# Patient Record
Sex: Female | Born: 1935 | Race: White | Hispanic: No | Smoking: Never smoker
Health system: Southern US, Community
[De-identification: ages and names within clinical notes are randomized; demographics above are authoritative.]

## PROBLEM LIST (undated history)

## (undated) DIAGNOSIS — W010XXA Fall on same level from slipping, tripping and stumbling without subsequent striking against object, initial encounter: Secondary | ICD-10-CM

## (undated) DIAGNOSIS — Z9889 Other specified postprocedural states: Secondary | ICD-10-CM

## (undated) DIAGNOSIS — I4891 Unspecified atrial fibrillation: Secondary | ICD-10-CM

## (undated) DIAGNOSIS — I10 Essential (primary) hypertension: Secondary | ICD-10-CM

## (undated) DIAGNOSIS — E079 Disorder of thyroid, unspecified: Secondary | ICD-10-CM

## (undated) DIAGNOSIS — C801 Malignant (primary) neoplasm, unspecified: Secondary | ICD-10-CM

## (undated) DIAGNOSIS — I6529 Occlusion and stenosis of unspecified carotid artery: Secondary | ICD-10-CM

## (undated) DIAGNOSIS — M1990 Unspecified osteoarthritis, unspecified site: Secondary | ICD-10-CM

## (undated) DIAGNOSIS — R112 Nausea with vomiting, unspecified: Secondary | ICD-10-CM

## (undated) DIAGNOSIS — I639 Cerebral infarction, unspecified: Secondary | ICD-10-CM

## (undated) DIAGNOSIS — B351 Tinea unguium: Secondary | ICD-10-CM

## (undated) HISTORY — DX: Disorder of thyroid, unspecified: E07.9

## (undated) HISTORY — PX: CATARACT EXTRACTION: SUR2

## (undated) HISTORY — DX: Occlusion and stenosis of unspecified carotid artery: I65.29

## (undated) HISTORY — PX: REPLACEMENT TOTAL KNEE BILATERAL: SUR1225

## (undated) HISTORY — PX: APPENDECTOMY: SHX54

## (undated) HISTORY — DX: Tinea unguium: B35.1

## (undated) HISTORY — DX: Cerebral infarction, unspecified: I63.9

## (undated) HISTORY — DX: Essential (primary) hypertension: I10

## (undated) HISTORY — DX: Fall on same level from slipping, tripping and stumbling without subsequent striking against object, initial encounter: W01.0XXA

## (undated) HISTORY — DX: Unspecified atrial fibrillation: I48.91

## (undated) HISTORY — PX: MASTECTOMY: SHX3

## (undated) HISTORY — PX: ABDOMINAL HYSTERECTOMY: SHX81

## (undated) HISTORY — DX: Unspecified osteoarthritis, unspecified site: M19.90

## (undated) HISTORY — PX: HERNIA REPAIR: SHX51

## (undated) HISTORY — DX: Malignant (primary) neoplasm, unspecified: C80.1

## (undated) HISTORY — PX: COLON SURGERY: SHX602

---

## 1997-12-15 ENCOUNTER — Other Ambulatory Visit: Admission: RE | Admit: 1997-12-15 | Discharge: 1997-12-15 | Payer: Self-pay | Admitting: Oncology

## 1998-04-14 ENCOUNTER — Ambulatory Visit (HOSPITAL_COMMUNITY): Admission: RE | Admit: 1998-04-14 | Discharge: 1998-04-14 | Payer: Self-pay | Admitting: Interventional Cardiology

## 1998-04-14 ENCOUNTER — Encounter: Payer: Self-pay | Admitting: Interventional Cardiology

## 1998-06-27 ENCOUNTER — Ambulatory Visit (HOSPITAL_COMMUNITY): Admission: RE | Admit: 1998-06-27 | Discharge: 1998-06-28 | Payer: Self-pay | Admitting: General Surgery

## 1998-06-27 ENCOUNTER — Encounter: Payer: Self-pay | Admitting: General Surgery

## 1999-10-06 ENCOUNTER — Encounter: Payer: Self-pay | Admitting: *Deleted

## 1999-10-06 ENCOUNTER — Encounter: Admission: RE | Admit: 1999-10-06 | Discharge: 1999-10-06 | Payer: Self-pay | Admitting: *Deleted

## 2000-01-30 ENCOUNTER — Encounter: Payer: Self-pay | Admitting: *Deleted

## 2000-01-30 ENCOUNTER — Encounter: Admission: RE | Admit: 2000-01-30 | Discharge: 2000-01-30 | Payer: Self-pay | Admitting: *Deleted

## 2000-11-26 ENCOUNTER — Other Ambulatory Visit: Admission: RE | Admit: 2000-11-26 | Discharge: 2000-11-26 | Payer: Self-pay | Admitting: Internal Medicine

## 2001-02-05 ENCOUNTER — Encounter: Payer: Self-pay | Admitting: Internal Medicine

## 2001-02-05 ENCOUNTER — Encounter: Admission: RE | Admit: 2001-02-05 | Discharge: 2001-02-05 | Payer: Self-pay | Admitting: Internal Medicine

## 2002-02-06 ENCOUNTER — Encounter: Admission: RE | Admit: 2002-02-06 | Discharge: 2002-02-06 | Payer: Self-pay | Admitting: Internal Medicine

## 2002-02-06 ENCOUNTER — Encounter: Payer: Self-pay | Admitting: Internal Medicine

## 2003-02-15 ENCOUNTER — Encounter: Payer: Self-pay | Admitting: Internal Medicine

## 2003-02-15 ENCOUNTER — Encounter: Admission: RE | Admit: 2003-02-15 | Discharge: 2003-02-15 | Payer: Self-pay | Admitting: Internal Medicine

## 2003-08-21 HISTORY — PX: JOINT REPLACEMENT: SHX530

## 2003-10-06 ENCOUNTER — Inpatient Hospital Stay (HOSPITAL_COMMUNITY): Admission: RE | Admit: 2003-10-06 | Discharge: 2003-10-11 | Payer: Self-pay | Admitting: Specialist

## 2004-02-16 ENCOUNTER — Encounter: Admission: RE | Admit: 2004-02-16 | Discharge: 2004-02-16 | Payer: Self-pay | Admitting: Internal Medicine

## 2004-06-21 ENCOUNTER — Inpatient Hospital Stay (HOSPITAL_COMMUNITY): Admission: RE | Admit: 2004-06-21 | Discharge: 2004-06-25 | Payer: Self-pay | Admitting: Specialist

## 2005-03-01 ENCOUNTER — Encounter: Admission: RE | Admit: 2005-03-01 | Discharge: 2005-03-01 | Payer: Self-pay | Admitting: Internal Medicine

## 2005-12-19 ENCOUNTER — Encounter: Payer: Self-pay | Admitting: Interventional Cardiology

## 2006-03-04 ENCOUNTER — Encounter: Admission: RE | Admit: 2006-03-04 | Discharge: 2006-03-04 | Payer: Self-pay | Admitting: Internal Medicine

## 2007-03-06 ENCOUNTER — Encounter: Admission: RE | Admit: 2007-03-06 | Discharge: 2007-03-06 | Payer: Self-pay | Admitting: Internal Medicine

## 2008-03-08 ENCOUNTER — Encounter: Admission: RE | Admit: 2008-03-08 | Discharge: 2008-03-08 | Payer: Self-pay | Admitting: Internal Medicine

## 2009-03-09 ENCOUNTER — Encounter: Admission: RE | Admit: 2009-03-09 | Discharge: 2009-03-09 | Payer: Self-pay | Admitting: Internal Medicine

## 2010-03-10 ENCOUNTER — Encounter: Admission: RE | Admit: 2010-03-10 | Discharge: 2010-03-10 | Payer: Self-pay | Admitting: Internal Medicine

## 2010-08-20 HISTORY — PX: CAROTID ENDARTERECTOMY: SUR193

## 2010-11-10 ENCOUNTER — Emergency Department (HOSPITAL_COMMUNITY)
Admission: EM | Admit: 2010-11-10 | Discharge: 2010-11-10 | Disposition: A | Discharge status: Home / Self Care | Payer: Medicare Other | Attending: Emergency Medicine | Admitting: Emergency Medicine

## 2010-11-10 ENCOUNTER — Emergency Department (HOSPITAL_COMMUNITY): Payer: Medicare Other

## 2010-11-10 DIAGNOSIS — I509 Heart failure, unspecified: Secondary | ICD-10-CM | POA: Insufficient documentation

## 2010-11-10 DIAGNOSIS — Z79899 Other long term (current) drug therapy: Secondary | ICD-10-CM | POA: Insufficient documentation

## 2010-11-10 DIAGNOSIS — I10 Essential (primary) hypertension: Secondary | ICD-10-CM | POA: Insufficient documentation

## 2010-11-10 DIAGNOSIS — M545 Low back pain, unspecified: Secondary | ICD-10-CM | POA: Insufficient documentation

## 2010-11-21 ENCOUNTER — Other Ambulatory Visit: Payer: Self-pay | Admitting: Internal Medicine

## 2010-11-21 DIAGNOSIS — H34231 Retinal artery branch occlusion, right eye: Secondary | ICD-10-CM

## 2010-11-22 ENCOUNTER — Ambulatory Visit
Admission: RE | Admit: 2010-11-22 | Discharge: 2010-11-22 | Disposition: A | Discharge status: Home / Self Care | Payer: Medicare Other | Source: Ambulatory Visit | Attending: Internal Medicine | Admitting: Internal Medicine

## 2010-11-22 ENCOUNTER — Other Ambulatory Visit: Payer: Self-pay | Admitting: Internal Medicine

## 2010-11-22 DIAGNOSIS — H34231 Retinal artery branch occlusion, right eye: Secondary | ICD-10-CM

## 2010-12-01 ENCOUNTER — Other Ambulatory Visit (INDEPENDENT_AMBULATORY_CARE_PROVIDER_SITE_OTHER): Payer: Medicare Other

## 2010-12-01 DIAGNOSIS — I6529 Occlusion and stenosis of unspecified carotid artery: Secondary | ICD-10-CM

## 2010-12-04 ENCOUNTER — Encounter (INDEPENDENT_AMBULATORY_CARE_PROVIDER_SITE_OTHER): Payer: Medicare Other | Admitting: Surgery

## 2010-12-04 DIAGNOSIS — I6529 Occlusion and stenosis of unspecified carotid artery: Secondary | ICD-10-CM

## 2010-12-04 NOTE — Procedures (Unsigned)
CAROTID DUPLEX EXAM  INDICATION:  Right retinal artery occlusion, followup carotid disease.  HISTORY: Diabetes:  No. Cardiac:  No. Hypertension:  Yes. Smoking:  Previous tobacco use. Previous Surgery:  No. CV History:  Right eye blindness. Amaurosis Fugax No, Paresthesias No, Hemiparesis No                                      RIGHT             LEFT Brachial systolic pressure:         140               146 Brachial Doppler waveforms:         WNL               WNL Vertebral direction of flow:        Antegrade         Antegrade DUPLEX VELOCITIES (cm/sec) CCA peak systolic                   44                67 ECA peak systolic                   341               99 ICA peak systolic                   319               331 ICA end diastolic                   77                75 PLAQUE MORPHOLOGY:                  Heterogeneous / calcific            Heterogeneous / calcific PLAQUE AMOUNT:                      Moderate to severe                  Moderate to severe PLAQUE LOCATION:                    ICA, ECA, CCA     ICA, ECA, CCA  IMPRESSION: 1. 60% to 79% bilateral internal carotid artery stenosis by end     diastolic velocity criteria, image, ratio and peak systolic     velocity suggest worse; plaque is irregular. 2. Abnormal right external carotid artery. 3. Bilateral vertebral arteries are within normal limits.  ___________________________________________ V. Charlena Cross, MD  LT/MEDQ  D:  12/01/2010  T:  12/01/2010  Job:  [id number]

## 2010-12-05 NOTE — Assessment & Plan Note (Signed)
OFFICE VISIT  Huff, Brandy C DOB:  10-10-1917                                       12/04/2010 ZOXWR#:[id number]  REASON FOR VISIT:  Symptomatic right carotid stenosis.  HISTORY:  This is a 77 year old female I am seeing at the request of Dr. Donette Larry for right carotid stenosis in the setting of right retinal artery occlusion.  The patient has had a recent acute right vision problem. She was seen by her ophthalmologist and found to have an acute right retinal artery occlusion.  She was sent for carotid Dopplers which revealed a high-grade stenosis on the right in the 70-90 percentile. The left side is 50%-60%.  The patient states no other symptomatology. She denies numbness or weakness in either extremity.  She denies slurred speech.  She denies mentation problems.  The patient suffers from hypertension which is medically managed.  She is not on cholesterol medications.  Per the patient her cholesterol has been excellent.  She has had multiple surgeries in the past including 2 operations for colon cancer as well as breast cancer.  She is very independent.  She denies having any cardiac issues.  REVIEW OF SYSTEMS:  ENT:  Positive for recent change in eyesight. MUSCULOSKELETAL:  Positive for arthritis. All other review of systems are negative.  PAST MEDICAL HISTORY:  Hypertension, hypothyroidism, osteoarthritis, history of breast cancer, history of colon cancer, atrial fibrillation now in normal sinus rhythm, skin cancer, right retinal artery occlusion.  FAMILY HISTORY:  Positive for heart murmur in her sister.  SOCIAL HISTORY:  She dips snuff, does not drink alcohol.  ALLERGIES:  Are none.  PHYSICAL EXAMINATION:  Heart rate 87, blood pressure 121/66 on the right, 138/81 on the left, O2 sats are 97%.  General:  She is well- appearing, in no distress.  HEENT:  She has decreased to no vision in her right eye.  Extraocular movements are intact.   Tongue is midline. Lungs:  Are clear bilaterally.  Cardiovascular:  Regular rate and rhythm.  No murmur.  No carotid bruits.  Abdomen:  Soft and nontender. Her aorta is palpable but feels of normal caliber.  Musculoskeletal: Without major deformities.  Neurological:  No focal deficits except for right vision loss.  Skin:  Without rash.  DIAGNOSTIC STUDIES:  The patient comes with ultrasound from [facility].  This shows high-grade right carotid stenosis on the 70%-90% range, 50%-60% on the left.  ASSESSMENT:  Symptomatic right carotid stenosis.  PLAN:  I had a long discussion today with the patient and her daughter regarding the risks and benefits of surgery.  In the setting of her current symptoms and known high-grade right carotid stenosis, her stroke rate would be decreased if we proceed with carotid endarterectomy.  We discussed possible medical management.  However, with the risks and benefits we have decided to proceed with carotid endarterectomy on the right.  I discussed the risk of stroke, the risk of nerve injury, the risk of cardiopulmonary complications, infection and bleeding.  All of her questions were answered.  She wishes to proceed.  I have scheduled her operation for this Friday, April 20.  She is going to continue on her aspirin.    [name], MD Electronically Signed  VWB/MEDQ  D:  12/04/2010  T:  12/05/2010  Job:  [id number]  cc:   Georgann Housekeeper, MD  Dr Grayling Congress

## 2010-12-06 ENCOUNTER — Encounter (HOSPITAL_COMMUNITY)
Admission: RE | Admit: 2010-12-06 | Discharge: 2010-12-06 | Disposition: A | Discharge status: Home / Self Care | Payer: Medicare Other | Source: Ambulatory Visit | Attending: Surgery | Admitting: Surgery

## 2010-12-06 ENCOUNTER — Other Ambulatory Visit: Payer: Self-pay | Admitting: Surgery

## 2010-12-06 ENCOUNTER — Ambulatory Visit (HOSPITAL_COMMUNITY)
Admission: RE | Admit: 2010-12-06 | Discharge: 2010-12-06 | Disposition: A | Discharge status: Home / Self Care | Payer: Medicare Other | Source: Ambulatory Visit | Attending: Surgery | Admitting: Surgery

## 2010-12-06 DIAGNOSIS — I6529 Occlusion and stenosis of unspecified carotid artery: Secondary | ICD-10-CM | POA: Insufficient documentation

## 2010-12-06 DIAGNOSIS — Z01818 Encounter for other preprocedural examination: Secondary | ICD-10-CM | POA: Insufficient documentation

## 2010-12-06 DIAGNOSIS — J449 Chronic obstructive pulmonary disease, unspecified: Secondary | ICD-10-CM | POA: Insufficient documentation

## 2010-12-06 DIAGNOSIS — I6521 Occlusion and stenosis of right carotid artery: Secondary | ICD-10-CM

## 2010-12-06 DIAGNOSIS — J4489 Other specified chronic obstructive pulmonary disease: Secondary | ICD-10-CM | POA: Insufficient documentation

## 2010-12-06 DIAGNOSIS — Z01811 Encounter for preprocedural respiratory examination: Secondary | ICD-10-CM | POA: Insufficient documentation

## 2010-12-06 DIAGNOSIS — Z01812 Encounter for preprocedural laboratory examination: Secondary | ICD-10-CM | POA: Insufficient documentation

## 2010-12-06 LAB — CBC
MCH: 32.4 pg (ref 26.0–34.0)
MCV: 94.5 fL (ref 78.0–100.0)
Platelets: 187 10*3/uL (ref 150–400)
RDW: 13.6 % (ref 11.5–15.5)

## 2010-12-06 LAB — URINALYSIS, ROUTINE W REFLEX MICROSCOPIC
Bilirubin Urine: NEGATIVE
Glucose, UA: NEGATIVE mg/dL
Ketones, ur: NEGATIVE mg/dL
pH: 5 (ref 5.0–8.0)

## 2010-12-06 LAB — COMPREHENSIVE METABOLIC PANEL
ALT: 17 U/L (ref 0–35)
Calcium: 9.8 mg/dL (ref 8.4–10.5)
Creatinine, Ser: 0.7 mg/dL (ref 0.4–1.2)
GFR calc Af Amer: >60 mL/min (ref >60)
Glucose, Bld: 106 mg/dL — ABNORMAL HIGH (ref 70–99)
Sodium: 136 mEq/L (ref 135–145)
Total Protein: 6.4 g/dL (ref 6.0–8.3)

## 2010-12-06 LAB — TYPE AND SCREEN
ABO/RH(D): O POS
Antibody Screen: NEGATIVE

## 2010-12-06 LAB — URINE MICROSCOPIC-ADD ON

## 2010-12-06 LAB — SURGICAL PCR SCREEN
MRSA, PCR: NEGATIVE
Staphylococcus aureus: NEGATIVE

## 2010-12-08 ENCOUNTER — Inpatient Hospital Stay (HOSPITAL_COMMUNITY)
Admission: RE | Admit: 2010-12-08 | Discharge: 2010-12-09 | DRG: 039 | Disposition: A | Discharge status: Home / Self Care | Payer: Medicare Other | Source: Ambulatory Visit | Attending: Surgery | Admitting: Surgery

## 2010-12-08 ENCOUNTER — Other Ambulatory Visit: Payer: Self-pay | Admitting: Surgery

## 2010-12-08 DIAGNOSIS — I6529 Occlusion and stenosis of unspecified carotid artery: Secondary | ICD-10-CM

## 2010-12-08 DIAGNOSIS — I10 Essential (primary) hypertension: Secondary | ICD-10-CM | POA: Diagnosis present

## 2010-12-08 DIAGNOSIS — I4891 Unspecified atrial fibrillation: Secondary | ICD-10-CM | POA: Diagnosis present

## 2010-12-08 DIAGNOSIS — E039 Hypothyroidism, unspecified: Secondary | ICD-10-CM | POA: Diagnosis present

## 2010-12-09 LAB — BASIC METABOLIC PANEL
BUN: 9 mg/dL (ref 6–23)
CO2: 29 mEq/L (ref 19–32)
Calcium: 8.3 mg/dL — ABNORMAL LOW (ref 8.4–10.5)
Chloride: 101 mEq/L (ref 96–112)
Creatinine, Ser: 0.55 mg/dL (ref 0.4–1.2)
GFR calc Af Amer: >60 mL/min (ref >60)

## 2010-12-09 LAB — CBC
MCH: 32 pg (ref 26.0–34.0)
MCHC: 33.8 g/dL (ref 30.0–36.0)
MCV: 94.8 fL (ref 78.0–100.0)
Platelets: 136 10*3/uL — ABNORMAL LOW (ref 150–400)
RBC: 3.09 MIL/uL — ABNORMAL LOW (ref 3.87–5.11)

## 2010-12-25 ENCOUNTER — Ambulatory Visit (INDEPENDENT_AMBULATORY_CARE_PROVIDER_SITE_OTHER): Payer: Medicare Other | Admitting: Surgery

## 2010-12-25 DIAGNOSIS — I6529 Occlusion and stenosis of unspecified carotid artery: Secondary | ICD-10-CM

## 2010-12-25 NOTE — Assessment & Plan Note (Signed)
OFFICE VISIT  Brandy Huff, [name] DOB:  07-17-1918                                       12/25/2010 ZOXWR#:[id number]  The patient comes back today for followup.  She is status post right carotid endarterectomy with patch angioplasty on 12/08/2010 done in the setting of symptomatic stenosis.  She suffered central retinal artery occlusion and blindness from right carotid stenosis.  Her postoperative course was uncomplicated.  She has done exceptionally well.  She did not require any pain medicine.  She is back to her baseline.  She still has vision loss in her right eye but has no other neurologic deficits.  On examination her incision is well-healed.  She is neurologically intact.  I reiterated as I did before her operation that I think the return of her vision is unlikely and that this operation was done for future stroke prevention.  She has done very well.  We will continue to monitor her with surveillance ultrasound.  I will see her back in 6 months.    [name], MD Electronically Signed  VWB/MEDQ  D:  12/25/2010  T:  12/25/2010  Job:  [id number]  cc:   Georgann Housekeeper, MD Dr Grayling Congress

## 2010-12-26 NOTE — H&P (Signed)
  NAME:  Brandy, Huff             ACCOUNT NO.:  [id number]  MEDICAL RECORD NO.:  [id number]           PATIENT TYPE:  I  LOCATION:  3304                         FACILITY:  [facility]  PHYSICIAN:  Juleen China IV, MDDATE OF BIRTH:  Jan 12, 1918  DATE OF ADMISSION:  12/08/2010 DATE OF DISCHARGE:  12/09/2010                             HISTORY & PHYSICAL   CHIEF COMPLAINT:  Symptomatic right carotid stenosis.  HISTORY OF PRESENT ILLNESS:  [name] is a 77 year old woman who initially presented with central retinal artery occlusion.  She had undergone duplex ultrasound which revealed critical right carotid stenosis.  This was presumed to be the etiology of her vision loss and therefore she was admitted for right carotid endarterectomy to lower her future stroke risk.  PAST MEDICAL HISTORY: 1. Hypertension. 2. Hypothyroidism. 3. Osteoarthritis. 4. History of breast cancer. 5. History of colon cancer. 6. Atrial fibrillation. 7. Skin cancer. 8. Right retinal artery occlusion.  HOSPITAL COURSE:  The patient was taken to the operating room on December 08, 2010, for right carotid endarterectomy with bovine patch angioplasty.  The patient did well.  Neurologically, she remained intact.  She was alert and oriented x3.  She had good and equal strength bilateral upper and lower extremities.  She had no tongue deviation.  No facial droop.  Vital signs were stable.  Laboratory values were stable and she was discharged to home on December 09, 2010.  FINAL DIAGNOSIS:  Symptomatic right carotid stenosis status post right carotid endarterectomy.  All of her other medical issues were stable while in house controlled with her present medications.  DISPOSITION:  The patient was discharged to home.  She will follow up with Dr. [name] in 2 weeks.  DISCHARGE MEDICATIONS: 1. Aspirin 325 mg daily. 2. Tylenol 325 twice daily as needed for pain. 3. PreserVision over-the-counter twice daily. 4.  Potassium chloride 20 mEq daily. 5. Lisinopril 5 mg daily. 6. Levothroid 125 mcg daily. 7. Hydrochlorothiazide 25 mg daily. 8. Fish oil 1200 mg daily. 9. Digoxin 0.125 mg daily. 10.Calcium carbonate vitamin D daily. 11.Percocet 5/325 1 tablet every 4 hours as needed for pain,     prescription for 20 tablets was given.     Della Goo, PA-C   ______________________________ Seth Bake. Charlena Cross, MD    RR/MEDQ  D:  12/19/2010  T:  12/20/2010  Job:  [id number]  Electronically Signed by Della Goo PA on 12/26/2010 01:36:00 PM

## 2011-01-01 NOTE — Op Note (Signed)
NAME:  Brandy Huff, Brandy Huff             ACCOUNT NO.:  [id number]  MEDICAL RECORD NO.:  [id number]           PATIENT TYPE:  I  LOCATION:  3304                         FACILITY:  [facility]  PHYSICIAN:  Juleen China IV, MDDATE OF BIRTH:  [birth year]  DATE OF PROCEDURE:  12/08/2010 DATE OF DISCHARGE:                              OPERATIVE REPORT   PREOPERATIVE DIAGNOSIS:  Symptomatic right carotid stenosis.  POSTOPERATIVE DIAGNOSIS:  Symptomatic right carotid stenosis.  PROCEDURE PERFORMED:  Right carotid endarterectomy with bovine pericardial patch angioplasty.  SURGEON: 1. Charlena Cross, MD  ASSISTANT:  Della Goo, PA-C  ANESTHESIA:  General.  BLOOD LOSS:  100 mL.  FINDINGS:  80% stenosis.  No thrombus.  INDICATIONS:  This is a 77 year old female who initially presented with central retinal artery occlusion.  She has undergone duplex ultrasound which reveals right carotid stenosis.  This was presumed to be theetiology of her vision loss and therefore she comes in today for right carotid endarterectomy to lower her future stroke rate.  PROCEDURE:  The patient was identified in the holding area and taken to room 8, placed supine on the table.  General anesthesia was administered.  The patient was prepped and draped in usual fashion.  A time-out was called.  Antibiotics were given.  An incision was made along the anterior border of the right sternocleidomastoid.  Cautery was used to divide the subcutaneous tissue.  The platysma muscle was divided with cautery.  The internal jugular vein was identified and reflected laterally.  I dissected straight down to the common carotid artery which was circumferentially dissected free and encircled with an umbilical tape.  The vagus nerve was visualized posterior to the wound and protected.  I then proceeded with cephalad dissection, I encountered two branches up the facial vein each of which were ligated between 2-0 silk ties and  metal clips.  The external carotid artery was circumferentially dissected free and encircled with a blue vessel loop.  I then proceeded with dissection of the internal carotid artery.  I did identify the hypoglossal nerve which was rather small and up under the posterior belly of the digastric.  It was well away from the distal end disease- free internal carotid artery which was dissected out and encircled with an umbilical tape.  After I was satisfied with exposure, the patient was given systemic heparinization.  After the heparin circulated, the internal carotid artery was occluded initially with a baby Gregory clamp.  The common and external carotid arteries were also occluded.  A #11 blade was used to make an arteriotomy which was extended longitudinally on the anterolateral surface of the common and internal carotid artery.  There was approximately an 80% stenosis.  There were multiple small particles which most likely were the source of her embolic event.  There was no thrombus.  A 10-French shunt was then inserted.  Endarterectomy was performed using a Kleinert Administrator, arts. Eversion endarterectomy was performed from the external carotid artery. A good distal endpoint was obtained and the plaque was removed and sent as a specimen.  I did place several 7-0 tacking sutures near the distal endpoint  to make sure there was no mobile flap.  The endarterectomized bed was then copiously irrigated.  All potential embolic debris was removed.  A bovine pericardial patch was selected.  A patch angioplasty was performed using the bovine pericardial patch and a 6-0 Prolene. Prior to completion of the patch repair, the shunt was removed.  The internal, common and external carotid arteries were all appropriately flushed.  The artery was then irrigated again with heparin saline.  The anastomosis was then completed.  The clamp on the external carotid artery was then released initially followed by  the common carotid clamp. Approximately, 1 minute later blood flow was reestablished through the internal carotid artery.  Handheld Doppler was used to evaluate signals within the common, external and internal carotid arteries.  They all had appropriate signals.  Next, the patient's heparin was reversed with 50 mg of protamine.  After I was satisfied with hemostasis, the carotid sheath was reapproximated with a 3-0 Vicryl, the platysma muscle closed with 3-0 Vicryl, the skin was closed with 4-0 Vicryl and Dermabond was placed on the wound.     [name], MD     VWB/MEDQ  D:  12/08/2010  T:  12/08/2010  Job:  [id number]  Electronically Signed by Arelia Longest IV MD on 01/01/2011 10:57:51 PM

## 2011-01-05 NOTE — Discharge Summary (Signed)
NAME:  Brandy Huff, Brandy Huff                       ACCOUNT NO.:  [id number]   MEDICAL RECORD NO.:  [id number]                   PATIENT TYPE:  INP   LOCATION:  0469                                 FACILITY:  [facility]   PHYSICIAN:  Jene Every, M.D.                 DATE OF BIRTH:  [birth year]   DATE OF ADMISSION:  10/06/2003  DATE OF DISCHARGE:  10/11/2003                                 DISCHARGE SUMMARY   ADMISSION DIAGNOSES:  1. Degenerative joint disease right knee.  2. Question of paroxysmal atrial fibrillation.  3. Hypothyroidism.  4. Hypertension.  5. Cataracts.   DISCHARGE DIAGNOSES:  1. Degenerative joint disease of the right knee status post right total knee     arthroplasty.  2. Question of paroxysmal atrial fibrillation.  3. Hypothyroidism.  4. Hypertension.  5. Cataracts.  6. Postoperative anemia.  7. Hypokalemia.  8. Hyponatremia.   CONSULTATIONS:  PT/OT.   PRIMARY CARE PHYSICIAN:  Georgann Housekeeper, M.D.   PROCEDURES:  The patient was taken to the OR on October 06, 2003 to undergo  a right total knee arthroplasty.  Surgeon: Jene Every, M.D.  Assistant:  [name], P.A.-C.  Anesthesia:  General.  Operative complications:  None.   PREOPERATIVE LABORATORY VALUES:  An H&H of 14.9 and 41.7.  Routine H&H's  were followed throughout the hospital course.  The patient ____________ of  hemoglobin to a value of 11.5, hematocrit 33.1.  Coagulation studies done  preoperatively showed a PT 12, INR 0.8, PTT of 28.  These were followed  throughout the hospital course.  At the time of discharge PT on Coumadin  ____________ with a PT of 14.3, INR of _________.  Routine chemistries done  preoperatively showed sodium 139, potassium 4, with glucose 103.  These were  monitored throughout ___________.  Sodium at the time of discharge was 134  and potassium 3.9 with a glucose of 113.  Urinalysis done preoperatively was  clear without evidence of infection.  A repeat urinalysis  done during  hospitalization showed amber color with a turbid appearance with a small  amount of hemoglobin, trace ketones with 7-10 rbc's noted with hyaline casts  on a microscopic exam.  Preoperatively chest x-ray shows mild cardiomegaly  and COPD; however, no active disease was noted.  I do not see a preoperative  EKG in the chart.   HISTORY:  [name] is an 77 year old female with a history of severe  osteoarthritis bilateral knees.  The patient underwent multiple conservative  therapies including cortisone injections as well as anesthesia's without any  significant relief of symptoms.  She describes her knee pain as ___________  very disabling.  X-ray studies show end-stage osteoarthritis with complete  collapse of the medial joint space.  It it felt at this time the patient  could benefit from a total knee arthroplasty.  The risks and benefits of the  surgery were discussed with the patient.  She wishes to proceed.  The  patient did receive medical clearance from Georgann Housekeeper, M.D. prior to  scheduling the operative surgery.   HOSPITAL COURSE:  The patient was admitted, taken to the OR, underwent the  above-stated procedure without difficulty.  At time of surgery one Hemovac  drain was placed; however, prior to the transfer to the PACU, the drain was  pulled out.  The patient was placed on PC analgesics, transferred to the  PACU and then to the orthopedic floor for continued postoperative care.  Coumadin was initiated for DVT prophylaxis, dose per pharmacy.  The  patient's medical physician was called to assist with all her medical care  while she was an inpatient.  Postoperatively the patient did well.  CPM was  held due to the concern with the swelling in the knee due to the lack of the  Hemovac drain.  The patient did have an episode of mild hyponatremia and  mild hypokalemia which was treated accordingly.  This resolved without  incident.  Incision remained clean and dry  with minimal effusion in the  knee.  Calves remained soft and nontender without evidence of DVT.  The  patient advanced well with physical therapy.  On postoperative day #4, she  was ambulating 120 feet without significant difficulty.  She had good p.o.  intake with good output.  The patient had a slight drop in her hemoglobin.  At the time showed 11.5 with hematocrit of 33.1; however, was asymptomatic.  Postoperative day #5 it was felt the patient was stable to be discharged  home with home health arrangements made by care management.  She is to  follow up with Dr. Shelle Iron in approximately 1 week for reevaluation.  She is  instructed in daily dressing changes as well as okay to shower.   DIET:  As tolerated.   ACTIVITY:  Weightbearing as tolerated with home CPM and home health.  She is  to wear a knee immobilizer until she can straight leg raise.   DISCHARGE MEDICATIONS:  Includes all home medications as well as Percocet,  Robaxin, Coumadin with dose as per pharmacy, Trinsicon.   CONDITION ON DISCHARGE:  Stable.   FINAL DIAGNOSIS:  Doing well status post right total knee arthroplasty.     [name], P.A.                   Jene Every, M.D.    CS/MEDQ  D:  10/27/2003  T:  10/28/2003  Job:  [id number]

## 2011-01-05 NOTE — H&P (Signed)
NAME:  Brandy, Huff NO.:  [id number]   MEDICAL RECORD NO.:  [id number]                  PATIENT TYPE:   LOCATION:                                       FACILITY:   PHYSICIAN:  Jene Every, M.D.                 DATE OF BIRTH:   DATE OF ADMISSION:  10/06/2003  DATE OF DISCHARGE:                                HISTORY & PHYSICAL   CHIEF COMPLAINT:  Right knee pain.   HISTORY:  [name] is an 77 year old female with a history of severe  osteoarthritis of bilateral knees.  The patient has underwent multiple  Cortisone injections with little relief of her symptoms.  She had been  putting off any surgical intervention, however, her knee pain has gotten to  the point where it is very disabling.  X-rays reveal end-stage  osteoarthritis with complete collapse of the medial joint space.  It is felt  that the patient would benefit from a total knee arthroplasty.  The risks  and benefits of the surgery were discussed with the patient.  She wishes to  proceed.  The patient did receive medical clearance from Dr. Georgann Housekeeper  prior to scheduling of her surgery.   PAST MEDICAL HISTORY:  1. Questionable paroxysmal atrial fibrillation.  2. Hypothyroidism.  3. Hypertension.  4. Cataracts.   CURRENT MEDICATIONS:  1. HCTZ 25 mg one p.o. daily.  2. Potassium 20 mEq one p.o. daily.  3. Synthroid 100 mcg one p.o. daily.  4. Lanoxin 0.125 mg one p.o. daily.  5. Reserpine 0.25 mg 1/2 tab p.o. daily.   PAST SURGICAL HISTORY:  1. Hysterectomy.  2. Appendectomy.  3. Colectomy for colon cancer x2.  4. Right breast mastectomy.  5. Cholecystectomy.  6. Left inguinal hernia repair.   FAMILY HISTORY:  Father passed away from a motor vehicle accident.  Mother  passed of heart trouble in her 25s.  Sister has questionable rheumatic heart  disease.  One sister deceased from old age.   SOCIAL HISTORY:  The patient is married.  The patient does use smokeless  tobacco.  No history of alcohol use.   REVIEW OF SYSTEMS:  GENERAL:  The patient denies any fevers, chills, night  sweats, or bleeding tendencies.  CNS:  No blurry or double vision, seizures,  headaches, or paralysis.  RESPIRATORY:  No shortness of breath, productive  cough, or hemoptysis.  CARDIOVASCULAR:  No chest pain, angina, orthopnea.  GENITOURINARY:  No dysuria, hematuria, or discharge.  GASTROINTESTINAL:  No  nausea, vomiting, diarrhea, constipation, melena, bloody stools.  MUSCULOSKELETAL:  Pertinent to HPI.   PHYSICAL EXAMINATION:  GENERAL:  This is a well-developed, well-nourished 53-  year-old female in mild distress.  VITAL SIGNS:  Pulse is 70 and slightly irregular, respiratory rate 18, blood  pressure 130/68.  HEENT:  Normocephalic, atraumatic.  Pupils equal, round, reactive to light.  EOMs intact.  NECK:  Supple, no lymphadenopathy.  CHEST:  Clear to auscultation bilaterally, no wheezes, rhonchi, or rales.  CARDIOVASCULAR:  Regular rate and rhythm without murmurs, rubs, or gallops.  ABDOMEN:  Soft, nontender, nondistended, bowel sounds x4.  SKIN:  No rashes or lesions are noted.  EXTREMITIES:  The patient had a valgus deformity on the right with  tenderness to palpation of the medial joint line.  There is mild to moderate  crepitus on exam.  X-rays do show complete collapse of the medial joint  space.   IMPRESSION:  Degenerative joint disease, right knee.   PLAN:  The patient will be admitted to undergo a right total knee  arthroplasty.  Medical clearance again was obtained by Dr. Georgann Housekeeper.     [name], P.A.                   Jene Every, M.D.    CS/MEDQ  D:  09/27/2003  T:  09/27/2003  Job:  [id number]

## 2011-01-05 NOTE — H&P (Signed)
NAME:  Brandy Huff, Brandy Huff             ACCOUNT NO.:  [id number]   MEDICAL RECORD NO.:  [id number]          PATIENT TYPE:  INP   LOCATION:  NA                           FACILITY:  [facility]   PHYSICIAN:  Jene Every, M.D.    DATE OF BIRTH:  28-Sep-1917   DATE OF ADMISSION:  DATE OF DISCHARGE:                                HISTORY & PHYSICAL   CHIEF COMPLAINT:  Left knee pain.   HISTORY OF PRESENT ILLNESS:  Brandy Huff is an 77 year old female with  longstanding history of bilateral knee pain.  Last year, she did undergo a  right total knee arthroplasty and did very well with this.  She requests  today to proceed with a left total knee arthroplasty.  Risks and benefits of  the surgery were discussed with the patient.  She did receive clearance from  Dr. Georgann Housekeeper.  X-rays did show end-stage osteoarthritis.  At this  point, the patient wishes to proceed with surgery as discussed above.   PAST MEDICAL HISTORY:  1.  Paroxysmal atrial fibrillation.  2.  Hypothyroidism.  3.  Hypertension.  4.  Cataracts.   CURRENT MEDICATIONS:  1.  HCTZ 25 mg 1 p.o. daily.  2.  Potassium 20 mEq 1 p.o. daily.  3.  Synthroid 100 mcg 1 p.o. daily.  4.  Lanoxin 0.125 mg 1 p.o. daily.  5.  Reserpine 0.25 mg 1/2 p.o. daily.   PAST SURGICAL HISTORY:  1.  Hysterectomy.  2.  Appendectomy.  3.  Colectomy for colon cancer x 2.  4.  Right breast mastectomy.  5.  Cholecystectomy.  6.  Left inguinal hernia repair.   FAMILY HISTORY:  Mother had history of coronary artery disease, passed at  age 80.  Father deceased secondary to motor vehicle accident.  Sister has  questionable rheumatic heart disease.   SOCIAL HISTORY:  The patient is married.  She does use smokeless tobacco.  No history of alcohol intake.   REVIEW OF SYSTEMS:  GENERAL:  The patient denies any fever, chills, night  sweats, or bleeding tendency.  CNS:  No blurred or double vision, seizure,  headache, or paralysis.  RESPIRATORY:  No  shortness of breath, productive  cough, or hemoptysis.  CARDIOVASCULAR:  No chest pain, angina, orthopnea.  GU:  No dysuria, hematuria, or discharge.  GI:  No nausea, vomiting,  diarrhea, constipation, melena, or bloody stools.  MUSCULOSKELETAL:  Pertinent to HPI.   PHYSICAL EXAMINATION:  VITAL SIGNS:  Pulse is 76, respiratory 16, BP 140/68  in left upper extremity.  GENERAL:  This is a well-developed, well-nourished, 77 year old female  sitting upright on the table in no acute distress.  HEENT:  Atraumatic, normocephalic.  Pupils equal, round, reactive to light.  EOM is intact.  NECK:  Supple, no lymphadenopathy.  CHEST:  Clear to auscultation bilaterally.  No rhonchi, wheezes, or rales.  HEART:  Regular rate and rhythm without murmurs, gallops, or rubs.  ABDOMEN:  Soft, nontender, nondistended.  Bowel sounds all four.  SKIN:  No rashes or lesions are noted.  EXTREMITIES:  The patient does have a mild effusion on  the left knee with  decreased range of motion.  She is tender to palpation on the medial and  lateral joint line.  Calf soft, nontender.   IMPRESSION:  Degenerative joint disease, left knee.   PLAN:  The patient will be admitted to [facility] to undergo a  left total knee arthroplasty.     Chri   CS/MEDQ  D:  06/20/2004  T:  06/20/2004  Job:  [id number]

## 2011-01-05 NOTE — Op Note (Signed)
NAME:  Brandy Huff, Brandy Huff                       ACCOUNT NO.:  [id number]   MEDICAL RECORD NO.:  [id number]                   PATIENT TYPE:  INP   LOCATION:  0002                                 FACILITY:  [facility]   PHYSICIAN:  Jene Every, M.D.                 DATE OF BIRTH:  [birth year]   DATE OF PROCEDURE:  DATE OF DISCHARGE:                                 OPERATIVE REPORT   PREOPERATIVE DIAGNOSIS:  Degenerative joint disease, right knee.   POSTOPERATIVE DIAGNOSIS:  Degenerative joint disease, right knee.   PROCEDURE PERFORMED:  Right total knee arthroplasty.   ANESTHESIA:  General.   ASSISTANT:  Trainor.   Briefly, this is the case of an 77 year old with refractory end-stage  osteoporosis of the knee.  Operative intervention is indicated for  replacement by total knee replacement.  Patient had been refractory to  conservative treatment with disabling pain, despite corticosteroid  injections and rest.  Felt to be a satisfactory candidate for a total knee  replacement by her medical physician.  Despite her age, she is fairly active  and disabled by her symptoms.  The risks and benefits have been discussed,  including bleeding, infection, DVT or PE, suboptimal range of motion, the  need for revision, etc.   TECHNIQUE:  With the patient in supine position after adequate general  endotracheal anesthesia and 1 gm of Kefzol.  The right lower extremity was  prepped and draped in the usual sterile fashion with side tourniquet  inflated to 350 mmHg.  A midline incision was made over the knee, and the  subcutaneous tissue was dissected, cautery was used to achieve hemostasis.  Full-thickness flaps were developed from the medial side.  A medial  parapatellar arthrotomy was performed.  The patella was everted, and the  knee was flexed.  There was severe tricompartmental osteoporosis,  particularly in the median compartment.  Osteophytes were removed with a  rongeur.  There was no ACL or  medial meniscus.  Remnants of the medial and  lateral meniscus were removed.  A step drill was utilized down to her  femoral canal.  It was irrigated.  The systolic blood pressure was greater  than 120 prior to this.  The intramedullary guide was placed 5 degrees on  the right.  It was transfixed to the distal femur, 10 mm from the distal  femur.  The soft tissues were well protected.  We then placed a distal  femoral sizing jig, bisecting the notch along the intercondylar axis with a  sizing probe, sizing it to a #7.  This was then pinned.  The distal femoral  cutting block was then applied anteriorly and posteriorly as well as chamfer  cuts were performed with an oscillating saw.  Soft tissue was well  protected.  Next, the tibia was subluxated.  An oscillating saw was utilized  to remove the tibial spine.  The remainder of the menisci were  removed.  The  base plate was trialed to a 7.  A bushing was utilized.  The step drill was  utilized to enter the tibial canal.  An internal alignment guide was placed.  An intramedullary rod was placed.  Placed a sizing guide 4 mm off the medial  defect.  This was pinned with an external alignment guide medial to the  tibial tubercle parallel to the anterior aspect of the tibia, bisecting the  medial malleoli.  The tibial cutting jig was then transfixed.  The  intramedullary guide removed.  Soft tissues were well protected.  The  oscillating saw was utilized to perform a tibial cut.  Following this, we  placed her in a full extension.  There was an equal flexion and extension  gap.  All posterior osteophytes were removed, inspected, and copiously  irrigated once again.  The knee was flexed and placed upon the distal femur.  The box cutter guide, which cut a patellofemoral groove, then the box cutter  utilizer was removed the box and the remnant of the PCL, which is first  scored with an oscillating saw and removed the bone rongeur, utilizing the   cutting block and then the impacter.  Next, a trial femur was then placed.  A trial tibia, 10 mm posterior cruciate insert.  She had full extension and  full flexion to 130 degrees.  No lift-off.  With the appropriate external  alignment guide in flexion and extension, we marked the base plate.  This  was then removed.  The tibia was subluxated, and the base plate applied with  pins.  The chamfer punches were then utilized after the external alignment  pins were removed from the tibia.  No difficulty was noted here.  The  patella was trialed to a 26.  This was then pinned.  This was then reamed to  10 mm in depth.  The peg holes were reamed as well.  Osteophytes have been  removed from the patella as well.   All instrumentation was removed and inspected posteriorly.  No evidence of  residual or impinging osteophytes.  The wound was copiously irrigated with  pulsatile lavage while the cement was being mixed on the back table.  After  the appropriate soft tissue lavage by pulsatile lavage, placed a clean down  sheet.  Flexed and subluxed the tibia.  Placed a ___________ and trialed the  intramedullary canal of the proximal tibia.  Placed Gelfoam down the distal  canal.  After the appropriate partial curing of the cement, we injected into  the intramedullary and onto the tibia, packing it into the cancellous bone.  Next, the tibia #7 tray was then impacted with redundant cement removed.  Dried the end of the femur and placed cement upon that.  Impacted her  femoral component, #7, with cement on the posterior runners, removing the  redundant cement after impaction.  The knee was held in extension.  Axial  load applied.  Redundant cement removed.  We then cemented the patella,  redundant cement removed, and placed a clamp upon the patella.  After  appropriate curing of the cement, removed a trial and flexed and extended. Removed cement from the posterior runners of the femur.  Checked out   posteriorly.  There was no residual or redundant cement.   Next, placed bone locks within the box underneath the femur.  We tried, and  a 10 was optimal.  So then after copious irrigation, placed a 10 mm  polyethylene insert, posterior cruciate stabilizing.  Excellent purchase.  No lift-off.  Full flexion to 140, no lift-off.  Full extension.  Slight  lateral tracking of the patella.  Lateral retinacular release was performed  through appropriate tracking.  Again, good stability with varus and valgus  stress with #1 Vicryl and figure-of-eight sutures.  Placed a Hemovac.  Brought it out through a lateral stab wound on the skin.  _________%  Marcaine with epinephrine was infiltrated into the joints.  After the  patellar arthrotomy was repaired, slight flexion repaired, the subcutaneous  tissue with 2-0 Vicryl simple sutures, the skin was reapproximated with  staples.  The wound was dressed sterilely.  Compressive dressing was  applied.  The tourniquet was deflated, and adequate vascularization of the  lower extremity appreciated.  Just prior to this, we flexed and extended the  knee.  We had her to 130 degrees of full extension.   The patient tolerated the procedure well.  No complications.  Tourniquet  time was one hour and 55 minutes.  Blood loss minimal.                                               Jene Every, M.D.    Cordelia Pen  D:  10/06/2003  T:  10/06/2003  Job:  [id number]

## 2011-01-05 NOTE — Op Note (Signed)
NAMEDULCY, SIDA             ACCOUNT NO.:  [id number]   MEDICAL RECORD NO.:  [id number]          PATIENT TYPE:  INP   LOCATION:  0006                         FACILITY[facility]  [facility]   PHYSICIAN:  Jene Every, M.D.    DATE OF BIRTH:  [birth year]   DATE OF PROCEDURE:  06/21/2004  DATE OF DISCHARGE:                                 OPERATIVE REPORT   PREOPERATIVE DIAGNOSIS:  Degenerative joint disease, left knee.   POSTOPERATIVE DIAGNOSIS:  Degenerative joint disease, left knee.   PROCEDURE PERFORMED:  Left total knee arthroplasty.   ANESTHESIA:  General.   ASSISTANT:  Strader.   COMPONENTS UTILIZED:  Osteonics Scorpio posterior cruciate sacrificing, 5  femur, 5 tibia, 12 insert, 26 patella.   BRIEF HISTORY AND INDICATIONS:  An 77 year old with end-stage  osteoarthritis. Operative intervention is indicated for replacement. Risks  and benefits discussed including bleeding, infection, injury to  neurovascular  structures, suboptimal range of motion,  need for revision,  etc.   patient  supine position. After induction of adequate general anesthesia and  1 g of kefzol , left lower extremity was prepped and draped and  exsanguinated in the usual sterile fashion. A thigh tourniquet was placed at  350 mmHg. Midline incision over the patella. Median parapatellar arthrotomy  performed. Superficial medial collateral ligament elevated. Knee was flexed.  Tibia was subluxed. Tricompartmental osteoarthrosis was noted. Osteophytes  were removed with the rongeur.  The ACL and the menisci were removed as  well. Step drill entered the femur, irrigated, evacuated, intramedullary  guide 5 left placed, 10 mm cut from the distal femur. Tibia then subluxed.  Tibia spine removed with an oscillating saw. Baseplate of 5 applied. Hole  drilled, intramedullary guide placed. It was evacuated and irrigated, 6 mm  off the medial tibial plateau was measured. __________  0 degree cut. This  was transfixed  utilizing the appropriate external alignment guiding parallel  to the tibia __________  malleolus. After transfixation, __________  off the  medial defect was utilized. Proximal tibial cut was performed. Posterior  soft tissues protected at all times. Following this, posterior remnants of  the medial and lateral menisci were removed. Attention turned back towards  the femur. Sized to a 6, we marked for a 6, cut at a 5. The distal femoral  jig applied. Anterior, posterior, chamfer cuts were performed. Posterior  elements were protected at all times. Then chiseled the patellofemoral  groove and removed the __________  to accept the retaining instrument.  Cutter and impacter were utilized. Next, a trial of femur and tibia were  applied with a 12-mm insert folded in slight hyperextension with 140 degrees  of flexion. No lift off. No rotation. This was then marked in the  appropriate rotation utilizing external alignment guide parallel to the  tibia, __________  the ankle, medial to the tibial tubercle and marked. This  was then removed. Patella trialed to a 26. Patellar drill utilized and  drilled 10 mg in depth. Patellar pegs drilled as well. Redundant osteophytes  were removed. With the knee flexed, with the tibia subluxed, pinned the  baseplate, used  the tower punch guide. Two punches were then performed.  Following this, we inspected __________  no residual osteophytes or residual  menisci or PCL. Normal equal flexion extension gap. Wound was copiously  irrigated with pulsatile lavage. Following this, the knee was then flexed,  tibia was subluxed, __________ was placed, tibia dried, cement mixed  appropriately, partially dried. Cement restricter placed in the canal with  Gelfoam. Cement then applied to the tibia and the proximal tibia __________  the 5 tibial component packed into place with redundant cement removed.  Femur was then cemented. Cement placed on the distal femur and on the   runners, impacted, redundant cement removed. Trial placed. The knee was held  in extension __________  applied, redundant cement removed.   The patellar 26 was then cemented into place and held with a compression  device and after appropriate __________  cement and redundant cement  removed, trial had been removed after full extension, full flexion without a  lift off. Good stability of varus and valgus stressing with 0 to 30 degrees.  We selected therefore a 12, removed the trial insert, redundant cement  removed from the joint and from the prosthesis. Wound copiously irrigated  with antibiotic irrigation. We placed the 12 insert and subluxed the tibia,  applied it, seated appropriately, reduced full extension/full flexion, good  stability to varus and valgus stressing. __________  lateral tracking of the  patella was noted; therefore lateral retinacular release performed. Wound  copiously irrigated with antibiotic irrigation. Placed drain, brought out  through a lateral stab wound in the skin. Marcaine with epinephrine was  deposited into the joint. Then repaired the patellar arthrotomy with a #1  interrupted figure-of-eight suture. The subcutaneous tissue reapproximated  with 2-0 Vicryl suture. Skin was reapproximated with staples. Wound was  dressed sterilely. The patient was placed in a compression dressing,  tourniquet was inflated __________  .  The patient tolerated the procedure  with no complication. Tourniquet time was an hour and a half. Assistant  [name], P.A. No complications.     Trey Paula   JB/MEDQ  D:  06/21/2004  T:  06/21/2004  Job:  [id number]

## 2011-01-05 NOTE — Discharge Summary (Signed)
Brandy Huff, Brandy Huff             ACCOUNT NO.:  [id number]   MEDICAL RECORD NO.:  [id number]          PATIENT TYPE:  INP   LOCATION:  0462                         [facility]ACILITY:  [facility]   PHYSICIAN:  Jene Every, M.D.    DATE OF BIRTH:  [birth year]   DATE OF ADMISSION:  06/21/2004  DATE OF DISCHARGE:  06/25/2004                                 DISCHARGE SUMMARY   ADMISSION DIAGNOSES:  1.  Hypothyroidism.  2.  Hypertension.  3.  Cataracts.  4.  Paroxysmal atrial fibrillation.  5.  Degenerative joint disease left knee.   DISCHARGE DIAGNOSES:  1.  Degenerative joint disease left knee status post left total knee      arthroplasty.  2.  Hypothyroidism.  3.  Hypertension.  4.  Cataracts.  5.  Paroxysmal atrial fibrillation.   CONSULTS:  PT/OT.   PROCEDURES:  The patient was taken to the OR on June 21, 2004 to undergo  left total knee arthroplasty.  Surgeon:  Dr. Jene Every.  Assistant:  [name], P.A.-C.  Anesthesia:  General.  Complications:  None.   LABORATORY DATA:  Preoperative CBC showed a white cell count of 7.6,  hemoglobin 14.6, hematocrit 42.5.  Routine CBCs were followed throughout the  hospital course.  The patient did have a slight rise in her white cell count  to 11.8; however, at the time of discharge normalized at 8.8.  Hemoglobin  did drop at time of discharge to a level of 9.9, hematocrit 28.3; however,  the patient was asymptomatic.  Coagulation studies done preoperatively  showed a PT of 12.9, INR of 1.0, PTT of 29.  These were followed throughout  the hospital course.  At the time of discharge, PT is 14.8, INR 1.2.  Routine chemistries obtained preoperatively showed sodium 139, potassium  4.5, glucose of 101, with a normal BUN and creatinine.  The patient did have  a drop in her sodium to a level of 125 which resolved at the time of  discharge to a level of 135.  She also had a slight drop in her potassium to  a level of 3.2; at the time of  discharge she was 3.1.  She did have a slight  elevation of her glucose to a maximum of 146.  However, this had normalized  by time of discharge.  Routine liver function tests showed a slightly  elevated total bilirubin at 1.3.  Urinalysis obtained preoperatively showed  a trace hemoglobin, small leuk esterase, with 36 wbc's seen per high-power  field.  Repeat urinalysis done at time of admission showed trace hemoglobin;  however, microscopic exam was negative.  Blood type is O positive.  Preoperative EKG shows normal sinus rhythm.  I do not see a preoperative  chest x-ray in the chart.   HOSPITAL COURSE:  The patient was admitted, taken to the OR, underwent the  above-stated procedure without difficulty.  One Hemovac drain was placed.  The patient was then transferred to the PACU and then to the orthopedic  floor for continued postoperative care.  Postoperatively, she was placed on  PCA for pain relief.  Coumadin was started for DVT prophylaxis.  Her  discharge planning was initiated.  Postoperatively, the patient did very  well.  She did have  a slight drop in her sodium which was treated and at  the time of discharge had normalized.  The patient advanced well with  physical therapy, was felt she could be discharged home with home health  arrangements made through [place].  The patient did have a slight drop in  her hemoglobin; however, remained asymptomatic from this.  By postoperative  day #4 it was felt the patient was ready to be discharged.  She was  advancing well with therapy, pain was well controlled, she was asymptomatic  from mild postoperative anemia, [facility] had arranged all home health needs.   DISPOSITION:  The patient discharged home with home health needs met by  [place].   DISCHARGE INSTRUCTIONS:  1.  Discharge medications include Tylox p.r.n. pain, Coumadin as dosed per      pharmacy.  2.  The patient is to use her knee immobilizer until she can straight leg       raise.  3.  No daily dressing changes.  4.  Okay for her to shower.  5.  She is to call and follow up with Dr. Shelle Iron in approximately 10-14 days      following her surgery.  6.  Genevieve Norlander will monitor her Coumadin levels.  7.  Diet is as tolerated.   CONDITION ON DISCHARGE:  Stable.   FINAL DIAGNOSIS:  Status post left total knee arthroplasty.     Chri   CS/MEDQ  D:  08/17/2004  T:  08/17/2004  Job:  [id number]

## 2011-03-07 ENCOUNTER — Other Ambulatory Visit: Payer: Self-pay | Admitting: Internal Medicine

## 2011-03-07 DIAGNOSIS — Z1231 Encounter for screening mammogram for malignant neoplasm of breast: Secondary | ICD-10-CM

## 2011-03-07 DIAGNOSIS — Z9011 Acquired absence of right breast and nipple: Secondary | ICD-10-CM

## 2011-03-14 ENCOUNTER — Ambulatory Visit
Admission: RE | Admit: 2011-03-14 | Discharge: 2011-03-14 | Disposition: A | Discharge status: Home / Self Care | Payer: Medicare Other | Source: Ambulatory Visit | Attending: Internal Medicine | Admitting: Internal Medicine

## 2011-03-14 DIAGNOSIS — Z9011 Acquired absence of right breast and nipple: Secondary | ICD-10-CM

## 2011-03-14 DIAGNOSIS — Z1231 Encounter for screening mammogram for malignant neoplasm of breast: Secondary | ICD-10-CM

## 2011-05-09 ENCOUNTER — Encounter: Payer: Self-pay | Admitting: Surgery

## 2011-07-02 ENCOUNTER — Other Ambulatory Visit: Payer: Medicare Other

## 2011-07-02 ENCOUNTER — Ambulatory Visit: Payer: Medicare Other | Admitting: Surgery

## 2011-07-27 ENCOUNTER — Encounter: Payer: Self-pay | Admitting: Surgery

## 2011-07-30 ENCOUNTER — Encounter: Payer: Self-pay | Admitting: Surgery

## 2011-07-30 ENCOUNTER — Ambulatory Visit (INDEPENDENT_AMBULATORY_CARE_PROVIDER_SITE_OTHER): Payer: Medicare Other | Admitting: Surgery

## 2011-07-30 ENCOUNTER — Ambulatory Visit (INDEPENDENT_AMBULATORY_CARE_PROVIDER_SITE_OTHER): Payer: Medicare Other | Admitting: *Deleted

## 2011-07-30 VITALS — BP 127/73 | HR 96 | Resp 16 | Ht 61.0 in | Wt 132.0 lb

## 2011-07-30 DIAGNOSIS — Z48812 Encounter for surgical aftercare following surgery on the circulatory system: Secondary | ICD-10-CM

## 2011-07-30 DIAGNOSIS — I6529 Occlusion and stenosis of unspecified carotid artery: Secondary | ICD-10-CM

## 2011-07-30 NOTE — Progress Notes (Signed)
[facility] [place]   Patient name: Brandy Huff MRN: [id number] DOB: 01/01/1918 Sex: female     Chief Complaint  Patient presents with  . Carotid    6 month follow up with vascular labs today.    HISTORY OF PRESENT ILLNESS: The patient comes back today for followup of her carotid disease. She is status post right carotid endarterectomy with patch angioplasty on 12/08/2010. This was done for symptomatic right carotid stenosis. The patient was found to have central retinal artery occlusion which led to blindness. She continues to be without vision in her right eye. Since I last saw her she's had no new neurologic symptoms. She denies numbness or weakness in either extremity she denies slurred speech she denies visual problems in her left eye. She continues to dip snuff.  Past Medical History  Diagnosis Date  . Hypertension   . Arthritis   . Carotid artery occlusion   . Thyroid disease   . Cancer     Breast  . Cancer     Colon  . Cancer     Skin  . Atrial fibrillation     Past Surgical History  Procedure Date  . Cataract extraction     bilateral  . Hernia repair     Left Ventral hernia  . Joint replacement 2005    Bilateral knee replacements  . Mastectomy     Right Breast  . Appendectomy   . Colon surgery     Colectomy X 2  . Abdominal hysterectomy     History   Social History  . Marital Status: Widowed    Spouse Name: N/A    Number of Children: N/A  . Years of Education: N/A   Occupational History  . Not on file.   Social History Main Topics  . Smoking status: Unknown If Ever Smoked  . Smokeless tobacco: Current User    Types: Snuff   Comment: Snuff user only   . Alcohol Use: No  . Drug Use: No  . Sexually Active:    Other Topics Concern  . Not on file   Social History Narrative  . No narrative on file    Family History  Problem Relation Age of Onset  . Heart murmur Sister   . Heart disease Mother   . Cancer Daughter    . Hyperlipidemia Daughter   . Cancer Son   . Diabetes Son   . Hyperlipidemia Son   . Hypertension Son   . Heart attack Son     Allergies as of 07/30/2011  . (No Known Allergies)    Current Outpatient Prescriptions on File Prior to Visit  Medication Sig Dispense Refill  . aspirin EC 325 MG tablet Take 325 mg by mouth daily.        . Calcium Carbonate-Vitamin D (CALCIUM + D PO) Take by mouth daily.        . digoxin (LANOXIN) 0.125 MG tablet Take 125 mcg by mouth daily.        . fish oil-omega-3 fatty acids 1000 MG capsule Take 2 g by mouth daily.        . hydrochlorothiazide (HYDRODIURIL) 25 MG tablet Take 25 mg by mouth daily.        Marland Kitchen levothyroxine (SYNTHROID, LEVOTHROID) 125 MCG tablet Take 125 mcg by mouth daily.        Marland Kitchen lisinopril (PRINIVIL,ZESTRIL) 5 MG tablet Take 5 mg by mouth daily.        Marland Kitchen  loratadine (CLARITIN) 10 MG tablet Take 10 mg by mouth daily.        . potassium chloride (KLOR-CON) 20 MEQ packet Take 20 mEq by mouth daily.           REVIEW OF SYSTEMS: Positive for bilateral occasional pain and hand numbness all other review of systems negative  PHYSICAL EXAMINATION:   Vital signs are BP 127/73  Pulse 96  Resp 16  Ht 5\' 1"  (1.549 m)  Wt 132 lb (59.875 kg)  BMI 24.94 kg/m2  SpO2 94% General: The patient appears their stated age. HEENT:  No gross abnormalities Pulmonary:  Non labored breathing Musculoskeletal: There are no major deformities. Neurologic: No focal weakness or paresthesias are detected, Skin: There are no ulcer or rashes noted. Psychiatric: The patient has normal affect. Cardiovascular: There is a regular rate and rhythm without significant murmur appreciated. No carotid bruits right carotid incision well healed   Diagnostic Studies Carotid ultrasound was ordered and reviewed today this shows a widely patent right carotid endarterectomy site without evidence of restenosis. The left-sided 60-79% disease  Assessment: Status post right  carotid endarterectomy Plan: The patient will be placed on carotid ultrasound protocol. Her neck skin will be in 6 months. I spent approximately 10 minutes discussing the need for tobacco cessation.  [name], M.D. [facility] [place] Office: [phone] Pager:  [phone]

## 2011-08-06 NOTE — Procedures (Unsigned)
CAROTID DUPLEX EXAM  INDICATION:  Carotid disease.  HISTORY: Diabetes:  No. Cardiac:  No. Hypertension:  Yes. Smoking:  Previous. Previous Surgery:  Right carotid endarterectomy, 12/08/2010. CV History:  Retinal artery occlusion of the right. Amaurosis Fugax No, Paresthesias No, Hemiparesis No.                                      RIGHT             LEFT Brachial systolic pressure:         143               140 Brachial Doppler waveforms:         Normal            Normal Vertebral direction of flow:        Antegrade         Antegrade DUPLEX VELOCITIES (cm/sec) CCA peak systolic                   68                67 ECA peak systolic                   202               151 ICA peak systolic                   101               332 ICA end diastolic                   23                65 PLAQUE MORPHOLOGY:                  Calcific          Mixed PLAQUE AMOUNT:                      Minimal           Moderate PLAQUE LOCATION:                    ECA               ICA, ECA, CCA  IMPRESSION: 1. Patent right carotid endarterectomy site without evidence of     restenosis of the internal carotid artery. 2. Left internal carotid artery velocities suggest 60% to 79%     stenosis. 3. Antegrade vertebral arteries bilaterally.  ___________________________________________ V. Charlena Cross, MD  EM/MEDQ  D:  07/30/2011  T:  07/30/2011  Job:  [id number]

## 2011-09-20 ENCOUNTER — Other Ambulatory Visit: Payer: Self-pay | Admitting: Dermatology

## 2011-09-22 DIAGNOSIS — H44519 Absolute glaucoma, unspecified eye: Secondary | ICD-10-CM | POA: Insufficient documentation

## 2011-09-24 DIAGNOSIS — H3530 Unspecified macular degeneration: Secondary | ICD-10-CM | POA: Insufficient documentation

## 2011-10-11 ENCOUNTER — Other Ambulatory Visit: Payer: Self-pay | Admitting: Dermatology

## 2012-01-28 ENCOUNTER — Other Ambulatory Visit: Payer: Self-pay | Admitting: *Deleted

## 2012-01-28 ENCOUNTER — Ambulatory Visit (INDEPENDENT_AMBULATORY_CARE_PROVIDER_SITE_OTHER): Payer: Medicare Other | Admitting: Surgery

## 2012-01-28 ENCOUNTER — Ambulatory Visit (INDEPENDENT_AMBULATORY_CARE_PROVIDER_SITE_OTHER): Payer: Medicare Other | Admitting: *Deleted

## 2012-01-28 ENCOUNTER — Encounter: Payer: Self-pay | Admitting: Surgery

## 2012-01-28 ENCOUNTER — Encounter: Payer: Self-pay | Admitting: *Deleted

## 2012-01-28 DIAGNOSIS — Z01818 Encounter for other preprocedural examination: Secondary | ICD-10-CM

## 2012-01-28 DIAGNOSIS — Z48812 Encounter for surgical aftercare following surgery on the circulatory system: Secondary | ICD-10-CM

## 2012-01-28 DIAGNOSIS — I6529 Occlusion and stenosis of unspecified carotid artery: Secondary | ICD-10-CM

## 2012-01-28 NOTE — Progress Notes (Signed)
[facility] [place]   Patient name: Brandy Huff MRN: [id number] DOB: 03/07/1918 Sex: female    No chief complaint on file.   HISTORY OF PRESENT ILLNESS: The patient was seen has a abdominal and following her vascular lab study. She has a history of undergoing right carotid endarterectomy in April of 2012. This was done following a retinal artery occlusion on the right. She was here for routine ultrasound and was found to have a mobile plaque within the carotid bifurcation. She remained asymptomatic.  Past Medical History  Diagnosis Date  . Hypertension   . Arthritis   . Carotid artery occlusion   . Thyroid disease   . Cancer     Breast  . Cancer     Colon  . Cancer     Skin  . Atrial fibrillation     Past Surgical History  Procedure Date  . Cataract extraction     bilateral  . Hernia repair     Left Ventral hernia  . Joint replacement 2005    Bilateral knee replacements  . Mastectomy     Right Breast  . Appendectomy   . Colon surgery     Colectomy X 2  . Abdominal hysterectomy     History   Social History  . Marital Status: Widowed    Spouse Name: N/A    Number of Children: N/A  . Years of Education: N/A   Occupational History  . Not on file.   Social History Main Topics  . Smoking status: Unknown If Ever Smoked  . Smokeless tobacco: Current User    Types: Snuff   Comment: Snuff user only   . Alcohol Use: No  . Drug Use: No  . Sexually Active:    Other Topics Concern  . Not on file   Social History Narrative  . No narrative on file    Family History  Problem Relation Age of Onset  . Heart murmur Sister   . Heart disease Mother   . Cancer Daughter   . Hyperlipidemia Daughter   . Cancer Son   . Diabetes Son   . Hyperlipidemia Son   . Hypertension Son   . Heart attack Son     Allergies as of 01/28/2012  . (No Known Allergies)    Current Outpatient Prescriptions on File Prior to Visit  Medication Sig Dispense  Refill  . aspirin EC 325 MG tablet Take 325 mg by mouth daily.        . Calcium Carbonate-Vitamin D (CALCIUM + D PO) Take by mouth daily.        . digoxin (LANOXIN) 0.125 MG tablet Take 125 mcg by mouth daily.        . dorzolamide-timolol (COSOPT) 22.3-6.8 MG/ML ophthalmic solution Place 1 drop into the right eye 2 (two) times daily.       . fish oil-omega-3 fatty acids 1000 MG capsule Take 2 g by mouth daily.        . hydrochlorothiazide (HYDRODIURIL) 25 MG tablet Take 25 mg by mouth daily.        . levothyroxine (SYNTHROID, LEVOTHROID) 125 MCG tablet Take 125 mcg by mouth daily.        . lisinopril (PRINIVIL,ZESTRIL) 5 MG tablet Take 5 mg by mouth daily.        . loratadine (CLARITIN) 10 MG tablet Take 10 mg by mouth daily.        . potassium chloride (KLOR-CON) 20 MEQ packet Take   20 mEq by mouth daily.        . scopolamine (HYOSCINE) 0.25 % ophthalmic solution Place 1 drop into the right eye once.          REVIEW OF SYSTEMS: No changes from prior visit  PHYSICAL EXAMINATION:   Vital signs are There were no vitals taken for this visit. General: The patient appears their stated age. HEENT:  No gross abnormalities Pulmonary:  Non labored breathing Musculoskeletal: There are no major deformities. Neurologic: No focal weakness or paresthesias are detected, Skin: There are no ulcer or rashes noted. Psychiatric: The patient has normal affect. Cardiovascular: There is a regular rate and rhythm without significant murmur appreciated. No carotid bruit   Diagnostic Studies I have ordered and reviewed for carotid duplex. She has a widely patent right carotid endarterectomy site. The left carotid measures in the 60-80% category. There is a mobile plaque at the carotid bifurcation.  Assessment: Mobile plaque in the left carotid artery Plan: I discussed with the patient and her daughter our options at this time. We could continue medical management or consider surgical intervention to remove  the mobile plaque. With her history of having had a stroke in the past which has left her with blindness in the right eye from a retinal artery occlusion the patient and family favor proceeding with carotid endarterectomy. They are well versed in the risks and benefits including the risk of nerve injury, the risk of stroke, and the risk of cardiopulmonary complications. Because she has had her right carotid artery done I will send her to ENT for vocal cord evaluation prior to her operation. Her surgery has been scheduled for Thursday, June 20.  V. Wells Corinna Burkman IV, M.D. [facility] [place] Office: [phone] Pager:  [phone]   

## 2012-01-29 ENCOUNTER — Encounter (HOSPITAL_COMMUNITY): Payer: Self-pay | Admitting: Pharmacy Technician

## 2012-02-01 ENCOUNTER — Encounter (HOSPITAL_COMMUNITY)
Admission: RE | Admit: 2012-02-01 | Discharge: 2012-02-01 | Disposition: A | Discharge status: Home / Self Care | Payer: Medicare Other | Source: Ambulatory Visit | Attending: Surgery | Admitting: Surgery

## 2012-02-01 ENCOUNTER — Encounter (HOSPITAL_COMMUNITY): Payer: Self-pay

## 2012-02-01 HISTORY — DX: Nausea with vomiting, unspecified: R11.2

## 2012-02-01 HISTORY — DX: Other specified postprocedural states: Z98.890

## 2012-02-01 LAB — COMPREHENSIVE METABOLIC PANEL
AST: 22 U/L (ref 0–37)
BUN: 15 mg/dL (ref 6–23)
CO2: 30 mEq/L (ref 19–32)
Calcium: 10.3 mg/dL (ref 8.4–10.5)
Chloride: 98 mEq/L (ref 96–112)
Creatinine, Ser: 0.74 mg/dL (ref 0.50–1.10)
GFR calc Af Amer: 83 mL/min — ABNORMAL LOW (ref >90)
GFR calc non Af Amer: 72 mL/min — ABNORMAL LOW (ref >90)
Glucose, Bld: 111 mg/dL — ABNORMAL HIGH (ref 70–99)
Total Bilirubin: 0.5 mg/dL (ref 0.3–1.2)

## 2012-02-01 LAB — URINE MICROSCOPIC-ADD ON

## 2012-02-01 LAB — CBC
HCT: 41.2 % (ref 36.0–46.0)
Hemoglobin: 14 g/dL (ref 12.0–15.0)
MCH: 31.3 pg (ref 26.0–34.0)
MCV: 92 fL (ref 78.0–100.0)
RBC: 4.48 MIL/uL (ref 3.87–5.11)
WBC: 9.4 10*3/uL (ref 4.0–10.5)

## 2012-02-01 LAB — URINALYSIS, ROUTINE W REFLEX MICROSCOPIC
Bilirubin Urine: NEGATIVE
Hgb urine dipstick: NEGATIVE
Ketones, ur: NEGATIVE mg/dL
Nitrite: POSITIVE — AB
Specific Gravity, Urine: 1.018 (ref 1.005–1.030)
Urobilinogen, UA: 0.2 mg/dL (ref 0.0–1.0)

## 2012-02-01 LAB — TYPE AND SCREEN: Antibody Screen: NEGATIVE

## 2012-02-01 LAB — APTT: aPTT: 31 seconds (ref 24–37)

## 2012-02-01 LAB — SURGICAL PCR SCREEN: Staphylococcus aureus: NEGATIVE

## 2012-02-01 NOTE — Progress Notes (Signed)
Patient reports that she had a stress test, EKG, CXR through Dr. Venita Sheffield office. Requested records

## 2012-02-01 NOTE — Pre-Procedure Instructions (Signed)
20 Brandy Huff  02/01/2012   Your procedure is scheduled on:  June 20  Report to [facility] at 08:30 AM.  Call this number if you have problems the morning of surgery: [phone]   Remember:   Do not eat food:After Midnight.  May have clear liquids:until Midnight .  Clear liquids include soda, tea, black coffee, apple or grape juice, broth.  Take these medicines the morning of surgery with A SIP OF WATER: Digoxin, Eye drops, Levothyroxine,   STOP Fish oil and Calcium today  Do not wear jewelry, make-up or nail polish.  Do not wear lotions, powders, or perfumes. You may wear deodorant.  Do not shave 48 hours prior to surgery. Men may shave face and neck.  Do not bring valuables to the hospital.  Contacts, dentures or bridgework may not be worn into surgery.  Leave suitcase in the car. After surgery it may be brought to your room.  For patients admitted to the hospital, checkout time is 11:00 AM the day of discharge.   Special Instructions: CHG Shower Use Special Wash: 1/2 bottle night before surgery and 1/2 bottle morning of surgery.   Please read over the following fact sheets that you were given: Pain Booklet, Coughing and Deep Breathing, Blood Transfusion Information, MRSA Information and Surgical Site Infection Prevention

## 2012-02-04 NOTE — Procedures (Unsigned)
CAROTID DUPLEX EXAM  INDICATION:  Carotid disease  HISTORY: Diabetes:  No Cardiac:  No Hypertension:  Yes Smoking:  Previous Previous Surgery:  Right CEA 12/08/2010 CV History:  Retinal artery occlusion of the right Amaurosis Fugax No, Paresthesias No, Hemiparesis No                                      RIGHT             LEFT Brachial systolic pressure:         180               175 Brachial Doppler waveforms:         Normal            Normal Vertebral direction of flow:        Antegrade         Antegrade DUPLEX VELOCITIES (cm/sec) CCA peak systolic                   68                63 ECA peak systolic                   117               142 ICA peak systolic                   99                292 ICA end diastolic                   32                57 PLAQUE MORPHOLOGY:                  Calcific          Heterogeneous PLAQUE AMOUNT:                      Minimal           Moderate PLAQUE LOCATION:                    ECA               ICA, ECA, CCA  IMPRESSION: 1. Patent right carotid endarterectomy site with no evidence of     restenosis of the internal carotid artery. 2. Left internal carotid artery velocity suggests 60%-79% stenosis     (low end of range). 3. Left external carotid artery stenosis with possible mobile plaque     noted in the external carotid artery/bifurcation segment. 4. Results were shown to Dr. [name].  ___________________________________________ V. Charlena Cross, MD  EM/MEDQ  D:  01/28/2012  T:  01/28/2012  Job:  [id number]

## 2012-02-05 ENCOUNTER — Other Ambulatory Visit: Payer: Self-pay | Admitting: *Deleted

## 2012-02-05 DIAGNOSIS — N390 Urinary tract infection, site not specified: Secondary | ICD-10-CM

## 2012-02-05 MED ORDER — CIPROFLOXACIN HCL 500 MG PO TABS: 500.0000 mg | ORAL_TABLET | Freq: Two times a day (BID) | ORAL | Status: AC

## 2012-02-07 MED ORDER — DEXTROSE 5 % IV SOLN
1.5000 g | INTRAVENOUS | Status: AC
  Administered 2012-02-08: 1.5 g via INTRAVENOUS
  Filled 2012-02-07 (×2): qty 1.5

## 2012-02-07 MED ORDER — SODIUM CHLORIDE 0.9 % IV SOLN: INTRAVENOUS | Status: DC

## 2012-02-08 ENCOUNTER — Inpatient Hospital Stay (HOSPITAL_COMMUNITY)
Admission: RE | Admit: 2012-02-08 | Discharge: 2012-02-09 | DRG: 039 | Disposition: A | Discharge status: Home / Self Care | Payer: Medicare Other | Source: Ambulatory Visit | Attending: Surgery | Admitting: Surgery

## 2012-02-08 ENCOUNTER — Encounter (HOSPITAL_COMMUNITY): Payer: Self-pay | Admitting: Surgery

## 2012-02-08 ENCOUNTER — Encounter (HOSPITAL_COMMUNITY)
Admission: RE | Disposition: A | Discharge status: Home / Self Care | Payer: Self-pay | Source: Ambulatory Visit | Attending: Surgery

## 2012-02-08 ENCOUNTER — Ambulatory Visit (HOSPITAL_COMMUNITY): Payer: Medicare Other | Admitting: Anesthesiology

## 2012-02-08 ENCOUNTER — Encounter (HOSPITAL_COMMUNITY): Payer: Self-pay | Admitting: Anesthesiology

## 2012-02-08 DIAGNOSIS — Z85828 Personal history of other malignant neoplasm of skin: Secondary | ICD-10-CM

## 2012-02-08 DIAGNOSIS — I6529 Occlusion and stenosis of unspecified carotid artery: Principal | ICD-10-CM | POA: Diagnosis present

## 2012-02-08 DIAGNOSIS — I10 Essential (primary) hypertension: Secondary | ICD-10-CM | POA: Diagnosis present

## 2012-02-08 DIAGNOSIS — Z85038 Personal history of other malignant neoplasm of large intestine: Secondary | ICD-10-CM

## 2012-02-08 DIAGNOSIS — Z853 Personal history of malignant neoplasm of breast: Secondary | ICD-10-CM

## 2012-02-08 DIAGNOSIS — I4891 Unspecified atrial fibrillation: Secondary | ICD-10-CM | POA: Diagnosis present

## 2012-02-08 DIAGNOSIS — N390 Urinary tract infection, site not specified: Secondary | ICD-10-CM

## 2012-02-08 HISTORY — PX: ENDARTERECTOMY: SHX5162

## 2012-02-08 LAB — URINALYSIS, ROUTINE W REFLEX MICROSCOPIC
Bilirubin Urine: NEGATIVE
Ketones, ur: NEGATIVE mg/dL
Leukocytes, UA: NEGATIVE
Nitrite: NEGATIVE
Specific Gravity, Urine: 1.023 (ref 1.005–1.030)
Urobilinogen, UA: 0.2 mg/dL (ref 0.0–1.0)
pH: 5 (ref 5.0–8.0)

## 2012-02-08 SURGERY — ENDARTERECTOMY, CAROTID
Anesthesia: General | Site: Neck | Laterality: Left

## 2012-02-08 MED ORDER — LIDOCAINE HCL (CARDIAC) 20 MG/ML IV SOLN
INTRAVENOUS | Status: DC | PRN
  Administered 2012-02-08: 60 mg via INTRAVENOUS

## 2012-02-08 MED ORDER — LISINOPRIL 5 MG PO TABS
5.0000 mg | ORAL_TABLET | Freq: Every day | ORAL | Status: DC
  Administered 2012-02-09: 5 mg via ORAL
  Filled 2012-02-08 (×2): qty 1

## 2012-02-08 MED ORDER — ACETAMINOPHEN 325 MG PO TABS: 325.0000 mg | ORAL_TABLET | ORAL | Status: DC | PRN

## 2012-02-08 MED ORDER — PHENYLEPHRINE HCL 10 MG/ML IJ SOLN
10.0000 mg | INTRAMUSCULAR | Status: DC | PRN
  Administered 2012-02-08: 10 ug/min via INTRAVENOUS

## 2012-02-08 MED ORDER — SODIUM CHLORIDE 0.9 % IV SOLN
INTRAVENOUS | Status: DC
  Administered 2012-02-08: 16:00:00 via INTRAVENOUS

## 2012-02-08 MED ORDER — DOCUSATE SODIUM 100 MG PO CAPS
100.0000 mg | ORAL_CAPSULE | Freq: Every day | ORAL | Status: DC
  Administered 2012-02-09: 100 mg via ORAL
  Filled 2012-02-08: qty 1

## 2012-02-08 MED ORDER — LABETALOL HCL 5 MG/ML IV SOLN
INTRAVENOUS | Status: DC | PRN
  Administered 2012-02-08: 2.5 mg via INTRAVENOUS

## 2012-02-08 MED ORDER — MORPHINE SULFATE 2 MG/ML IJ SOLN: 2.0000 mg | INTRAMUSCULAR | Status: DC | PRN

## 2012-02-08 MED ORDER — ONDANSETRON HCL 4 MG/2ML IJ SOLN: 4.0000 mg | Freq: Four times a day (QID) | INTRAMUSCULAR | Status: DC | PRN

## 2012-02-08 MED ORDER — HEMOSTATIC AGENTS (NO CHARGE) OPTIME
TOPICAL | Status: DC | PRN
  Administered 2012-02-08: 1 via TOPICAL

## 2012-02-08 MED ORDER — SODIUM CHLORIDE 0.9 % IV SOLN: 500.0000 mL | Freq: Once | INTRAVENOUS | Status: AC | PRN

## 2012-02-08 MED ORDER — OXYCODONE HCL 5 MG PO TABS
5.0000 mg | ORAL_TABLET | ORAL | Status: DC | PRN
  Administered 2012-02-08: 5 mg via ORAL
  Filled 2012-02-08: qty 1

## 2012-02-08 MED ORDER — LEVOTHYROXINE SODIUM 125 MCG PO TABS
125.0000 ug | ORAL_TABLET | Freq: Every day | ORAL | Status: DC
  Administered 2012-02-09: 125 ug via ORAL
  Filled 2012-02-08 (×3): qty 1

## 2012-02-08 MED ORDER — PROTAMINE SULFATE 10 MG/ML IV SOLN
INTRAVENOUS | Status: DC | PRN
  Administered 2012-02-08: 50 mg via INTRAVENOUS

## 2012-02-08 MED ORDER — DORZOLAMIDE HCL-TIMOLOL MAL 2-0.5 % OP SOLN
1.0000 [drp] | Freq: Two times a day (BID) | OPHTHALMIC | Status: DC
  Administered 2012-02-08 – 2012-02-09 (×2): 1 [drp] via OPHTHALMIC
  Filled 2012-02-08: qty 10

## 2012-02-08 MED ORDER — DIGOXIN 125 MCG PO TABS
125.0000 ug | ORAL_TABLET | Freq: Every day | ORAL | Status: DC
  Administered 2012-02-09: 125 ug via ORAL
  Filled 2012-02-08: qty 1

## 2012-02-08 MED ORDER — DEXTROSE 5 % IV SOLN
1.5000 g | Freq: Two times a day (BID) | INTRAVENOUS | Status: DC
  Administered 2012-02-08: 1.5 g via INTRAVENOUS
  Filled 2012-02-08 (×2): qty 1.5

## 2012-02-08 MED ORDER — DOPAMINE-DEXTROSE 3.2-5 MG/ML-% IV SOLN: 3.0000 ug/kg/min | INTRAVENOUS | Status: DC

## 2012-02-08 MED ORDER — ALUM & MAG HYDROXIDE-SIMETH 200-200-20 MG/5ML PO SUSP: 15.0000 mL | ORAL | Status: DC | PRN

## 2012-02-08 MED ORDER — HEPARIN SODIUM (PORCINE) 1000 UNIT/ML IJ SOLN
INTRAMUSCULAR | Status: DC | PRN
  Administered 2012-02-08: 6000 [IU] via INTRAVENOUS

## 2012-02-08 MED ORDER — 0.9 % SODIUM CHLORIDE (POUR BTL) OPTIME
TOPICAL | Status: DC | PRN
  Administered 2012-02-08 (×2): 1000 mL

## 2012-02-08 MED ORDER — LACTATED RINGERS IV SOLN
INTRAVENOUS | Status: DC
  Administered 2012-02-08: 10:00:00 via INTRAVENOUS

## 2012-02-08 MED ORDER — GLYCOPYRROLATE 0.2 MG/ML IJ SOLN
INTRAMUSCULAR | Status: DC | PRN
  Administered 2012-02-08: 0.2 mg via INTRAVENOUS
  Administered 2012-02-08: .4 mg via INTRAVENOUS
  Administered 2012-02-08: 0.2 mg via INTRAVENOUS

## 2012-02-08 MED ORDER — POTASSIUM CHLORIDE CRYS ER 20 MEQ PO TBCR
20.0000 meq | EXTENDED_RELEASE_TABLET | Freq: Once | ORAL | Status: AC | PRN
  Filled 2012-02-08: qty 1

## 2012-02-08 MED ORDER — PROPOFOL 10 MG/ML IV EMUL
INTRAVENOUS | Status: DC | PRN
  Administered 2012-02-08: 200 mg via INTRAVENOUS

## 2012-02-08 MED ORDER — ASPIRIN EC 325 MG PO TBEC: 325.0000 mg | DELAYED_RELEASE_TABLET | Freq: Every day | ORAL | Status: DC

## 2012-02-08 MED ORDER — PANTOPRAZOLE SODIUM 40 MG PO TBEC
40.0000 mg | DELAYED_RELEASE_TABLET | Freq: Every day | ORAL | Status: DC
  Administered 2012-02-08: 40 mg via ORAL
  Filled 2012-02-08: qty 1

## 2012-02-08 MED ORDER — ACETAMINOPHEN 650 MG RE SUPP: 325.0000 mg | RECTAL | Status: DC | PRN

## 2012-02-08 MED ORDER — ROCURONIUM BROMIDE 100 MG/10ML IV SOLN
INTRAVENOUS | Status: DC | PRN
  Administered 2012-02-08: 50 mg via INTRAVENOUS

## 2012-02-08 MED ORDER — SCOPOLAMINE HBR 0.25 % OP SOLN: 1.0000 [drp] | Freq: Every day | OPHTHALMIC | Status: DC

## 2012-02-08 MED ORDER — METOPROLOL TARTRATE 1 MG/ML IV SOLN: 2.0000 mg | INTRAVENOUS | Status: DC | PRN

## 2012-02-08 MED ORDER — ONDANSETRON HCL 4 MG/2ML IJ SOLN
INTRAMUSCULAR | Status: DC | PRN
  Administered 2012-02-08: 4 mg via INTRAVENOUS

## 2012-02-08 MED ORDER — LORATADINE 10 MG PO TABS
10.0000 mg | ORAL_TABLET | Freq: Every day | ORAL | Status: DC | PRN
  Filled 2012-02-08: qty 1

## 2012-02-08 MED ORDER — LACTATED RINGERS IV SOLN
INTRAVENOUS | Status: DC | PRN
  Administered 2012-02-08 (×2): via INTRAVENOUS

## 2012-02-08 MED ORDER — POTASSIUM CHLORIDE 20 MEQ PO PACK
20.0000 meq | PACK | Freq: Every day | ORAL | Status: DC
  Filled 2012-02-08 (×2): qty 1

## 2012-02-08 MED ORDER — ASPIRIN EC 325 MG PO TBEC
325.0000 mg | DELAYED_RELEASE_TABLET | Freq: Every day | ORAL | Status: DC
  Administered 2012-02-08 – 2012-02-09 (×2): 325 mg via ORAL
  Filled 2012-02-08 (×2): qty 1

## 2012-02-08 MED ORDER — HYDROCHLOROTHIAZIDE 25 MG PO TABS
25.0000 mg | ORAL_TABLET | Freq: Every day | ORAL | Status: DC
  Administered 2012-02-09: 25 mg via ORAL
  Filled 2012-02-08: qty 1

## 2012-02-08 MED ORDER — PHENOL 1.4 % MT LIQD: 1.0000 | OROMUCOSAL | Status: DC | PRN

## 2012-02-08 MED ORDER — MAGNESIUM SULFATE 40 MG/ML IJ SOLN
2.0000 g | Freq: Once | INTRAMUSCULAR | Status: AC | PRN
  Filled 2012-02-08: qty 50

## 2012-02-08 MED ORDER — LIDOCAINE HCL 4 % MT SOLN
OROMUCOSAL | Status: DC | PRN
  Administered 2012-02-08: 4 mL via TOPICAL

## 2012-02-08 MED ORDER — ATROPINE SULFATE 1 % OP SOLN
1.0000 [drp] | Freq: Every day | OPHTHALMIC | Status: DC
  Administered 2012-02-09: 1 [drp] via OPHTHALMIC
  Filled 2012-02-08: qty 2

## 2012-02-08 MED ORDER — HYDRALAZINE HCL 20 MG/ML IJ SOLN: 10.0000 mg | INTRAMUSCULAR | Status: DC | PRN

## 2012-02-08 MED ORDER — LABETALOL HCL 5 MG/ML IV SOLN: 10.0000 mg | INTRAVENOUS | Status: DC | PRN

## 2012-02-08 MED ORDER — SODIUM CHLORIDE 0.9 % IR SOLN
Status: DC | PRN
  Administered 2012-02-08: 12:00:00

## 2012-02-08 MED ORDER — HYDROMORPHONE HCL PF 1 MG/ML IJ SOLN: 0.2500 mg | INTRAMUSCULAR | Status: DC | PRN

## 2012-02-08 MED ORDER — SUFENTANIL CITRATE 50 MCG/ML IV SOLN
INTRAVENOUS | Status: DC | PRN
  Administered 2012-02-08: 20 ug via INTRAVENOUS

## 2012-02-08 MED ORDER — NEOSTIGMINE METHYLSULFATE 1 MG/ML IJ SOLN
INTRAMUSCULAR | Status: DC | PRN
  Administered 2012-02-08: 3 mg via INTRAVENOUS

## 2012-02-08 MED ORDER — GUAIFENESIN-DM 100-10 MG/5ML PO SYRP: 15.0000 mL | ORAL_SOLUTION | ORAL | Status: DC | PRN

## 2012-02-08 SURGICAL SUPPLY — 48 items
ADH SKN CLS APL DERMABOND .7 (GAUZE/BANDAGES/DRESSINGS) ×1
CANISTER SUCTION 2500CC (MISCELLANEOUS) ×2 IMPLANT
CATH ROBINSON RED A/P 18FR (CATHETERS) ×2 IMPLANT
CATH SUCT 10FR WHISTLE TIP (CATHETERS) ×2 IMPLANT
CLIP TI MEDIUM 24 (CLIP) ×2 IMPLANT
CLIP TI WIDE RED SMALL 24 (CLIP) ×2 IMPLANT
CLOTH BEACON ORANGE TIMEOUT ST (SAFETY) ×2 IMPLANT
COVER SURGICAL LIGHT HANDLE (MISCELLANEOUS) ×4 IMPLANT
CRADLE DONUT ADULT HEAD (MISCELLANEOUS) ×2 IMPLANT
DERMABOND ADVANCED (GAUZE/BANDAGES/DRESSINGS) ×1
DERMABOND ADVANCED .7 DNX12 (GAUZE/BANDAGES/DRESSINGS) ×1 IMPLANT
DRAIN CHANNEL 15F RND FF W/TCR (WOUND CARE) IMPLANT
DRAPE WARM FLUID 44X44 (DRAPE) ×2 IMPLANT
ELECT REM PT RETURN 9FT ADLT (ELECTROSURGICAL) ×2
ELECTRODE REM PT RTRN 9FT ADLT (ELECTROSURGICAL) ×1 IMPLANT
EVACUATOR SILICONE 100CC (DRAIN) IMPLANT
GLOVE BIOGEL PI IND STRL 6.5 (GLOVE) IMPLANT
GLOVE BIOGEL PI IND STRL 7.5 (GLOVE) ×1 IMPLANT
GLOVE BIOGEL PI INDICATOR 6.5 (GLOVE) ×2
GLOVE BIOGEL PI INDICATOR 7.5 (GLOVE) ×2
GLOVE ECLIPSE 6.5 STRL STRAW (GLOVE) ×1 IMPLANT
GLOVE SS BIOGEL STRL SZ 7 (GLOVE) IMPLANT
GLOVE SUPERSENSE BIOGEL SZ 7 (GLOVE) ×2
GLOVE SURG SS PI 7.5 STRL IVOR (GLOVE) ×4 IMPLANT
GOWN PREVENTION PLUS XXLARGE (GOWN DISPOSABLE) ×2 IMPLANT
GOWN STRL NON-REIN LRG LVL3 (GOWN DISPOSABLE) ×6 IMPLANT
HEMOSTAT SNOW SURGICEL 2X4 (HEMOSTASIS) IMPLANT
HEMOSTAT SURGICEL 2X14 (HEMOSTASIS) IMPLANT
INSERT FOGARTY SM (MISCELLANEOUS) IMPLANT
KIT BASIN OR (CUSTOM PROCEDURE TRAY) ×2 IMPLANT
KIT ROOM TURNOVER OR (KITS) ×2 IMPLANT
NS IRRIG 1000ML POUR BTL (IV SOLUTION) ×4 IMPLANT
PACK CAROTID (CUSTOM PROCEDURE TRAY) ×2 IMPLANT
PAD ARMBOARD 7.5X6 YLW CONV (MISCELLANEOUS) ×4 IMPLANT
PATCH VASCULAR VASCU GUARD 1X6 (Vascular Products) ×1 IMPLANT
SHUNT CAROTID BYPASS 10 (VASCULAR PRODUCTS) IMPLANT
SHUNT CAROTID BYPASS 12FRX15.5 (VASCULAR PRODUCTS) IMPLANT
SPECIMEN JAR SMALL (MISCELLANEOUS) ×2 IMPLANT
SPONGE INTESTINAL PEANUT (DISPOSABLE) ×2 IMPLANT
SUT ETHILON 3 0 PS 1 (SUTURE) IMPLANT
SUT PROLENE 6 0 BV (SUTURE) ×2 IMPLANT
SUT PROLENE 7 0 BV 1 (SUTURE) IMPLANT
SUT VIC AB 3-0 SH 27 (SUTURE) ×4
SUT VIC AB 3-0 SH 27X BRD (SUTURE) ×2 IMPLANT
SUT VICRYL 4-0 PS2 18IN ABS (SUTURE) ×2 IMPLANT
TOWEL OR 17X24 6PK STRL BLUE (TOWEL DISPOSABLE) ×2 IMPLANT
TOWEL OR 17X26 10 PK STRL BLUE (TOWEL DISPOSABLE) ×2 IMPLANT
WATER STERILE IRR 1000ML POUR (IV SOLUTION) ×2 IMPLANT

## 2012-02-08 NOTE — Anesthesia Preprocedure Evaluation (Signed)
Anesthesia Evaluation  Patient identified by MRN, date of birth, ID band Patient awake    Reviewed: Allergy & Precautions, H&P , NPO status , Patient's Chart, lab work & pertinent test results  History of Anesthesia Complications (+) PONV  Airway Mallampati: II  Neck ROM: full    Dental   Pulmonary          Cardiovascular hypertension, + dysrhythmias Atrial Fibrillation     Neuro/Psych    GI/Hepatic   Endo/Other    Renal/GU      Musculoskeletal  (+) Arthritis -,   Abdominal   Peds  Hematology   Anesthesia Other Findings   Reproductive/Obstetrics                           Anesthesia Physical Anesthesia Plan  ASA: III  Anesthesia Plan: General   Post-op Pain Management:    Induction: Intravenous  Airway Management Planned: Oral ETT  Additional Equipment: Arterial line  Intra-op Plan:   Post-operative Plan: Extubation in OR  Informed Consent: I have reviewed the patients History and Physical, chart, labs and discussed the procedure including the risks, benefits and alternatives for the proposed anesthesia with the patient or authorized representative who has indicated his/her understanding and acceptance.     Plan Discussed with: CRNA and Surgeon  Anesthesia Plan Comments:         Anesthesia Quick Evaluation

## 2012-02-08 NOTE — Interval H&P Note (Signed)
History and Physical Interval Note:  02/08/2012 11:49 AM  Brandy Huff  has presented today for surgery, with the diagnosis of ICA STENOSIS  The various methods of treatment have been discussed with the patient and family. After consideration of risks, benefits and other options for treatment, the patient has consented to  Procedure(s) (LRB): ENDARTERECTOMY CAROTID (Left) as a surgical intervention .  The patient's history has been reviewed, patient examined, no change in status, stable for surgery.  I have reviewed the patients' chart and labs.  Questions were answered to the patient's satisfaction.     Selene Peltzer IV, V. WELLS

## 2012-02-08 NOTE — Op Note (Signed)
[facility] [place]  Patient name: Brandy Huff MRN: [id number] DOB: March 30, 1918 Sex: female  02/08/2012 Pre-operative Diagnosis: Asymptomatic   left carotid stenosis Post-operative diagnosis:  Same Surgeon:  [name] Assistants:  Narda Amber Procedure:    left carotid Endarterectomy with bovine pericardial patch angioplasty Anesthesia:  General Blood Loss:  See anesthesia record Specimens:  Carotid Plaque to pathology  Findings:  75 %stenosis; Thrombus:  none  Indications:  She has a history of undergoing right carotid endarterectomy in April of 2012. This was done following a retinal artery occlusion on the right. She was here for routine ultrasound and was found to have a mobile plaque within the carotid bifurcation. She remained asymptomatic.  With her history, she wished to have this repaired to reduce the risk of stroke.  ENT evaluated her vocal cords and they were symmetric.     Procedure:  The patient was identified in the holding area and taken to [facility] OR ROOM 16  The patient was then placed supine on the table.   General endotrachial anesthesia was administered.  The patient was prepped and draped in the usual sterile fashion.  A time out was called and antibiotics were administered.  The incision was made along the anterior border of the left sternocleidomastoid muscle.  Cautery was used to dissect through the subcutaneous tissue.  The platysma muscle was divided with cautery.  The internal jugular vein was exposed along its anterior medial border.  The common facial vein was exposed and then divided between 2-0 silk ties and metal clips.  The common carotid artery was then circumferentially exposed and encircled with an umbilical tape.  The vagus nerve was identified and protected.  Next sharp dissection was used to expose the external carotid artery and the superior thyroid artery.  The were encircled with a blue vessel loop and a 2-0 silk tie  respectively.  Finally, the internal carotid was carefully dissected free.  An umbilical tape was placed around the internal carotid artery distal to the diseased segment.  I did not visualize the hypoglossal nerve as we were below it .   The patient was given systemic heparinization.  A bovine carotid patch was selected and prepared on the back table.  A 10 french shunt was also prepared.  After blood pressure readings were appropriate and the heparin had been given time to circulate, the internal carotid artery was occluded with a baby Gregory clamp.  The external and common carotid arteries were then occluded with vascular clamps and the 2-0 tie tightened on the superior thyroid artery.  A #11 blade was used to make an arteriotomy in the common carotid artery.  This was extended with Potts scissors along the anterior and lateral border of the common and internal carotid artery.  Approximately 75% stenosis was identified.  There was no thrombus identified.  I elected not to place a shunt due to the small size of her artery and good back-bleeding.  A kleiner kuntz elevator was used to perform endarterectomy.  An eversion endarterectomy was performed in the external carotid artery.  A good distal endpoint was obtained in the internal carotid artery.  The specimen was removed and sent to pathology.  Heparinized saline was used to irrigate the endarterectomized field.  All potential embolic debris was removed.  Bovine pericardial patch angioplasty was then performed using a running 6-0 Prolene.The common internal and external carotid arteries were all appropriately flushed. The artery was again irrigated with  heparin saline.  The anastomosis was then secured. The clamp was first released on the external carotid artery followed by the common carotid artery approximately 30 seconds later, bloodflow was reestablish through the internal carotid artery.  Next, a hand-held  Doppler was used to evaluate the signals in the  common, external, and internal  carotid arteries, all of which had appropriate signals. I then administered  50 mg protamine. The wound was then irrigated.  After hemostasis was achieved, the carotid sheath was reapproximated with 3-0 Vicryl. The  platysma muscle was reapproximated with running 3-0 Vicryl. The skin  was closed with 4-0 Vicryl. Dermabond was placed on the skin. The  patient was then successfully extubated. His neurologic exam was  similar to his preprocedural exam. He was then taken to recovery room  in stable condition. There were no complications.     Disposition:  To PACU in stable condition.  Relevant Operative Details:  Very small artery.  Low bifurcation.  No shunt  V. Durene Cal, M.D. [facility] [place] Office: [phone] Pager:  [phone]

## 2012-02-08 NOTE — Preoperative (Signed)
Beta Blockers   Reason not to administer Beta Blockers:Not Applicable 

## 2012-02-08 NOTE — Anesthesia Postprocedure Evaluation (Signed)
Anesthesia Post Note  Patient: Brandy Huff  Procedure(s) Performed: Procedure(s) (LRB): ENDARTERECTOMY CAROTID (Left)  Anesthesia type: General  Patient location: PACU  Post pain: Pain level controlled and Adequate analgesia  Post assessment: Post-op Vital signs reviewed, Patient's Cardiovascular Status Stable, Respiratory Function Stable, Patent Airway and Pain level controlled  Last Vitals:  Filed Vitals:   02/08/12 1430  BP: 168/69  Pulse: 83  Temp:   Resp: 13    Post vital signs: Reviewed and stable  Level of consciousness: awake, alert  and oriented  Complications: No apparent anesthesia complications

## 2012-02-08 NOTE — Transfer of Care (Signed)
Immediate Anesthesia Transfer of Care Note  Patient: Brandy Huff  Procedure(s) Performed: Procedure(s) (LRB): ENDARTERECTOMY CAROTID (Left)  Patient Location: PACU  Anesthesia Type: General  Level of Consciousness: awake, alert  and patient cooperative  Airway & Oxygen Therapy: Patient Spontanous Breathing and Patient connected to face mask oxygen  Post-op Assessment: Report given to PACU RN  Post vital signs: Reviewed and stable  Complications: No apparent anesthesia complications

## 2012-02-08 NOTE — Anesthesia Procedure Notes (Signed)
Procedure Name: Intubation Date/Time: 02/08/2012 12:15 PM Performed by: Glendora Score A Pre-anesthesia Checklist: Patient identified, Emergency Drugs available, Suction available and Patient being monitored Patient Re-evaluated:Patient Re-evaluated prior to inductionOxygen Delivery Method: Circle system utilized Preoxygenation: Pre-oxygenation with 100% oxygen Intubation Type: IV induction Ventilation: Mask ventilation without difficulty and Oral airway inserted - appropriate to patient size Laryngoscope Size: Hyacinth Meeker and 2 Grade View: Grade I Tube type: Oral Tube size: 7.0 mm Number of attempts: 1 Airway Equipment and Method: Stylet and LTA kit utilized Placement Confirmation: ETT inserted through vocal cords under direct vision,  positive ETCO2 and breath sounds checked- equal and bilateral Secured at: 21 cm Tube secured with: Tape Dental Injury: Teeth and Oropharynx as per pre-operative assessment

## 2012-02-08 NOTE — H&P (View-Only) (Signed)
[facility] [place]   Patient name: Brandy Huff MRN: [id number] DOB: Nov 01, 1917 Sex: female    No chief complaint on file.   HISTORY OF PRESENT ILLNESS: The patient was seen has a abdominal and following her vascular lab study. She has a history of undergoing right carotid endarterectomy in April of 2012. This was done following a retinal artery occlusion on the right. She was here for routine ultrasound and was found to have a mobile plaque within the carotid bifurcation. She remained asymptomatic.  Past Medical History  Diagnosis Date  . Hypertension   . Arthritis   . Carotid artery occlusion   . Thyroid disease   . Cancer     Breast  . Cancer     Colon  . Cancer     Skin  . Atrial fibrillation     Past Surgical History  Procedure Date  . Cataract extraction     bilateral  . Hernia repair     Left Ventral hernia  . Joint replacement 2005    Bilateral knee replacements  . Mastectomy     Right Breast  . Appendectomy   . Colon surgery     Colectomy X 2  . Abdominal hysterectomy     History   Social History  . Marital Status: Widowed    Spouse Name: N/A    Number of Children: N/A  . Years of Education: N/A   Occupational History  . Not on file.   Social History Main Topics  . Smoking status: Unknown If Ever Smoked  . Smokeless tobacco: Current User    Types: Snuff   Comment: Snuff user only   . Alcohol Use: No  . Drug Use: No  . Sexually Active:    Other Topics Concern  . Not on file   Social History Narrative  . No narrative on file    Family History  Problem Relation Age of Onset  . Heart murmur Sister   . Heart disease Mother   . Cancer Daughter   . Hyperlipidemia Daughter   . Cancer Son   . Diabetes Son   . Hyperlipidemia Son   . Hypertension Son   . Heart attack Son     Allergies as of 01/28/2012  . (No Known Allergies)    Current Outpatient Prescriptions on File Prior to Visit  Medication Sig Dispense  Refill  . aspirin EC 325 MG tablet Take 325 mg by mouth daily.        . Calcium Carbonate-Vitamin D (CALCIUM + D PO) Take by mouth daily.        . digoxin (LANOXIN) 0.125 MG tablet Take 125 mcg by mouth daily.        . dorzolamide-timolol (COSOPT) 22.3-6.8 MG/ML ophthalmic solution Place 1 drop into the right eye 2 (two) times daily.       . fish oil-omega-3 fatty acids 1000 MG capsule Take 2 g by mouth daily.        . hydrochlorothiazide (HYDRODIURIL) 25 MG tablet Take 25 mg by mouth daily.        Marland Kitchen levothyroxine (SYNTHROID, LEVOTHROID) 125 MCG tablet Take 125 mcg by mouth daily.        Marland Kitchen lisinopril (PRINIVIL,ZESTRIL) 5 MG tablet Take 5 mg by mouth daily.        Marland Kitchen loratadine (CLARITIN) 10 MG tablet Take 10 mg by mouth daily.        . potassium chloride (KLOR-CON) 20 MEQ packet Take  20 mEq by mouth daily.        Marland Kitchen scopolamine (HYOSCINE) 0.25 % ophthalmic solution Place 1 drop into the right eye once.          REVIEW OF SYSTEMS: No changes from prior visit  PHYSICAL EXAMINATION:   Vital signs are There were no vitals taken for this visit. General: The patient appears their stated age. HEENT:  No gross abnormalities Pulmonary:  Non labored breathing Musculoskeletal: There are no major deformities. Neurologic: No focal weakness or paresthesias are detected, Skin: There are no ulcer or rashes noted. Psychiatric: The patient has normal affect. Cardiovascular: There is a regular rate and rhythm without significant murmur appreciated. No carotid bruit   Diagnostic Studies I have ordered and reviewed for carotid duplex. She has a widely patent right carotid endarterectomy site. The left carotid measures in the 60-80% category. There is a mobile plaque at the carotid bifurcation.  Assessment: Mobile plaque in the left carotid artery Plan: I discussed with the patient and her daughter our options at this time. We could continue medical management or consider surgical intervention to remove  the mobile plaque. With her history of having had a stroke in the past which has left her with blindness in the right eye from a retinal artery occlusion the patient and family favor proceeding with carotid endarterectomy. They are well versed in the risks and benefits including the risk of nerve injury, the risk of stroke, and the risk of cardiopulmonary complications. Because she has had her right carotid artery done I will send her to ENT for vocal cord evaluation prior to her operation. Her surgery has been scheduled for Thursday, June 20.  [name], M.D. [facility] [place] Office: [phone] Pager:  [phone]

## 2012-02-08 NOTE — OR Nursing (Signed)
Pre-operative patient had good strong hand grips, wiggle toes on command, stick out her tongue and it was centered.

## 2012-02-08 NOTE — Progress Notes (Signed)
Utilization review completed.  

## 2012-02-08 NOTE — Plan of Care (Signed)
Problem: Phase I Progression Outcomes Goal: Vascular site scale level 0 - I Vascular Site Scale Level 0: No bruising/bleeding/hematoma Level I (Mild): Bruising/Ecchymosis, minimal bleeding/ooozing, palpable hematoma < 3 cm Level II (Moderate): Bleeding not affecting hemodynamic parameters, pseudoaneurysm, palpable hematoma > 3 cm  Outcome: Completed/Met Date Met:  02/08/12 Level 0 left neck

## 2012-02-09 LAB — BASIC METABOLIC PANEL
BUN: 11 mg/dL (ref 6–23)
CO2: 27 mEq/L (ref 19–32)
Calcium: 8.3 mg/dL — ABNORMAL LOW (ref 8.4–10.5)
Creatinine, Ser: 0.6 mg/dL (ref 0.50–1.10)
GFR calc non Af Amer: 77 mL/min — ABNORMAL LOW (ref >90)
Glucose, Bld: 103 mg/dL — ABNORMAL HIGH (ref 70–99)

## 2012-02-09 LAB — CBC
HCT: 33.4 % — ABNORMAL LOW (ref 36.0–46.0)
Hemoglobin: 11.2 g/dL — ABNORMAL LOW (ref 12.0–15.0)
MCH: 30.5 pg (ref 26.0–34.0)
MCHC: 33.5 g/dL (ref 30.0–36.0)
MCV: 91 fL (ref 78.0–100.0)
RBC: 3.67 MIL/uL — ABNORMAL LOW (ref 3.87–5.11)

## 2012-02-09 MED ORDER — POTASSIUM CHLORIDE CRYS ER 20 MEQ PO TBCR
20.0000 meq | EXTENDED_RELEASE_TABLET | Freq: Every day | ORAL | Status: DC
  Administered 2012-02-09: 20 meq via ORAL
  Filled 2012-02-09: qty 1

## 2012-02-09 NOTE — Discharge Summary (Signed)
[facility] Discharge Summary   Patient ID:  Brandy Huff MRN: [id number] DOB/AGE: 1918-07-16 78 y.o.  Admit date: 02/08/2012 Discharge date: 02/09/2012 Date of Surgery: 02/08/2012 Surgeon: Surgeon(s): Nada Libman, MD  Admission Diagnosis: ICA STENOSIS  Discharge Diagnoses:  ICA STENOSIS  Secondary Diagnoses: Past Medical History  Diagnosis Date  . Hypertension   . Arthritis   . Carotid artery occlusion   . Thyroid disease   . Cancer     Breast  . Cancer     Colon  . Cancer     Skin  . Atrial fibrillation   . PONV (postoperative nausea and vomiting)     Procedure(s): ENDARTERECTOMY CAROTID  Discharged Condition: good  HPI:  The patient was seen has a abdominal and following her vascular lab study. She has a history of undergoing right carotid endarterectomy in April of 2012. This was done following a retinal artery occlusion on the right. She was here for routine ultrasound and was found to have a mobile plaque within the carotid bifurcation. She remained asymptomatic.   Hospital Course:  Brandy Huff is a 78 y.o. female is S/P Left Procedure(s): ENDARTERECTOMY CAROTID Extubated: POD # 0 Post-op wounds clean, dry, intact or healing well Pt. Ambulating, voiding and taking PO diet without difficulty. Pt pain controlled with PO pain meds. Labs as below Complications:none  Consults:     Significant Diagnostic Studies: CBC Lab Results  Component Value Date   WBC 6.3 02/09/2012   HGB 11.2* 02/09/2012   HCT 33.4* 02/09/2012   MCV 91.0 02/09/2012   PLT 161 02/09/2012    BMET    Component Value Date/Time   NA 136 02/09/2012 0415   K 3.1* 02/09/2012 0415   CL 99 02/09/2012 0415   CO2 27 02/09/2012 0415   GLUCOSE 103* 02/09/2012 0415   BUN 11 02/09/2012 0415   CREATININE 0.60 02/09/2012 0415   CALCIUM 8.3* 02/09/2012 0415   GFRNONAA 77* 02/09/2012 0415   GFRAA 89* 02/09/2012 0415   COAG Lab Results  Component Value Date   INR  1.00 02/01/2012   INR 0.89 12/06/2010     Disposition:  Discharge to :Home Discharge Orders    Future Orders Please Complete By Expires   Resume previous diet      Driving Restrictions      Comments:   No driving for 2 weeks   Lifting restrictions      Comments:   No lifting for 6 weeks   Call MD for:  temperature >100.5      Call MD for:  redness, tenderness, or signs of infection (pain, swelling, bleeding, redness, odor or green/yellow discharge around incision site)      Call MD for:  severe or increased pain, loss or decreased feeling  in affected limb(s)      Increase activity slowly      Comments:   Walk with assistance use walker or cane as needed   May shower          M[place], Pates  Home Medication Instructions [id number]   Printed on:02/09/12 0731  Medication Information                    loratadine (CLARITIN) 10 MG tablet Take 10 mg by mouth daily as needed. For allergies           levothyroxine (SYNTHROID, LEVOTHROID) 125 MCG tablet Take 125 mcg by mouth daily.  hydrochlorothiazide (HYDRODIURIL) 25 MG tablet Take 25 mg by mouth daily.             digoxin (LANOXIN) 0.125 MG tablet Take 125 mcg by mouth daily.             lisinopril (PRINIVIL,ZESTRIL) 5 MG tablet Take 5 mg by mouth daily.             potassium chloride (KLOR-CON) 20 MEQ packet Take 20 mEq by mouth daily.            Calcium Carbonate-Vitamin D (CALCIUM + D PO) Take 1 tablet by mouth daily.            fish oil-omega-3 fatty acids 1000 MG capsule Take 1 g by mouth daily.            aspirin EC 325 MG tablet Take 325 mg by mouth daily.            dorzolamide-timolol (COSOPT) 22.3-6.8 MG/ML ophthalmic solution Place 1 drop into the right eye 2 (two) times daily.            scopolamine (HYOSCINE) 0.25 % ophthalmic solution Place 1 drop into the right eye daily.            atropine 1 % ophthalmic solution Place 1 drop into the right eye daily.            Verbal and  written Discharge instructions given to the patient. Wound care per Discharge AVS F/U with Dr. [name] in 4 weeks.  SignedMosetta Pigeon 02/09/2012, 7:31 AM

## 2012-02-09 NOTE — Discharge Summary (Signed)
Agree with above Ready for d/c  Durene Cal

## 2012-02-09 NOTE — Progress Notes (Addendum)
VASCULAR AND VEIN SURGERY POST - OP [facility] PROGRESS NOTE  Date of Surgery: 02/08/2012 Surgeon: Surgeon(s): Nada Libman, MD 1 Day Post-Op left Carotid Endarterectomy .  HPI: LTANYA BAYLEY is a 78 y.o. female who is 1 Day Post-Op left Carotid Endarterectomy . Patient is doing well. Pre-operative symptoms are Improved Patient denies headache; Patient denies difficulty swallowing; denies weakness in upper or lower extremities; Pt. denies other symptoms of stroke or TIA.  IMAGING: No results found.  Significant Diagnostic Studies: CBC Lab Results  Component Value Date   WBC 6.3 02/09/2012   HGB 11.2* 02/09/2012   HCT 33.4* 02/09/2012   MCV 91.0 02/09/2012   PLT 161 02/09/2012    BMET    Component Value Date/Time   NA 136 02/09/2012 0415   K 3.1* 02/09/2012 0415   CL 99 02/09/2012 0415   CO2 27 02/09/2012 0415   GLUCOSE 103* 02/09/2012 0415   BUN 11 02/09/2012 0415   CREATININE 0.60 02/09/2012 0415   CALCIUM 8.3* 02/09/2012 0415   GFRNONAA 77* 02/09/2012 0415   GFRAA 89* 02/09/2012 0415    COAG Lab Results  Component Value Date   INR 1.00 02/01/2012   INR 0.89 12/06/2010   No results found for this basename: PTT      Intake/Output Summary (Last 24 hours) at 02/09/12 0729 Last data filed at 02/09/12 0300  Gross per 24 hour  Intake 2960.42 ml  Output    825 ml  Net 2135.42 ml    Physical Exam:  BP Readings from Last 3 Encounters:  02/09/12 135/48  02/09/12 135/48  02/01/12 131/71   Temp Readings from Last 3 Encounters:  02/09/12 98.4 F (36.9 C) Oral  02/09/12 98.4 F (36.9 C) Oral  02/01/12 98.1 F (36.7 C)    SpO2 Readings from Last 3 Encounters:  02/09/12 100%  02/09/12 100%  02/01/12 95%   Pulse Readings from Last 3 Encounters:  02/09/12 75  02/09/12 75  02/01/12 72    Pt is A&O x 3 Gait is normal Speech is fluent left Neck Wound is clean, dry, intact or healing well Patient with Negative tongue deviation and Negative facial droop Pt has good  and equal strength in all extremities  Assessment: AYVEN GLASCO is a 78 y.o. female is S/P Left Carotid endarterectomy Pt is voiding, ambulating and taking po well   Plan: Discharge to: Home Follow-up in 4 weeks   Clinton Gallant [facility] [phone] 02/09/2012 7:29 AM   agree with above Pt seen and examined Neuro intact  Wells Wray Goehring

## 2012-02-09 NOTE — Progress Notes (Signed)
Patient is discharge home with daughter and son. IV removed. Have gone over discharge instructions with patient as well as daughter and son. No further questions or concerns voiced.

## 2012-02-11 ENCOUNTER — Telehealth: Payer: Self-pay | Admitting: Surgery

## 2012-02-11 NOTE — Telephone Encounter (Signed)
Message copied by Fredrich Birks on Mon Feb 11, 2012  2:31 PM ------      Message from: Phillips Odor      Created: Mon Feb 11, 2012 11:19 AM      Regarding: f/u appt w/ [facility]                   ----- Message -----         From: Melene Plan, RN         Sent: 02/11/2012  10:29 AM           To: Erenest Blank, RN                        ----- Message -----         From: Lars Mage, PA         Sent: 02/09/2012   7:30 AM           To: Melene Plan, RN            F/U with [name] 4 weeks s/p carotid.

## 2012-02-11 NOTE — Telephone Encounter (Signed)
lvm for pt re: follow up appt, dpm

## 2012-02-14 ENCOUNTER — Encounter (HOSPITAL_COMMUNITY): Payer: Self-pay | Admitting: Surgery

## 2012-03-14 ENCOUNTER — Encounter: Payer: Self-pay | Admitting: Surgery

## 2012-03-17 ENCOUNTER — Encounter: Payer: Self-pay | Admitting: Surgery

## 2012-03-17 ENCOUNTER — Ambulatory Visit (INDEPENDENT_AMBULATORY_CARE_PROVIDER_SITE_OTHER): Payer: Medicare Other | Admitting: Surgery

## 2012-03-17 VITALS — BP 134/53 | HR 76 | Temp 98.3°F | Ht 62.0 in | Wt 126.0 lb

## 2012-03-17 DIAGNOSIS — I6529 Occlusion and stenosis of unspecified carotid artery: Secondary | ICD-10-CM

## 2012-03-17 DIAGNOSIS — Z48812 Encounter for surgical aftercare following surgery on the circulatory system: Secondary | ICD-10-CM

## 2012-03-17 NOTE — Progress Notes (Signed)
The patient is here today for followup. She has a history of right carotid endarterectomy. This was done following acute retinal artery occlusion. I have been following her for surveillance purposes. At her last ultrasound we identified a mobile plaque within her left carotid artery. After a lengthy discussion we decided to proceed with endarterectomy to decrease her risk of stroke. In June of 2013 she underwent left carotid endarterectomy. She was seen and evaluated by Dr. Haroldine Laws to make sure her vocal cords were symmetric. Her operation was successful. Her postoperative course was uncomplicated. She is here today without complaints. Specifically she denies any neurologic deficits. She has no trouble with swallowing.  On examination her incision is healing nicely. Her tongue is midline. Her smile is symmetric. Neurologically she is intact.  The patient will be scheduled to come back in 6 months with a carotid duplex.

## 2012-03-18 NOTE — Addendum Note (Signed)
Addended by: Sharee Pimple on: 03/18/2012 08:17 AM   Modules accepted: Orders

## 2012-03-21 ENCOUNTER — Other Ambulatory Visit: Payer: Self-pay | Admitting: Dermatology

## 2012-04-24 ENCOUNTER — Other Ambulatory Visit: Payer: Self-pay | Admitting: Internal Medicine

## 2012-04-24 DIAGNOSIS — Z1231 Encounter for screening mammogram for malignant neoplasm of breast: Secondary | ICD-10-CM

## 2012-04-24 DIAGNOSIS — Z9011 Acquired absence of right breast and nipple: Secondary | ICD-10-CM

## 2012-05-27 ENCOUNTER — Ambulatory Visit
Admission: RE | Admit: 2012-05-27 | Discharge: 2012-05-27 | Disposition: A | Discharge status: Home / Self Care | Payer: Medicare Other | Source: Ambulatory Visit | Attending: Internal Medicine | Admitting: Internal Medicine

## 2012-05-27 DIAGNOSIS — Z1231 Encounter for screening mammogram for malignant neoplasm of breast: Secondary | ICD-10-CM

## 2012-05-27 DIAGNOSIS — Z9011 Acquired absence of right breast and nipple: Secondary | ICD-10-CM

## 2012-06-20 DIAGNOSIS — W010XXA Fall on same level from slipping, tripping and stumbling without subsequent striking against object, initial encounter: Secondary | ICD-10-CM

## 2012-06-20 HISTORY — DX: Fall on same level from slipping, tripping and stumbling without subsequent striking against object, initial encounter: W01.0XXA

## 2012-07-06 ENCOUNTER — Emergency Department (HOSPITAL_COMMUNITY)
Admission: EM | Admit: 2012-07-06 | Discharge: 2012-07-06 | Disposition: A | Discharge status: Home / Self Care | Payer: Medicare Other | Attending: Emergency Medicine | Admitting: Emergency Medicine

## 2012-07-06 ENCOUNTER — Emergency Department (HOSPITAL_COMMUNITY): Payer: Medicare Other

## 2012-07-06 ENCOUNTER — Encounter (HOSPITAL_COMMUNITY): Payer: Self-pay | Admitting: *Deleted

## 2012-07-06 DIAGNOSIS — Z85828 Personal history of other malignant neoplasm of skin: Secondary | ICD-10-CM | POA: Insufficient documentation

## 2012-07-06 DIAGNOSIS — R296 Repeated falls: Secondary | ICD-10-CM | POA: Insufficient documentation

## 2012-07-06 DIAGNOSIS — I10 Essential (primary) hypertension: Secondary | ICD-10-CM | POA: Insufficient documentation

## 2012-07-06 DIAGNOSIS — Z79899 Other long term (current) drug therapy: Secondary | ICD-10-CM | POA: Insufficient documentation

## 2012-07-06 DIAGNOSIS — Z8679 Personal history of other diseases of the circulatory system: Secondary | ICD-10-CM | POA: Insufficient documentation

## 2012-07-06 DIAGNOSIS — Z85038 Personal history of other malignant neoplasm of large intestine: Secondary | ICD-10-CM | POA: Insufficient documentation

## 2012-07-06 DIAGNOSIS — Y929 Unspecified place or not applicable: Secondary | ICD-10-CM | POA: Insufficient documentation

## 2012-07-06 DIAGNOSIS — Z8739 Personal history of other diseases of the musculoskeletal system and connective tissue: Secondary | ICD-10-CM | POA: Insufficient documentation

## 2012-07-06 DIAGNOSIS — E079 Disorder of thyroid, unspecified: Secondary | ICD-10-CM | POA: Insufficient documentation

## 2012-07-06 DIAGNOSIS — Z853 Personal history of malignant neoplasm of breast: Secondary | ICD-10-CM | POA: Insufficient documentation

## 2012-07-06 DIAGNOSIS — Z7982 Long term (current) use of aspirin: Secondary | ICD-10-CM | POA: Insufficient documentation

## 2012-07-06 DIAGNOSIS — Y9301 Activity, walking, marching and hiking: Secondary | ICD-10-CM | POA: Insufficient documentation

## 2012-07-06 DIAGNOSIS — M533 Sacrococcygeal disorders, not elsewhere classified: Secondary | ICD-10-CM | POA: Insufficient documentation

## 2012-07-06 MED ORDER — TRAMADOL HCL 50 MG PO TABS: 50.0000 mg | ORAL_TABLET | Freq: Three times a day (TID) | ORAL | Status: DC | PRN

## 2012-07-06 NOTE — ED Provider Notes (Signed)
History  This chart was scribed for Brandy Razor, MD by Ladona Ridgel Day, ED scribe. This patient was seen in room TR11C/TR11C and the patient's care was started at 1540.   CSN: [id number]  Arrival date & time 07/06/12  1540   First MD Initiated Contact with Patient 07/06/12 1700      Chief Complaint  Patient presents with  . Fall   Patient is a 78 y.o. female presenting with fall. The history is provided by the patient. No language interpreter was used.  Fall The accident occurred more than 2 days ago. The fall occurred while walking. She landed on a hard floor. There was no blood loss. The point of impact was the left hip and right hip. Pain location: tailbone. The pain is mild. She was ambulatory at the scene. There was no entrapment after the fall. Pertinent negatives include no fever, no numbness, no nausea and no vomiting. The symptoms are aggravated by activity. She has tried acetaminophen for the symptoms. The treatment provided mild relief.   Brandy Huff is a 78 y.o. female who presents to the Emergency Department complaining of constant tailbone pain after she fell today on her buttocks 6 days ago, un witnessed fall. Pt ambulatory with her walker which she normally uses. She denies other injuries. She denies hitting her head or LOC  Past Medical History  Diagnosis Date  . Hypertension   . Arthritis   . Carotid artery occlusion   . Thyroid disease   . Cancer     Breast  . Cancer     Colon  . Cancer     Skin  . Atrial fibrillation   . PONV (postoperative nausea and vomiting)     Past Surgical History  Procedure Date  . Cataract extraction     bilateral  . Hernia repair     Left Ventral hernia  . Joint replacement 2005    Bilateral knee replacements  . Mastectomy     Right Breast  . Appendectomy   . Colon surgery     Colectomy X 2  . Abdominal hysterectomy   . Carotid endarterectomy 2012    right  . Endarterectomy 02/08/2012    Procedure: ENDARTERECTOMY  CAROTID;  Surgeon: Nada Libman, MD;  Location: [facility] OR;  Service: Vascular;  Laterality: Left;  and Patch Angioplasty    Family History  Problem Relation Age of Onset  . Heart murmur Sister   . Heart disease Mother   . Cancer Daughter   . Hyperlipidemia Daughter   . Cancer Son   . Diabetes Son   . Hyperlipidemia Son   . Hypertension Son   . Heart attack Son     History  Substance Use Topics  . Smoking status: Never Smoker   . Smokeless tobacco: Current User    Types: Snuff  . Alcohol Use: No    OB History    Grav Para Term Preterm Abortions TAB SAB Ect Mult Living                  Review of Systems  Constitutional: Negative for fever and chills.  HENT: Negative for neck stiffness.   Respiratory: Negative for shortness of breath.   Gastrointestinal: Negative for nausea and vomiting.  Musculoskeletal: Positive for back pain (tailbone pain).  Neurological: Negative for weakness and numbness.  All other systems reviewed and are negative.    Allergies  Review of patient's allergies indicates no known allergies.  Home Medications  Current Outpatient Rx  Name  Route  Sig  Dispense  Refill  . ASPIRIN EC 325 MG PO TBEC   Oral   Take 325 mg by mouth daily.          . ATROPINE SULFATE 1 % OP SOLN   Right Eye   Place 1 drop into the right eye daily.         Marland Kitchen BACITRACIN 500 UNIT/GM OP OINT   Right Eye   Place 1 application into the right eye daily.         Marland Kitchen CALCIUM + D PO   Oral   Take 1 tablet by mouth daily.          Marland Kitchen DIGOXIN 0.125 MG PO TABS   Oral   Take 125 mcg by mouth daily.           . DORZOLAMIDE HCL-TIMOLOL MAL 22.3-6.8 MG/ML OP SOLN   Right Eye   Place 1 drop into the right eye 2 (two) times daily.          . OMEGA-3 FATTY ACIDS 1000 MG PO CAPS   Oral   Take 1 g by mouth daily.          Marland Kitchen HYDROCHLOROTHIAZIDE 25 MG PO TABS   Oral   Take 25 mg by mouth daily.           Marland Kitchen LEVOTHYROXINE SODIUM 125 MCG PO TABS   Oral    Take 125 mcg by mouth daily.           Marland Kitchen SALONPAS EX   Apply externally   Apply 1 application topically once. For pain         . LISINOPRIL 5 MG PO TABS   Oral   Take 5 mg by mouth daily.           Marland Kitchen LORATADINE 10 MG PO TABS   Oral   Take 10 mg by mouth daily as needed. For allergies         . SCOPOLAMINE HBR 0.25 % OP SOLN   Right Eye   Place 1 drop into the right eye daily.          . TRAMADOL HCL 50 MG PO TABS   Oral   Take 1 tablet (50 mg total) by mouth every 8 (eight) hours as needed for pain.   12 tablet   0     Triage vitals: BP 162/47  Pulse 89  Temp 98.4 F (36.9 C) (Oral)  Resp 14  SpO2 98%  Physical Exam  Nursing note and vitals reviewed. Constitutional: She appears well-developed and well-nourished. No distress.  HENT:  Head: Normocephalic and atraumatic.  Eyes: Conjunctivae normal are normal. Right eye exhibits no discharge. Left eye exhibits no discharge.  Neck: Neck supple.  Cardiovascular: Normal rate, regular rhythm and normal heart sounds.  Exam reveals no gallop and no friction rub.   No murmur heard. Pulmonary/Chest: Effort normal and breath sounds normal. No respiratory distress.  Abdominal: Soft. She exhibits no distension. There is no tenderness.  Musculoskeletal: She exhibits tenderness. She exhibits no edema.       Moderate lower midline coccyx tenderness. No overlying skin changes. Normal ROM both hips/knees. NVI distally   Neurological: She is alert.  Skin: Skin is warm and dry.  Psychiatric: She has a normal mood and affect. Her behavior is normal. Thought content normal.    ED Course  Procedures (including critical care time) DIAGNOSTIC STUDIES: Oxygen Saturation  is 98% on room air, normal by my interpretation.    COORDINATION OF CARE: At 505 PM Discussed treatment plan with patient which includes left/right hip X-ray. Patient agrees.   Labs Reviewed - No data to display Dg Hip Complete Right  07/06/2012  *RADIOLOGY  REPORT*  Clinical Data: Fall, right hip pain  RIGHT HIP - COMPLETE 2+ VIEW  Comparison: None.  Findings: No fracture or dislocation is seen.  The bilateral hip joints are narrowed but symmetric.  The visualized bony pelvis appears intact.  Degenerative changes of the lower lumbar spine.  Vascular calcifications.  IMPRESSION: No fracture or dislocation is seen.   Original Report Authenticated By: Charline Bills, M.D.      1. Pain in the coccyx       MDM  [id number] with coccyx pain. Tenderness on exam. Imaging of hip neg. Did not order dedicated sacral/coccyx films, but would not change management if fx noted. NVI. Can ambulate. Plan symptomatic tx.  I personally preformed the services scribed in my presence. The recorded information has been reviewed and is accurate. Brandy Razor, MD.          Brandy Razor, MD 07/10/12 [phone]

## 2012-07-06 NOTE — ED Notes (Addendum)
Reports having a fall on Monday, still having pain to right buttock/hip.

## 2012-09-15 ENCOUNTER — Other Ambulatory Visit: Payer: Medicare Other

## 2012-09-15 ENCOUNTER — Ambulatory Visit: Payer: Medicare Other | Admitting: Surgery

## 2012-09-22 ENCOUNTER — Ambulatory Visit: Payer: Medicare Other | Admitting: Surgery

## 2012-09-26 ENCOUNTER — Encounter: Payer: Self-pay | Admitting: Surgery

## 2012-09-29 ENCOUNTER — Other Ambulatory Visit: Payer: Self-pay | Admitting: *Deleted

## 2012-09-29 ENCOUNTER — Other Ambulatory Visit (INDEPENDENT_AMBULATORY_CARE_PROVIDER_SITE_OTHER): Payer: Medicare Other | Admitting: *Deleted

## 2012-09-29 ENCOUNTER — Encounter: Payer: Self-pay | Admitting: Surgery

## 2012-09-29 ENCOUNTER — Ambulatory Visit (INDEPENDENT_AMBULATORY_CARE_PROVIDER_SITE_OTHER): Payer: Medicare Other | Admitting: Surgery

## 2012-09-29 VITALS — BP 136/71 | HR 80 | Ht 62.0 in | Wt 121.0 lb

## 2012-09-29 DIAGNOSIS — I6529 Occlusion and stenosis of unspecified carotid artery: Secondary | ICD-10-CM

## 2012-09-29 DIAGNOSIS — Z48812 Encounter for surgical aftercare following surgery on the circulatory system: Secondary | ICD-10-CM

## 2012-09-29 NOTE — Progress Notes (Signed)
[facility] [place]   Patient name: Brandy Huff MRN: [id number] DOB: [birth year] Sex: female     Chief Complaint  Patient presents with  . Carotid    6 month f/up with vascular Lab study.    HISTORY OF PRESENT ILLNESS: The patient is back today for followup. She is status post right carotid endarterectomy on 12/08/2010, done in the setting of symptomatic right carotid stenosis. She also underwent left carotid endarterectomy on 02/08/2012 for asymptomatic stenosis. She is back today for followup. Since I last saw her she has had a fall. She denies any neurologic symptoms. Specifically, she denies numbness or weakness in either extremity. She denies slurred speech. She denies amaurosis fugax.  Past Medical History  Diagnosis Date  . Hypertension   . Arthritis   . Carotid artery occlusion   . Thyroid disease   . Cancer     Breast  . Cancer     Colon  . Cancer     Skin  . Atrial fibrillation   . PONV (postoperative nausea and vomiting)   . Fall from slipping Nov. 2013    Past Surgical History  Procedure Laterality Date  . Cataract extraction      bilateral  . Hernia repair      Left Ventral hernia  . Joint replacement  2005    Bilateral knee replacements  . Mastectomy      Right Breast  . Appendectomy    . Colon surgery      Colectomy X 2  . Abdominal hysterectomy    . Carotid endarterectomy  2012    right  . Endarterectomy  02/08/2012    Procedure: ENDARTERECTOMY CAROTID;  Surgeon: Nada Libman, MD;  Location: [facility] OR;  Service: Vascular;  Laterality: Left;  and Patch Angioplasty    History   Social History  . Marital Status: Widowed    Spouse Name: N/A    Number of Children: N/A  . Years of Education: N/A   Occupational History  . Not on file.   Social History Main Topics  . Smoking status: Never Smoker   . Smokeless tobacco: Current User    Types: Snuff  . Alcohol Use: No  . Drug Use: No  . Sexually Active: Not on file    Other Topics Concern  . Not on file   Social History Narrative  . No narrative on file    Family History  Problem Relation Age of Onset  . Heart murmur Sister   . Heart disease Mother   . Cancer Daughter   . Hyperlipidemia Daughter   . Cancer Son   . Diabetes Son   . Hyperlipidemia Son   . Hypertension Son   . Heart attack Son   . Deep vein thrombosis Son     Allergies as of 09/29/2012  . (No Known Allergies)    Current Outpatient Prescriptions on File Prior to Visit  Medication Sig Dispense Refill  . aspirin EC 325 MG tablet Take 325 mg by mouth daily.       Marland Kitchen atropine 1 % ophthalmic solution Place 1 drop into the right eye daily.      . bacitracin ophthalmic ointment Place 1 application into the right eye daily.      . Calcium Carbonate-Vitamin D (CALCIUM + D PO) Take 1 tablet by mouth daily.       . digoxin (LANOXIN) 0.125 MG tablet Take 125 mcg by mouth daily.        Marland Kitchen  dorzolamide-timolol (COSOPT) 22.3-6.8 MG/ML ophthalmic solution Place 1 drop into the right eye 2 (two) times daily.       . fish oil-omega-3 fatty acids 1000 MG capsule Take 1 g by mouth daily.       . hydrochlorothiazide (HYDRODIURIL) 25 MG tablet Take 25 mg by mouth daily.        Marland Kitchen levothyroxine (SYNTHROID, LEVOTHROID) 125 MCG tablet Take 125 mcg by mouth daily.        . Liniments (SALONPAS EX) Apply 1 application topically once. For pain      . lisinopril (PRINIVIL,ZESTRIL) 5 MG tablet Take 5 mg by mouth daily.        Marland Kitchen loratadine (CLARITIN) 10 MG tablet Take 10 mg by mouth daily as needed. For allergies      . scopolamine (HYOSCINE) 0.25 % ophthalmic solution Place 1 drop into the right eye daily.       . traMADol (ULTRAM) 50 MG tablet Take 1 tablet (50 mg total) by mouth every 8 (eight) hours as needed for pain.  12 tablet  0   No current facility-administered medications on file prior to visit.     REVIEW OF SYSTEMS: Please see history of present illness, otherwise no changes since last  visit.  PHYSICAL EXAMINATION:   Vital signs are BP 136/71  Pulse 80  Ht 5\' 2"  (1.575 m)  Wt 121 lb (54.885 kg)  BMI 22.13 kg/m2  SpO2 98% General: The patient appears their stated age. HEENT:  No gross abnormalities Pulmonary:  Non labored breathing Musculoskeletal: There are no major deformities. Neurologic: No focal weakness or paresthesias are detected, Skin: There are no ulcer or rashes noted. Psychiatric: The patient has normal affect. Cardiovascular: There is a regular rate and rhythm without significant murmur appreciated. No carotid bruits   Diagnostic Studies Carotid duplex was performed today. I have reviewed the study. This is widely patent bilateral carotid endarterectomy sites.  Assessment: Status post bilateral carotid endarterectomy Plan: The patient is doing very well at this time. She has no neurologic symptoms. She will followup in one year with a repeat carotid  V. Charlena Cross, M.D. [facility] [place] Office: [phone] Pager:  [phone]

## 2013-05-11 ENCOUNTER — Encounter: Payer: Self-pay | Admitting: *Deleted

## 2013-05-11 DIAGNOSIS — B351 Tinea unguium: Secondary | ICD-10-CM | POA: Insufficient documentation

## 2013-05-21 ENCOUNTER — Ambulatory Visit: Payer: Self-pay | Admitting: Podiatry

## 2013-05-27 ENCOUNTER — Encounter: Payer: Self-pay | Admitting: Podiatry

## 2013-05-27 ENCOUNTER — Ambulatory Visit (INDEPENDENT_AMBULATORY_CARE_PROVIDER_SITE_OTHER): Payer: Medicare Other | Admitting: Podiatry

## 2013-05-27 VITALS — BP 149/88 | HR 82 | Resp 12 | Ht 62.0 in | Wt 130.0 lb

## 2013-05-27 DIAGNOSIS — B351 Tinea unguium: Secondary | ICD-10-CM

## 2013-05-27 DIAGNOSIS — M79609 Pain in unspecified limb: Secondary | ICD-10-CM

## 2013-05-28 NOTE — Progress Notes (Signed)
Subjective:     Patient ID: Brandy Huff, female   DOB: 11/25/1917, 79 y.o.   MRN: [id number]  HPI my nails are thick and hard for me to cut. They do become painful   Review of Systems  All other systems reviewed and are negative.       Objective:   Physical Exam  Nursing note and vitals reviewed. Cardiovascular: Intact distal pulses.   Neurological: Brandy Huff is alert.   thick nails with pain 1-5 bilateral    Assessment:     I chronic nail infection with pain 1-5 bilateral.    Plan:     Cutting of toenails 1-5 bilateral no iatrogenic bleeding noted.

## 2013-08-06 ENCOUNTER — Other Ambulatory Visit: Payer: Self-pay | Admitting: Dermatology

## 2013-08-27 ENCOUNTER — Encounter: Payer: Self-pay | Admitting: Podiatry

## 2013-08-27 ENCOUNTER — Ambulatory Visit (INDEPENDENT_AMBULATORY_CARE_PROVIDER_SITE_OTHER): Payer: Medicare Other | Admitting: Podiatry

## 2013-08-27 VITALS — BP 128/74 | HR 89 | Resp 14

## 2013-08-27 DIAGNOSIS — M79609 Pain in unspecified limb: Secondary | ICD-10-CM

## 2013-08-27 DIAGNOSIS — B351 Tinea unguium: Secondary | ICD-10-CM

## 2013-08-27 NOTE — Progress Notes (Signed)
Subjective:     Patient ID: Brandy Huff, female   DOB: 03-27-1918, 80 y.o.   MRN: [id number]  HPI patient presents with caregiver stating my nails are so thick and painful and I cannot cut them   Review of Systems     Objective:   Physical Exam Neurovascular status unchanged with thick painful nailbeds 1-5 both feet    Assessment:     Mycotic nail infection with pain 1-5 both feet    Plan:     Debridement painful nailbeds 1-5 both feet

## 2013-09-15 ENCOUNTER — Other Ambulatory Visit: Payer: Self-pay | Admitting: Internal Medicine

## 2013-09-15 DIAGNOSIS — Z853 Personal history of malignant neoplasm of breast: Secondary | ICD-10-CM

## 2013-09-15 DIAGNOSIS — Z9011 Acquired absence of right breast and nipple: Secondary | ICD-10-CM

## 2013-09-15 DIAGNOSIS — Z1231 Encounter for screening mammogram for malignant neoplasm of breast: Secondary | ICD-10-CM

## 2013-09-25 ENCOUNTER — Encounter: Payer: Self-pay | Admitting: Family

## 2013-09-28 ENCOUNTER — Ambulatory Visit (HOSPITAL_COMMUNITY)
Admission: RE | Admit: 2013-09-28 | Discharge: 2013-09-28 | Disposition: A | Discharge status: Home / Self Care | Payer: Medicare Other | Source: Ambulatory Visit | Attending: Family | Admitting: Family

## 2013-09-28 ENCOUNTER — Ambulatory Visit (INDEPENDENT_AMBULATORY_CARE_PROVIDER_SITE_OTHER): Payer: Medicare Other | Admitting: Family

## 2013-09-28 ENCOUNTER — Encounter: Payer: Self-pay | Admitting: Family

## 2013-09-28 VITALS — BP 158/79 | HR 77 | Temp 98.0°F | Resp 18 | Ht 63.0 in | Wt 116.1 lb

## 2013-09-28 DIAGNOSIS — I658 Occlusion and stenosis of other precerebral arteries: Secondary | ICD-10-CM | POA: Insufficient documentation

## 2013-09-28 DIAGNOSIS — Z48812 Encounter for surgical aftercare following surgery on the circulatory system: Secondary | ICD-10-CM

## 2013-09-28 DIAGNOSIS — I6529 Occlusion and stenosis of unspecified carotid artery: Secondary | ICD-10-CM

## 2013-09-28 DIAGNOSIS — Z9889 Other specified postprocedural states: Secondary | ICD-10-CM | POA: Insufficient documentation

## 2013-09-28 NOTE — Progress Notes (Signed)
Established Carotid Patient   History of Present Illness  Brandy Huff is a 80 y.o. female patient of Dr. Trula Slade who is status post right carotid endarterectomy on 12/08/2010, done in the setting of symptomatic right carotid stenosis. She also underwent left carotid endarterectomy on 02/08/2012 for asymptomatic stenosis. She returns today for follow up. Had a right occular stroke in 2012, just before the CEA, no further stroke activity, has loss of vision in right eye.    The patient  denies facial drooping. Pt. denies hemiplegia.  The patient denies receptive or expressive aphasia.  Pt. denies extremity weakness.  Pt denies New Medical or Surgical History.  Pt Diabetic: No Pt smoker: non-smoker  Pt meds include: Statin : No ASA: Yes Other anticoagulants/antiplatelets: no   Past Medical History  Diagnosis Date  . Hypertension   . Arthritis   . Carotid artery occlusion   . Thyroid disease   . Cancer     Breast  . Cancer     Colon  . Cancer     Skin  . Atrial fibrillation   . PONV (postoperative nausea and vomiting)   . Fall from slipping Nov. 2013  . Dermatophytosis of nail     Social History History  Substance Use Topics  . Smoking status: Never Smoker   . Smokeless tobacco: Current User    Types: Snuff  . Alcohol Use: No    Family History Family History  Problem Relation Age of Onset  . Heart murmur Sister   . Heart disease Mother   . Cancer Daughter   . Hyperlipidemia Daughter   . Cancer Son   . Diabetes Son   . Hyperlipidemia Son   . Hypertension Son   . Heart attack Son   . Deep vein thrombosis Son     Surgical History Past Surgical History  Procedure Laterality Date  . Cataract extraction      bilateral  . Hernia repair      Left Ventral hernia  . Joint replacement  2005    Bilateral knee replacements  . Mastectomy      Right Breast  . Appendectomy    . Colon surgery      Colectomy X 2  . Abdominal hysterectomy    . Carotid  endarterectomy  2012    right  . Endarterectomy  02/08/2012    Procedure: ENDARTERECTOMY CAROTID;  Surgeon: Serafina Mitchell, MD;  Location: [facility] OR;  Service: Vascular;  Laterality: Left;  and Patch Angioplasty    No Known Allergies  Current Outpatient Prescriptions  Medication Sig Dispense Refill  . aspirin EC 325 MG tablet Take 325 mg by mouth daily.       Marland Kitchen atropine 1 % ophthalmic solution Place 1 drop into the right eye daily.      . bacitracin ophthalmic ointment Place 1 application into the right eye daily.      . Calcium Carbonate-Vitamin D (CALCIUM + D PO) Take 1 tablet by mouth daily.       . digoxin (LANOXIN) 0.125 MG tablet Take 125 mcg by mouth daily.        . dorzolamide-timolol (COSOPT) 22.3-6.8 MG/ML ophthalmic solution Place 1 drop into the right eye 2 (two) times daily.       . hydrochlorothiazide (HYDRODIURIL) 25 MG tablet Take 25 mg by mouth daily.        Marland Kitchen KLOR-CON M20 20 MEQ tablet       . levothyroxine (SYNTHROID, LEVOTHROID) 125 MCG  tablet Take 125 mcg by mouth daily.        . Liniments (SALONPAS EX) Apply 1 application topically once. For pain      . lisinopril (PRINIVIL,ZESTRIL) 5 MG tablet Take 5 mg by mouth daily.        Marland Kitchen loratadine (CLARITIN) 10 MG tablet Take 10 mg by mouth daily as needed. For allergies      . scopolamine (HYOSCINE) 0.25 % ophthalmic solution Place 1 drop into the right eye daily.       . fish oil-omega-3 fatty acids 1000 MG capsule Take 1 g by mouth daily.       Marland Kitchen tobramycin (TOBREX) 0.3 % ophthalmic solution       . traMADol (ULTRAM) 50 MG tablet Take 1 tablet (50 mg total) by mouth every 8 (eight) hours as needed for pain.  12 tablet  0   No current facility-administered medications for this visit.    Review of Systems : See HPI for pertinent positives and negatives.  Physical Examination  Filed Vitals:   09/28/13 1355  BP: 158/79  Pulse: 77  Temp: 98 F (36.7 C)  Resp: 18   Filed Weights   09/28/13 1355  Weight: 116 lb 1.6  oz (52.663 kg)   Body mass index is 20.57 kg/(m^2).  General: WDWN female in NAD GAIT: slow, deliberate Eyes: Right pupil larger than left, had a right occular stroke Pulmonary:  Non-labored, CTAB, Negative  Rales, Negative rhonchi, & Negative wheezing.  Cardiac: regular Rhythm ,  No detected murmur.  VASCULAR EXAM Carotid Bruits Left Right   Negative Negative    Radial pulses are 2+ palpable and equal.                                                                                                                            LE Pulses LEFT RIGHT       POPLITEAL  not palpable   not palpable       POSTERIOR TIBIAL   palpable    palpable        DORSALIS PEDIS      ANTERIOR TIBIAL not palpable  not palpable     Gastrointestinal: soft, nontender, BS WNL, no r/g,  negative masses.  Musculoskeletal: age appropriate muscle atrophy/wasting. M/S 4/5 throughout, Extremities without ischemic changes.  Neurologic: A&O X 3; Appropriate Affect ; SENSATION ;normal;  Speech is normal CN 2-12 intact except right pupil is larger than left (right occular stroke), Pain and light touch intact in extremities, Motor exam as listed above.   Non-Invasive Vascular Imaging CAROTID DUPLEX 09/28/2013   Widely patent bilateral ICA's.  These findings are Unchanged from previous exam.  Assessment: Brandy Huff is a 80 y.o. female who presents with asymptomatic widely patent bilateral ICA's s/p bilateral CEA's. The  ICA stenosis is  Unchanged from previous exam.  Plan: Follow-up in 1 year with Carotid Duplex scan.  I discussed in depth with the patient  the nature of atherosclerosis, and emphasized the importance of maximal medical management including strict control of blood pressure, blood glucose, and lipid levels, obtaining regular exercise, and cessation of smoking.  The patient is aware that without maximal medical management the underlying atherosclerotic disease process will progress,  limiting the benefit of any interventions. The patient was given information about stroke prevention and what symptoms should prompt the patient to seek immediate medical care. Thank you for allowing [place] to participate in this patient's care.  Clemon Chambers, RN, MSN, FNP-C [facility] [place] Office: [phone]  Clinic Physician: Trula Slade  09/28/2013 2:25 PM  VASCULAR QUALITY INITIATIVE FOLLOW UP DATA:  Current smoker: [  ] yes  [ X ] no  Living status: Valu.Nieves  ]  Home  [  ] Nursing home  [  ] Homeless    MEDS:  ASA [ X ] yes  [  ] no- [  ] medical reason  [  ] non compliant  STATIN  [  ] yes  Valu.Nieves  ] no- [  ] medical reason  [  ] non compliant  Beta blocker [  ] yes  [ X ] no- [  ] medical reason  [  ] non compliant  ACE inhibitor Valu.Nieves  ] yes  [  ] no- [  ] medical reason  [  ] non compliant  P2Y12 Antagonist Valu.Nieves  ] none  [  ] clopidogrel-Plavix  [  ] ticlopidine-Ticlid   [  ] prasugrel-Effient  [  ] ticagrelor- Brilinta    Anticoagulant Valu.Nieves  ] None  [  ] warfarin  [  ] rivaroxaban-Xarelto [  ] dabigatran- Pradaxa  Neurologic event since D/C:  [ X ] no  [  ] yes: [  ] eye event  [  ] cortical event  [  ] VB event  [  ] non specific event  [  ] right  [  ] left  [  ] TIA  [  ] stroke  Date:   Modified Rankin Score: 0

## 2013-09-28 NOTE — Patient Instructions (Signed)

## 2013-09-29 ENCOUNTER — Other Ambulatory Visit: Payer: Medicare Other

## 2013-09-29 ENCOUNTER — Ambulatory Visit: Payer: Medicare Other | Admitting: Neurosurgery

## 2013-09-29 NOTE — Addendum Note (Signed)
Addended by: Dorthula Rue L on: 09/29/2013 10:05 AM   Modules accepted: Orders

## 2013-10-07 ENCOUNTER — Ambulatory Visit: Payer: Medicare Other

## 2013-10-21 ENCOUNTER — Ambulatory Visit
Admission: RE | Admit: 2013-10-21 | Discharge: 2013-10-21 | Disposition: A | Discharge status: Home / Self Care | Payer: Self-pay | Source: Ambulatory Visit | Attending: Internal Medicine | Admitting: Internal Medicine

## 2013-10-21 DIAGNOSIS — Z853 Personal history of malignant neoplasm of breast: Secondary | ICD-10-CM

## 2013-10-21 DIAGNOSIS — Z9011 Acquired absence of right breast and nipple: Secondary | ICD-10-CM

## 2013-10-21 DIAGNOSIS — Z1231 Encounter for screening mammogram for malignant neoplasm of breast: Secondary | ICD-10-CM

## 2013-11-19 ENCOUNTER — Ambulatory Visit: Payer: Medicare Other | Admitting: Podiatry

## 2013-12-17 ENCOUNTER — Ambulatory Visit (INDEPENDENT_AMBULATORY_CARE_PROVIDER_SITE_OTHER): Payer: Medicare Other | Admitting: Podiatry

## 2013-12-17 ENCOUNTER — Encounter: Payer: Self-pay | Admitting: Podiatry

## 2013-12-17 DIAGNOSIS — B351 Tinea unguium: Secondary | ICD-10-CM

## 2013-12-17 DIAGNOSIS — M79609 Pain in unspecified limb: Secondary | ICD-10-CM

## 2013-12-18 NOTE — Progress Notes (Signed)
Subjective:     Patient ID: Brandy Huff, female   DOB: [birth year], 80 y.o.   MRN: [id number]  HPI patient presents with thick brittle nailbeds 1-5 both feet that she has no ability to cut   Review of Systems     Objective:   Physical Exam Neurovascular status unchanged well oriented x3 with thick yellow brittle nailbeds 1-5 both feet that are painful when pressed    Assessment:     Chronic mycotic nail infection with pain 1-5 both feet    Plan:     Debridement painful nail bed 1-5 both feet with no bleeding noted

## 2014-02-04 ENCOUNTER — Other Ambulatory Visit: Payer: Self-pay | Admitting: Dermatology

## 2014-03-25 ENCOUNTER — Encounter: Payer: Self-pay | Admitting: Podiatry

## 2014-03-25 ENCOUNTER — Ambulatory Visit (INDEPENDENT_AMBULATORY_CARE_PROVIDER_SITE_OTHER): Payer: Medicare Other | Admitting: Podiatry

## 2014-03-25 DIAGNOSIS — B351 Tinea unguium: Secondary | ICD-10-CM

## 2014-03-25 DIAGNOSIS — M79609 Pain in unspecified limb: Secondary | ICD-10-CM

## 2014-03-25 DIAGNOSIS — M79673 Pain in unspecified foot: Secondary | ICD-10-CM

## 2014-03-25 NOTE — Progress Notes (Signed)
Subjective:     Patient ID: Brandy Huff, female   DOB: March 24, 1918, 80 y.o.   MRN: [id number]  HPI patient presents with thick painful nailbeds 1-5 both feet that she cannot cut herself   Review of Systems     Objective:   Physical Exam Neurovascular status intact with thick brittle yellow nailbeds 1-5 both feet    Assessment:     Mycotic nail infection 1-5 both feet    Plan:     Debride painful nailbeds 1-5 both feet with no iatrogenic bleeding noted

## 2014-06-17 ENCOUNTER — Encounter: Payer: Self-pay | Admitting: Podiatry

## 2014-06-17 ENCOUNTER — Ambulatory Visit (INDEPENDENT_AMBULATORY_CARE_PROVIDER_SITE_OTHER): Payer: Medicare Other | Admitting: Podiatry

## 2014-06-17 DIAGNOSIS — B351 Tinea unguium: Secondary | ICD-10-CM

## 2014-06-17 DIAGNOSIS — M79673 Pain in unspecified foot: Secondary | ICD-10-CM

## 2014-06-17 NOTE — Progress Notes (Signed)
Patient presents with thick nailbeds and pain 1-5 both feet that he cannot cut.  Review of Systems  Objective:   Physical Exam Neurovascular status intact with thick painful nailbeds 1-5 both feet that are painful.  Assessment:  Mycotic nail infection with pain 1-5 both feet.  Plan:  Debridement painful nailbeds 1-5 both feet with no iatrogenic bleeding noted.

## 2014-09-23 ENCOUNTER — Other Ambulatory Visit: Payer: Medicare Other

## 2014-09-27 ENCOUNTER — Ambulatory Visit (HOSPITAL_COMMUNITY)
Admission: RE | Admit: 2014-09-27 | Discharge: 2014-09-27 | Disposition: A | Discharge status: Home / Self Care | Payer: Medicare Other | Source: Ambulatory Visit | Attending: Family | Admitting: Family

## 2014-09-27 DIAGNOSIS — I6523 Occlusion and stenosis of bilateral carotid arteries: Secondary | ICD-10-CM | POA: Insufficient documentation

## 2014-09-27 DIAGNOSIS — Z48812 Encounter for surgical aftercare following surgery on the circulatory system: Secondary | ICD-10-CM

## 2014-10-04 ENCOUNTER — Other Ambulatory Visit (HOSPITAL_COMMUNITY): Payer: Medicare Other

## 2014-10-04 ENCOUNTER — Ambulatory Visit: Payer: Medicare Other | Admitting: Family

## 2014-10-11 ENCOUNTER — Ambulatory Visit (INDEPENDENT_AMBULATORY_CARE_PROVIDER_SITE_OTHER): Payer: Medicare Other | Admitting: Podiatry

## 2014-10-11 DIAGNOSIS — M79673 Pain in unspecified foot: Secondary | ICD-10-CM

## 2014-10-11 DIAGNOSIS — B351 Tinea unguium: Secondary | ICD-10-CM

## 2014-10-11 NOTE — Progress Notes (Signed)
Patient presents with thick nailbeds and pain 1-5 both feet that he cannot cut.  Review of Systems  Objective:   Physical Exam Neurovascular status intact with thick painful nailbeds 1-5 both feet that are painful.  Assessment:  Mycotic nail infection with pain 1-5 both feet.  Plan:  Debridement painful nailbeds 1-5 both feet with no iatrogenic bleeding noted.

## 2014-12-06 ENCOUNTER — Other Ambulatory Visit: Payer: Self-pay

## 2014-12-06 DIAGNOSIS — Z9011 Acquired absence of right breast and nipple: Secondary | ICD-10-CM

## 2014-12-06 DIAGNOSIS — Z1231 Encounter for screening mammogram for malignant neoplasm of breast: Secondary | ICD-10-CM

## 2014-12-22 ENCOUNTER — Ambulatory Visit
Admission: RE | Admit: 2014-12-22 | Discharge: 2014-12-22 | Disposition: A | Discharge status: Home / Self Care | Payer: Medicare Other | Source: Ambulatory Visit

## 2014-12-22 DIAGNOSIS — Z1231 Encounter for screening mammogram for malignant neoplasm of breast: Secondary | ICD-10-CM

## 2014-12-22 DIAGNOSIS — Z9011 Acquired absence of right breast and nipple: Secondary | ICD-10-CM

## 2015-01-24 ENCOUNTER — Ambulatory Visit: Payer: Medicare Other

## 2015-01-25 ENCOUNTER — Encounter: Payer: Self-pay | Admitting: Podiatry

## 2015-01-25 ENCOUNTER — Ambulatory Visit (INDEPENDENT_AMBULATORY_CARE_PROVIDER_SITE_OTHER): Payer: Medicare Other | Admitting: Podiatry

## 2015-01-25 DIAGNOSIS — M79675 Pain in left toe(s): Secondary | ICD-10-CM | POA: Diagnosis not present

## 2015-01-25 DIAGNOSIS — M79674 Pain in right toe(s): Secondary | ICD-10-CM

## 2015-01-25 DIAGNOSIS — M79673 Pain in unspecified foot: Secondary | ICD-10-CM

## 2015-01-25 DIAGNOSIS — B351 Tinea unguium: Secondary | ICD-10-CM

## 2015-01-25 NOTE — Progress Notes (Signed)
Patient presents with thick nailbeds and pain 1-5 both feet that he cannot cut.  Neurovascular status intact with thick painful nailbeds 1-5 both feet that are painful.  Mycotic nail infection with pain 1-5 both feet.  Debridement painful nailbeds 1-5 both feet with no iatrogenic bleeding noted.

## 2015-01-29 ENCOUNTER — Encounter (HOSPITAL_COMMUNITY): Payer: Self-pay | Admitting: Emergency Medicine

## 2015-01-29 ENCOUNTER — Emergency Department (INDEPENDENT_AMBULATORY_CARE_PROVIDER_SITE_OTHER)
Admission: EM | Admit: 2015-01-29 | Discharge: 2015-01-29 | Disposition: A | Discharge status: Home / Self Care | Payer: Medicare Other | Source: Home / Self Care | Attending: Family Medicine | Admitting: Family Medicine

## 2015-01-29 DIAGNOSIS — K219 Gastro-esophageal reflux disease without esophagitis: Secondary | ICD-10-CM | POA: Diagnosis not present

## 2015-01-29 MED ORDER — OMEPRAZOLE 40 MG PO CPDR: 40.0000 mg | DELAYED_RELEASE_CAPSULE | Freq: Every day | ORAL | Status: DC

## 2015-01-29 NOTE — ED Notes (Signed)
C/o acid reflux States patient has been burping a lot lately States she has upper abd pain Is regularly has loose bowels

## 2015-01-29 NOTE — ED Provider Notes (Addendum)
Brandy Huff is a 81 y.o. female who presents to Urgent Care today for indigestion. For the past 2 months patient has had intermittent nausea and burping and mild abdominal discomfort. Her daughters noted this and gave her some Zantac which helped. No fevers or chills chest pains or palpitations. She has a history of colon cancer with partial colectomy. She denies any constipation or blood in the stool.  No urinary symptoms.   Past Medical History  Diagnosis Date  . Hypertension   . Arthritis   . Carotid artery occlusion   . Thyroid disease   . Cancer     Breast  . Cancer     Colon  . Cancer     Skin  . Atrial fibrillation   . PONV (postoperative nausea and vomiting)   . Fall from slipping Nov. 2013  . Dermatophytosis of nail    Past Surgical History  Procedure Laterality Date  . Cataract extraction      bilateral  . Hernia repair      Left Ventral hernia  . Joint replacement  2005    Bilateral knee replacements  . Mastectomy      Right Breast  . Appendectomy    . Colon surgery      Colectomy X 2  . Abdominal hysterectomy    . Carotid endarterectomy  2012    right  . Endarterectomy  02/08/2012    Procedure: ENDARTERECTOMY CAROTID;  Surgeon: Serafina Mitchell, MD;  Location: [facility] OR;  Service: Vascular;  Laterality: Left;  and Patch Angioplasty   History  Substance Use Topics  . Smoking status: Never Smoker   . Smokeless tobacco: Current User    Types: Snuff  . Alcohol Use: No   ROS as above Medications: No current facility-administered medications for this encounter.   Current Outpatient Prescriptions  Medication Sig Dispense Refill  . aspirin EC 325 MG tablet Take 325 mg by mouth daily.     Marland Kitchen atropine 1 % ophthalmic solution Place 1 drop into the right eye daily.    . bacitracin ophthalmic ointment Place 1 application into the right eye daily.    . Calcium Carbonate-Vitamin D (CALCIUM + D PO) Take 1 tablet by mouth daily.     . digoxin (LANOXIN) 0.125 MG  tablet Take 125 mcg by mouth daily.      . dorzolamide-timolol (COSOPT) 22.3-6.8 MG/ML ophthalmic solution Place 1 drop into the right eye 2 (two) times daily.     . fish oil-omega-3 fatty acids 1000 MG capsule Take 1 g by mouth daily.     . hydrochlorothiazide (HYDRODIURIL) 25 MG tablet Take 25 mg by mouth daily.      Marland Kitchen KLOR-CON M20 20 MEQ tablet     . levothyroxine (SYNTHROID, LEVOTHROID) 125 MCG tablet Take 125 mcg by mouth daily.      . Liniments (SALONPAS EX) Apply 1 application topically once. For pain    . lisinopril (PRINIVIL,ZESTRIL) 5 MG tablet Take 5 mg by mouth daily.      Marland Kitchen loratadine (CLARITIN) 10 MG tablet Take 10 mg by mouth daily as needed. For allergies    . omeprazole (PRILOSEC) 40 MG capsule Take 1 capsule (40 mg total) by mouth daily. 30 capsule 1  . scopolamine (HYOSCINE) 0.25 % ophthalmic solution Place 1 drop into the right eye daily.     Marland Kitchen tobramycin (TOBREX) 0.3 % ophthalmic solution     . traMADol (ULTRAM) 50 MG tablet Take 1 tablet (50  mg total) by mouth every 8 (eight) hours as needed for pain. 12 tablet 0   No Known Allergies   Exam:  BP 143/74 mmHg  Pulse 85  Temp(Src) 98.3 F (36.8 C) (Oral)  Resp 16  SpO2 96% Gen: Well NAD HEENT: EOMI,  MMM Lungs: Normal work of breathing. CTABL Heart: RRR no MRG Abd: NABS, Soft. Nondistended, Nontender Exts: Brisk capillary refill, warm and well perfused.   No results found for this or any previous visit (from the past 24 hour(s)). No results found.  Assessment and Plan: 81 y.o. female with probable reflux. Treat with omeprazole. Watchful waiting follow-up with PCP.  Discussed warning signs or symptoms. Please see discharge instructions. Patient expresses understanding.     Gregor Hams, MD 01/29/15 1934  Gregor Hams, MD 01/29/15 1934

## 2015-01-29 NOTE — ED Notes (Signed)
Talked to patient at registration.  Reports right, epigastric pain for 2 weeks, intermittent.  Since Wednesday, episodes have been significantly worse.  When having episodes, frequent belching.  Currently no pain. Instructed patient and family know if any symptoms reoccurs

## 2015-01-29 NOTE — Discharge Instructions (Signed)
Thank you for coming in today. If your belly pain worsens, or you have high fever, bad vomiting, blood in your stool or black tarry stool go to the Emergency Room.  Call or go to the emergency room if you get worse, have trouble breathing, have chest pains, or palpitations.   Gastroesophageal Reflux Disease, Adult Gastroesophageal reflux disease (GERD) happens when acid from your stomach flows up into the esophagus. When acid comes in contact with the esophagus, the acid causes soreness (inflammation) in the esophagus. Over time, GERD may create small holes (ulcers) in the lining of the esophagus. CAUSES   Increased body weight. This puts pressure on the stomach, making acid rise from the stomach into the esophagus.  Smoking. This increases acid production in the stomach.  Drinking alcohol. This causes decreased pressure in the lower esophageal sphincter (valve or ring of muscle between the esophagus and stomach), allowing acid from the stomach into the esophagus.  Late evening meals and a full stomach. This increases pressure and acid production in the stomach.  A malformed lower esophageal sphincter. Sometimes, no cause is found. SYMPTOMS   Burning pain in the lower part of the mid-chest behind the breastbone and in the mid-stomach area. This may occur twice a week or more often.  Trouble swallowing.  Sore throat.  Dry cough.  Asthma-like symptoms including chest tightness, shortness of breath, or wheezing. DIAGNOSIS  Your caregiver may be able to diagnose GERD based on your symptoms. In some cases, X-rays and other tests may be done to check for complications or to check the condition of your stomach and esophagus. TREATMENT  Your caregiver may recommend over-the-counter or prescription medicines to help decrease acid production. Ask your caregiver before starting or adding any new medicines.  HOME CARE INSTRUCTIONS   Change the factors that you can control. Ask your caregiver  for guidance concerning weight loss, quitting smoking, and alcohol consumption.  Avoid foods and drinks that make your symptoms worse, such as:  Caffeine or alcoholic drinks.  Chocolate.  Peppermint or mint flavorings.  Garlic and onions.  Spicy foods.  Citrus fruits, such as oranges, lemons, or limes.  Tomato-based foods such as sauce, chili, salsa, and pizza.  Fried and fatty foods.  Avoid lying down for the 3 hours prior to your bedtime or prior to taking a nap.  Eat small, frequent meals instead of large meals.  Wear loose-fitting clothing. Do not wear anything tight around your waist that causes pressure on your stomach.  Raise the head of your bed 6 to 8 inches with wood blocks to help you sleep. Extra pillows will not help.  Only take over-the-counter or prescription medicines for pain, discomfort, or fever as directed by your caregiver.  Do not take aspirin, ibuprofen, or other nonsteroidal anti-inflammatory drugs (NSAIDs). SEEK IMMEDIATE MEDICAL CARE IF:   You have pain in your arms, neck, jaw, teeth, or back.  Your pain increases or changes in intensity or duration.  You develop nausea, vomiting, or sweating (diaphoresis).  You develop shortness of breath, or you faint.  Your vomit is green, yellow, black, or looks like coffee grounds or blood.  Your stool is red, bloody, or black. These symptoms could be signs of other problems, such as heart disease, gastric bleeding, or esophageal bleeding. MAKE SURE YOU:   Understand these instructions.  Will watch your condition.  Will get help right away if you are not doing well or get worse. Document Released: 05/16/2005 Document Revised: 10/29/2011 Document  Reviewed: 02/23/2011 Exit[employer] Patient Information 2015 [place], [place]. This information is not intended to replace advice given to you by your health care provider. Make sure you discuss any questions you have with your health care provider.

## 2015-04-28 ENCOUNTER — Ambulatory Visit: Payer: Medicare Other | Admitting: Podiatry

## 2015-05-23 ENCOUNTER — Ambulatory Visit (INDEPENDENT_AMBULATORY_CARE_PROVIDER_SITE_OTHER): Payer: Medicare Other | Admitting: Podiatry

## 2015-05-23 ENCOUNTER — Encounter: Payer: Self-pay | Admitting: Podiatry

## 2015-05-23 VITALS — BP 167/102 | HR 73 | Resp 16

## 2015-05-23 DIAGNOSIS — M79673 Pain in unspecified foot: Secondary | ICD-10-CM | POA: Diagnosis not present

## 2015-05-23 DIAGNOSIS — B351 Tinea unguium: Secondary | ICD-10-CM

## 2015-05-25 NOTE — Progress Notes (Signed)
Patient ID: MY RINKE, female   DOB: 20-Oct-1917, 81 y.o.o.   MRN: [id number]  Subjective: 81 y.o. returns the office today for painful, elongated, thickened toenails which she is unable to trim herself. Denies any redness or drainage around the nails. Denies any acute changes since last appointment and no new complaints today. Denies any systemic complaints such as fevers, chills, nausea, vomiting.   Objective: AAO 3, NAD DP/PT pulses palpable, CRT less than 3 seconds Nails hypertrophic, dystrophic, elongated, brittle, discolored 10. There is tenderness overlying the nails 1-5 bilaterally. There is no surrounding erythema or drainage along the nail sites. No open lesions or pre-ulcerative lesions are identified. No other areas of tenderness bilateral lower extremities. No overlying edema, erythema, increased warmth. No pain with calf compression, swelling, warmth, erythema.  Assessment: Patient presents with symptomatic onychomycosis  Plan: -Treatment options including alternatives, risks, complications were discussed -Nails sharply debrided 10 without complication/bleeding. -Discussed daily foot inspection. If there are any changes, to call the office immediately.  -Follow-up in 3 months or sooner if any problems are to arise. In the meantime, encouraged to call the office with any questions, concerns, changes symptoms.  Celesta Gentile, DPM

## 2015-08-30 ENCOUNTER — Ambulatory Visit: Payer: Medicare Other | Admitting: Podiatry

## 2015-10-19 ENCOUNTER — Encounter: Payer: Self-pay | Admitting: Podiatry

## 2015-10-19 ENCOUNTER — Ambulatory Visit (INDEPENDENT_AMBULATORY_CARE_PROVIDER_SITE_OTHER): Payer: Medicare Other | Admitting: Podiatry

## 2015-10-19 DIAGNOSIS — B351 Tinea unguium: Secondary | ICD-10-CM | POA: Diagnosis not present

## 2015-10-19 DIAGNOSIS — M79674 Pain in right toe(s): Secondary | ICD-10-CM | POA: Diagnosis not present

## 2015-10-19 DIAGNOSIS — M79675 Pain in left toe(s): Secondary | ICD-10-CM | POA: Diagnosis not present

## 2015-10-20 NOTE — Progress Notes (Signed)
Patient ID: Brandy Huff, female   DOB: Oct 12, 1917, 82 y.o.   MRN: [id number]   Subjective: This patient presents today with her daughter present in the treatment room. Her daughter is requesting debridement of her mother's toenails stating that they are elongated and uncomfortable when she wears closed shoes  Objective: Patient is delayed are nonresponsive to questioning No open skin lesions bilaterally The toenails are elongated, hypertrophic, discolored, deformed and tender direct palpation 6-10  Assessment: Symptomatic onychomycoses 6-10  Plan: Debridement of toenails 6-10 mechanically and electrically without any bleeding  Reappoint 3 months

## 2016-01-25 ENCOUNTER — Encounter: Payer: Self-pay | Admitting: Podiatry

## 2016-01-25 ENCOUNTER — Ambulatory Visit (INDEPENDENT_AMBULATORY_CARE_PROVIDER_SITE_OTHER): Payer: Medicare Other | Admitting: Podiatry

## 2016-01-25 DIAGNOSIS — M79675 Pain in left toe(s): Secondary | ICD-10-CM | POA: Diagnosis not present

## 2016-01-25 DIAGNOSIS — M79674 Pain in right toe(s): Secondary | ICD-10-CM

## 2016-01-25 DIAGNOSIS — B351 Tinea unguium: Secondary | ICD-10-CM | POA: Diagnosis not present

## 2016-01-25 NOTE — Progress Notes (Signed)
Patient ID: Brandy Huff, female   DOB: [birth year], 82 y.o.   MRN: [id number]  Subjective: This patient presents today with her daughter present in the treatment room. Her daughter is requesting debridement of her mother's toenails stating that they are elongated and uncomfortable when she wears closed shoes  Objective: Patient is delayed are nonresponsive to questioning No open skin lesions bilaterally The toenails are elongated, hypertrophic, discolored, deformed and tender direct palpation 6-10  Assessment: Symptomatic onychomycoses 6-10  Plan: Debridement of toenails 6-10 mechanically and electrically without any bleeding  Reappoint 3 months

## 2016-05-09 ENCOUNTER — Ambulatory Visit (INDEPENDENT_AMBULATORY_CARE_PROVIDER_SITE_OTHER): Payer: Medicare Other | Admitting: Podiatry

## 2016-05-09 ENCOUNTER — Encounter: Payer: Self-pay | Admitting: Podiatry

## 2016-05-09 VITALS — BP 160/92 | HR 87 | Resp 18

## 2016-05-09 DIAGNOSIS — M79675 Pain in left toe(s): Secondary | ICD-10-CM | POA: Diagnosis not present

## 2016-05-09 DIAGNOSIS — B351 Tinea unguium: Secondary | ICD-10-CM

## 2016-05-09 DIAGNOSIS — M79674 Pain in right toe(s): Secondary | ICD-10-CM | POA: Diagnosis not present

## 2016-05-10 NOTE — Progress Notes (Signed)
Patient ID: Brandy Huff, female   DOB: 1918-04-28, 82 y.o.   MRN: [id number]    Subjective: This patient presents today with her daughter present in the treatment room. Her daughter is requesting debridement of her mother's toenails stating that they are elongated and uncomfortable when she wears closed shoes  Objective: No open skin lesions bilaterally Pitting edema bilaterally DP and PT pulses 1/4 bilaterally Capillary reflex immediate bilaterally HAV deformities bilaterally The toenails are elongated, hypertrophic, discolored, deformed and tender direct palpation 6-10  Assessment: Symptomatic onychomycoses 6-10  Plan: Debridement of toenails 6-10 mechanically and electrically without any bleeding  Reappoint 3 months

## 2016-08-15 ENCOUNTER — Ambulatory Visit (INDEPENDENT_AMBULATORY_CARE_PROVIDER_SITE_OTHER): Payer: Medicare Other | Admitting: Podiatry

## 2016-08-15 ENCOUNTER — Encounter: Payer: Self-pay | Admitting: Podiatry

## 2016-08-15 VITALS — BP 145/85 | HR 84 | Resp 18

## 2016-08-15 DIAGNOSIS — B351 Tinea unguium: Secondary | ICD-10-CM | POA: Diagnosis not present

## 2016-08-15 DIAGNOSIS — M79674 Pain in right toe(s): Secondary | ICD-10-CM | POA: Diagnosis not present

## 2016-08-15 DIAGNOSIS — M79675 Pain in left toe(s): Secondary | ICD-10-CM | POA: Diagnosis not present

## 2016-08-15 NOTE — Progress Notes (Signed)
Patient ID: ADAJAH MAIETTA, female   DOB: February 03, 1918, 82 y.o.   MRN: [id number]    Subjective: This patient presents today with her daughter present in the treatment room. Her daughter is requesting debridement of her mother's toenails stating that they are elongated and uncomfortable when she wears closed shoes  Objective: Patient is responsive and appears orientated 3  Pitting edema bilaterally DP and PT pulses 1/4 bilaterally Capillary reflex immediate bilaterally Sensation to 10 g monofilament wire intact 5/5 bilaterally Vibratory sensation nonreactive bilaterally Ankle reflexes reactive bilaterally HAV deformities bilaterally Patient walks slowly with a walker Manual motor testing dorsi flexion, plantar flexion 5/5 bilaterally No open skin lesions bilaterally The toenails are elongated, hypertrophic, discolored, deformed and tender direct palpation 6-10  Assessment: Symptomatic onychomycoses 6-10  Plan: Debridement of toenails 6-10 mechanically and electrically without any bleeding  Reappoint 3 months

## 2016-09-09 ENCOUNTER — Ambulatory Visit (HOSPITAL_COMMUNITY)
Admission: EM | Admit: 2016-09-09 | Discharge: 2016-09-09 | Disposition: A | Discharge status: Home / Self Care | Payer: Medicare Other | Attending: Emergency Medicine | Admitting: Emergency Medicine

## 2016-09-09 ENCOUNTER — Encounter (HOSPITAL_COMMUNITY): Payer: Self-pay | Admitting: Emergency Medicine

## 2016-09-09 DIAGNOSIS — R4182 Altered mental status, unspecified: Secondary | ICD-10-CM | POA: Insufficient documentation

## 2016-09-09 DIAGNOSIS — Z7982 Long term (current) use of aspirin: Secondary | ICD-10-CM | POA: Diagnosis not present

## 2016-09-09 DIAGNOSIS — R441 Visual hallucinations: Secondary | ICD-10-CM | POA: Diagnosis not present

## 2016-09-09 DIAGNOSIS — Z79899 Other long term (current) drug therapy: Secondary | ICD-10-CM | POA: Diagnosis not present

## 2016-09-09 DIAGNOSIS — R300 Dysuria: Secondary | ICD-10-CM | POA: Diagnosis present

## 2016-09-09 DIAGNOSIS — E860 Dehydration: Secondary | ICD-10-CM | POA: Insufficient documentation

## 2016-09-09 DIAGNOSIS — N3000 Acute cystitis without hematuria: Secondary | ICD-10-CM | POA: Diagnosis not present

## 2016-09-09 LAB — POCT I-STAT, CHEM 8
BUN: 19 mg/dL (ref 6–20)
CHLORIDE: 96 mmol/L — AB (ref 101–111)
Calcium, Ion: 1.19 mmol/L (ref 1.15–1.40)
Creatinine, Ser: 0.9 mg/dL (ref 0.44–1.00)
GLUCOSE: 116 mg/dL — AB (ref 65–99)
HEMATOCRIT: 44 % (ref 36.0–46.0)
HEMOGLOBIN: 15 g/dL (ref 12.0–15.0)
POTASSIUM: 3.7 mmol/L (ref 3.5–5.1)
Sodium: 134 mmol/L — ABNORMAL LOW (ref 135–145)
TCO2: 24 mmol/L (ref 0–100)

## 2016-09-09 LAB — POCT URINALYSIS DIP (DEVICE)
BILIRUBIN URINE: NEGATIVE
GLUCOSE, UA: NEGATIVE mg/dL
HGB URINE DIPSTICK: NEGATIVE
KETONES UR: NEGATIVE mg/dL
Nitrite: NEGATIVE
Protein, ur: NEGATIVE mg/dL
Specific Gravity, Urine: 1.015 (ref 1.005–1.030)
UROBILINOGEN UA: 0.2 mg/dL (ref 0.0–1.0)
pH: 5 (ref 5.0–8.0)

## 2016-09-09 MED ORDER — CIPROFLOXACIN HCL 500 MG PO TABS: 500.0000 mg | ORAL_TABLET | Freq: Two times a day (BID) | ORAL | 0 refills | Status: DC

## 2016-09-09 NOTE — ED Triage Notes (Signed)
The patient presented to the [facility] with a complaint of dysuria and her family stated that she is having hallucinations.

## 2016-09-09 NOTE — ED Provider Notes (Signed)
CSN: [id number]     Arrival date & time 09/09/16  1531 History   First MD Initiated Contact with Patient 09/09/16 1802     Chief Complaint  Patient presents with  . Dysuria  . Altered Mental Status   (Consider location/radiation/quality/duration/timing/severity/associated sxs/prior Treatment) HPI Brandy Huff is a 83 y.o. female presenting to UC accompanied by family with reports of visual hallucinations that started today and a c/o dysuria.  Pt is blind per family but was stating she saw an insect on the wall that wasn't there. Family reports hx of similar symptoms a few months ago when she had a UTI.  Pt did not need to be hospitalized at that time but was placed on 1 week of Ciprofloxacin and symptoms resolved.  Pt notes she feels well. Denies abdominal pain, n/v/d. No fever or chills. She states she has been eating and drinking well. Family notes she has not eaten as much today as she normally does.  Family also notes she did not take a nap today like she usually does and wonders if that could be contributing to her hallucinations. Pt does live with her family who helps care for her.     Past Medical History:  Diagnosis Date  . Arthritis   . Atrial fibrillation ([place])   . Cancer ([place])    Breast  . Cancer ([place])    Colon  . Cancer (H[facility])    Skin  . Carotid artery occlusion   . Dermatophytosis of nail   . Fall from slipping Nov. 2013  . Hypertension   . PONV (postoperative nausea and vomiting)   . Thyroid disease    Past Surgical History:  Procedure Laterality Date  . ABDOMINAL HYSTERECTOMY    . APPENDECTOMY    . CAROTID ENDARTERECTOMY  2012   right  . CATARACT EXTRACTION     bilateral  . COLON SURGERY     Colectomy X 2  . ENDARTERECTOMY  02/08/2012   Procedure: ENDARTERECTOMY CAROTID;  Surgeon: Serafina Mitchell, MD;  Location: [facility] OR;  Service: Vascular;  Laterality: Left;  and Patch Angioplasty  . HERNIA REPAIR     Left Ventral hernia  . JOINT REPLACEMENT  2005   Bilateral knee replacements  . MASTECTOMY     Right Breast   Family History  Problem Relation Age of Onset  . Heart murmur Sister   . Heart disease Mother   . Cancer Daughter   . Hyperlipidemia Daughter   . Cancer Son   . Diabetes Son   . Hyperlipidemia Son   . Hypertension Son   . Heart attack Son   . Deep vein thrombosis Son    Social History  Substance Use Topics  . Smoking status: Never Smoker  . Smokeless tobacco: Current User    Types: Snuff  . Alcohol use No   OB History    No data available     Review of Systems  Constitutional: Negative for chills, fatigue and fever.  Gastrointestinal: Negative for abdominal pain, diarrhea, nausea and vomiting.  Genitourinary: Positive for dysuria. Negative for decreased urine volume, frequency, hematuria, pelvic pain and urgency.  Musculoskeletal: Negative for back pain and myalgias.  Psychiatric/Behavioral: Positive for hallucinations ( visual).    Allergies  Patient has no known allergies.  Home Medications   Prior to Admission medications   Medication Sig Start Date End Date Taking? Authorizing Provider  aspirin EC 325 MG tablet Take 325 mg by mouth daily.  Historical Provider, MD  atropine 1 % ophthalmic solution Place 1 drop into the right eye daily.    Historical Provider, MD  bacitracin ophthalmic ointment Place 1 application into the right eye daily. 02/26/12   Historical Provider, MD  Calcium Carbonate-Vitamin D (CALCIUM + D PO) Take 1 tablet by mouth daily.     Historical Provider, MD  ciprofloxacin (CIPRO) 500 MG tablet Take 1 tablet (500 mg total) by mouth 2 (two) times daily. One po bid x 5 days 09/09/16   Noland Fordyce, PA-C  digoxin (LANOXIN) 0.125 MG tablet Take 125 mcg by mouth daily.      Historical Provider, MD  dorzolamide-timolol (COSOPT) 22.3-6.8 MG/ML ophthalmic solution Place 1 drop into the right eye 2 (two) times daily.     Historical Provider, MD  fish oil-omega-3 fatty acids 1000 MG capsule Take  1 g by mouth daily.     Historical Provider, MD  hydrochlorothiazide (HYDRODIURIL) 25 MG tablet Take 25 mg by mouth daily.      Historical Provider, MD  KLOR-CON M20 20 MEQ tablet  08/09/12   Historical Provider, MD  levothyroxine (SYNTHROID, LEVOTHROID) 125 MCG tablet Take 125 mcg by mouth daily.      Historical Provider, MD  Liniments [facility] EX) Apply 1 application topically once. For pain    Historical Provider, MD  lisinopril (PRINIVIL,ZESTRIL) 5 MG tablet Take 5 mg by mouth daily.      Historical Provider, MD  loratadine (CLARITIN) 10 MG tablet Take 10 mg by mouth daily as needed. For allergies    Historical Provider, MD  omeprazole (PRILOSEC) 40 MG capsule Take 1 capsule (40 mg total) by mouth daily. 01/29/15   Gregor Hams, MD  scopolamine (HYOSCINE) 0.25 % ophthalmic solution Place 1 drop into the right eye daily.     Historical Provider, MD  tobramycin (TOBREX) 0.3 % ophthalmic solution  08/29/12   Historical Provider, MD  traMADol (ULTRAM) 50 MG tablet Take 1 tablet (50 mg total) by mouth every 8 (eight) hours as needed for pain. 07/06/12   Virgel Manifold, MD   Meds Ordered and Administered this Visit  Medications - No data to display  BP 155/87 (BP Location: Left Arm)   Pulse 83   Temp 97.9 F (36.6 C) (Oral)   Resp 16   SpO2 97%  No data found.   Physical Exam  Constitutional: She is oriented to person, place, and time. She appears well-developed and well-nourished. No distress.  Elderly female sitting in exam chair, NAD. Cooperative during exam. Non-toxic appearing.  HENT:  Head: Normocephalic and atraumatic.  Mouth/Throat: Oropharynx is clear and moist.  Eyes: EOM are normal.  Neck: Normal range of motion. Neck supple.  Cardiovascular: Normal rate and regular rhythm.   Pulmonary/Chest: Effort normal and breath sounds normal. No respiratory distress. She has no wheezes. She has no rales.  Abdominal: Soft. She exhibits no distension. There is no tenderness. There is no  CVA tenderness.  Musculoskeletal: Normal range of motion.  Neurological: She is alert and oriented to person, place, and time.  Speech is clear. Pt alert to person, place, and time.   Skin: Skin is warm and dry. She is not diaphoretic.  Psychiatric: She has a normal mood and affect. Her behavior is normal.  Nursing note and vitals reviewed.   Urgent Care Course     Procedures (including critical care time)  Labs Review Labs Reviewed  POCT URINALYSIS DIP (DEVICE) - Abnormal; Notable for the following:  Result Value   Leukocytes, UA SMALL (*)    All other components within normal limits  POCT I-STAT, CHEM 8 - Abnormal; Notable for the following:    Sodium 134 (*)    Chloride 96 (*)    Glucose, Bld 116 (*)    All other components within normal limits  URINE CULTURE    Imaging Review No results found.    MDM   1. Acute cystitis without hematuria   2. Hallucination, visual    Per family reports of visual hallucinations today c/w last time pt had a UTI a few months ago.  Pt appears alert and oriented. NAD. Cooperative during exam. Vitals: WNL   UA: small leukocytes, otherwise normal Will send culture but treat for UTI while labs pending due to current symptoms and reported hx from family.  istat chem-8: mild dehydration otherwise unremarkable.  Encouraged good hydration and eating.  Consulted with Dr. Bridgett Larsson, agreeable with plan to treat for UTI as culture pending. Family feels comfortable having pt discharged home. Discussed symptoms that warrant emergent care in the ED.    Noland Fordyce, PA-C 09/09/16 1946

## 2016-09-11 LAB — URINE CULTURE

## 2016-10-30 ENCOUNTER — Emergency Department (HOSPITAL_COMMUNITY): Payer: Medicare Other

## 2016-10-30 ENCOUNTER — Encounter (HOSPITAL_COMMUNITY): Payer: Self-pay | Admitting: Emergency Medicine

## 2016-10-30 ENCOUNTER — Inpatient Hospital Stay (HOSPITAL_COMMUNITY)
Admission: EM | Admit: 2016-10-30 | Discharge: 2016-11-05 | DRG: 194 | Disposition: A | Discharge status: Home Health | Payer: Medicare Other | Attending: Internal Medicine | Admitting: Internal Medicine

## 2016-10-30 DIAGNOSIS — Z853 Personal history of malignant neoplasm of breast: Secondary | ICD-10-CM | POA: Diagnosis not present

## 2016-10-30 DIAGNOSIS — F17290 Nicotine dependence, other tobacco product, uncomplicated: Secondary | ICD-10-CM | POA: Diagnosis present

## 2016-10-30 DIAGNOSIS — Z85038 Personal history of other malignant neoplasm of large intestine: Secondary | ICD-10-CM | POA: Diagnosis not present

## 2016-10-30 DIAGNOSIS — E039 Hypothyroidism, unspecified: Secondary | ICD-10-CM

## 2016-10-30 DIAGNOSIS — Z8249 Family history of ischemic heart disease and other diseases of the circulatory system: Secondary | ICD-10-CM

## 2016-10-30 DIAGNOSIS — Z833 Family history of diabetes mellitus: Secondary | ICD-10-CM | POA: Diagnosis not present

## 2016-10-30 DIAGNOSIS — Z9049 Acquired absence of other specified parts of digestive tract: Secondary | ICD-10-CM

## 2016-10-30 DIAGNOSIS — J189 Pneumonia, unspecified organism: Secondary | ICD-10-CM | POA: Diagnosis not present

## 2016-10-30 DIAGNOSIS — R531 Weakness: Secondary | ICD-10-CM

## 2016-10-30 DIAGNOSIS — E869 Volume depletion, unspecified: Secondary | ICD-10-CM | POA: Diagnosis not present

## 2016-10-30 DIAGNOSIS — Z90710 Acquired absence of both cervix and uterus: Secondary | ICD-10-CM | POA: Diagnosis not present

## 2016-10-30 DIAGNOSIS — Z7982 Long term (current) use of aspirin: Secondary | ICD-10-CM | POA: Diagnosis not present

## 2016-10-30 DIAGNOSIS — J181 Lobar pneumonia, unspecified organism: Secondary | ICD-10-CM

## 2016-10-30 DIAGNOSIS — Z96653 Presence of artificial knee joint, bilateral: Secondary | ICD-10-CM | POA: Diagnosis present

## 2016-10-30 DIAGNOSIS — Z79899 Other long term (current) drug therapy: Secondary | ICD-10-CM

## 2016-10-30 DIAGNOSIS — I10 Essential (primary) hypertension: Secondary | ICD-10-CM | POA: Diagnosis not present

## 2016-10-30 DIAGNOSIS — H547 Unspecified visual loss: Secondary | ICD-10-CM | POA: Diagnosis present

## 2016-10-30 DIAGNOSIS — E860 Dehydration: Secondary | ICD-10-CM | POA: Diagnosis present

## 2016-10-30 DIAGNOSIS — E871 Hypo-osmolality and hyponatremia: Secondary | ICD-10-CM | POA: Diagnosis present

## 2016-10-30 DIAGNOSIS — Z9011 Acquired absence of right breast and nipple: Secondary | ICD-10-CM | POA: Diagnosis not present

## 2016-10-30 DIAGNOSIS — R05 Cough: Secondary | ICD-10-CM | POA: Diagnosis present

## 2016-10-30 LAB — URINALYSIS, ROUTINE W REFLEX MICROSCOPIC
Bilirubin Urine: NEGATIVE
Glucose, UA: NEGATIVE mg/dL
KETONES UR: NEGATIVE mg/dL
Nitrite: POSITIVE — AB
PH: 5 (ref 5.0–8.0)
PROTEIN: NEGATIVE mg/dL
Specific Gravity, Urine: 1.017 (ref 1.005–1.030)

## 2016-10-30 LAB — CBC WITH DIFFERENTIAL/PLATELET
Basophils Absolute: 0 10*3/uL (ref 0.0–0.1)
Basophils Relative: 0 %
EOS ABS: 0.1 10*3/uL (ref 0.0–0.7)
EOS PCT: 1 %
HCT: 39.4 % (ref 36.0–46.0)
HEMOGLOBIN: 13.5 g/dL (ref 12.0–15.0)
LYMPHS ABS: 1.4 10*3/uL (ref 0.7–4.0)
LYMPHS PCT: 13 %
MCH: 31.8 pg (ref 26.0–34.0)
MCHC: 34.3 g/dL (ref 30.0–36.0)
MCV: 92.7 fL (ref 78.0–100.0)
MONOS PCT: 11 %
Monocytes Absolute: 1.2 10*3/uL — ABNORMAL HIGH (ref 0.1–1.0)
NEUTROS PCT: 75 %
Neutro Abs: 8.1 10*3/uL — ABNORMAL HIGH (ref 1.7–7.7)
Platelets: 227 10*3/uL (ref 150–400)
RBC: 4.25 MIL/uL (ref 3.87–5.11)
RDW: 13.5 % (ref 11.5–15.5)
WBC: 10.9 10*3/uL — ABNORMAL HIGH (ref 4.0–10.5)

## 2016-10-30 LAB — COMPREHENSIVE METABOLIC PANEL
ALBUMIN: 2.9 g/dL — AB (ref 3.5–5.0)
ALT: 19 U/L (ref 14–54)
AST: 30 U/L (ref 15–41)
Alkaline Phosphatase: 80 U/L (ref 38–126)
Anion gap: 12 (ref 5–15)
BUN: 16 mg/dL (ref 6–20)
CHLORIDE: 92 mmol/L — AB (ref 101–111)
CO2: 24 mmol/L (ref 22–32)
CREATININE: 0.8 mg/dL (ref 0.44–1.00)
Calcium: 9.1 mg/dL (ref 8.9–10.3)
GFR calc Af Amer: >60 mL/min (ref >60)
GFR calc non Af Amer: 60 mL/min — ABNORMAL LOW (ref >60)
Glucose, Bld: 139 mg/dL — ABNORMAL HIGH (ref 65–99)
Potassium: 3.3 mmol/L — ABNORMAL LOW (ref 3.5–5.1)
SODIUM: 128 mmol/L — AB (ref 135–145)
Total Bilirubin: 1.1 mg/dL (ref 0.3–1.2)
Total Protein: 6.7 g/dL (ref 6.5–8.1)

## 2016-10-30 LAB — I-STAT CG4 LACTIC ACID, ED
LACTIC ACID, VENOUS: 1.35 mmol/L (ref 0.5–1.9)
Lactic Acid, Venous: 2.45 mmol/L (ref 0.5–1.9)

## 2016-10-30 MED ORDER — DEXTROSE 5 % IV SOLN
1.0000 g | INTRAVENOUS | Status: DC
  Administered 2016-10-31 – 2016-11-01 (×2): 1 g via INTRAVENOUS
  Filled 2016-10-30 (×3): qty 10

## 2016-10-30 MED ORDER — METOPROLOL SUCCINATE ER 25 MG PO TB24
25.0000 mg | ORAL_TABLET | Freq: Every day | ORAL | Status: DC
  Administered 2016-10-31 – 2016-11-05 (×6): 25 mg via ORAL
  Filled 2016-10-30 (×6): qty 1

## 2016-10-30 MED ORDER — TIMOLOL MALEATE 0.5 % OP SOLN
1.0000 [drp] | Freq: Two times a day (BID) | OPHTHALMIC | Status: DC
  Administered 2016-10-31 – 2016-11-05 (×12): 1 [drp] via OPHTHALMIC
  Filled 2016-10-30 (×2): qty 5

## 2016-10-30 MED ORDER — DORZOLAMIDE HCL-TIMOLOL MAL 2-0.5 % OP SOLN
1.0000 [drp] | Freq: Two times a day (BID) | OPHTHALMIC | Status: DC
  Filled 2016-10-30: qty 10

## 2016-10-30 MED ORDER — SODIUM CHLORIDE 0.9 % IV BOLUS (SEPSIS)
500.0000 mL | Freq: Once | INTRAVENOUS | Status: AC
  Administered 2016-10-30: 500 mL via INTRAVENOUS

## 2016-10-30 MED ORDER — ENOXAPARIN SODIUM 40 MG/0.4ML ~~LOC~~ SOLN
40.0000 mg | SUBCUTANEOUS | Status: DC
  Administered 2016-10-30 – 2016-11-04 (×6): 40 mg via SUBCUTANEOUS
  Filled 2016-10-30 (×6): qty 0.4

## 2016-10-30 MED ORDER — ALBUTEROL SULFATE (2.5 MG/3ML) 0.083% IN NEBU: 2.5000 mg | INHALATION_SOLUTION | RESPIRATORY_TRACT | Status: DC | PRN

## 2016-10-30 MED ORDER — AZITHROMYCIN 500 MG IV SOLR
500.0000 mg | Freq: Once | INTRAVENOUS | Status: AC
  Administered 2016-10-30: 500 mg via INTRAVENOUS
  Filled 2016-10-30: qty 500

## 2016-10-30 MED ORDER — DEXTROSE 5 % IV SOLN
500.0000 mg | INTRAVENOUS | Status: DC
  Administered 2016-10-31: 500 mg via INTRAVENOUS
  Filled 2016-10-30: qty 500

## 2016-10-30 MED ORDER — DEXTROSE 5 % IV SOLN
1.0000 g | Freq: Once | INTRAVENOUS | Status: AC
  Administered 2016-10-30: 1 g via INTRAVENOUS
  Filled 2016-10-30: qty 10

## 2016-10-30 MED ORDER — SODIUM CHLORIDE 0.9 % IV SOLN
INTRAVENOUS | Status: DC
  Administered 2016-10-31 (×3): via INTRAVENOUS

## 2016-10-30 MED ORDER — GUAIFENESIN 100 MG/5ML PO SOLN
5.0000 mL | ORAL | Status: DC | PRN
  Administered 2016-10-31 – 2016-11-04 (×8): 100 mg via ORAL
  Filled 2016-10-30 (×8): qty 5

## 2016-10-30 MED ORDER — POTASSIUM CHLORIDE CRYS ER 20 MEQ PO TBCR
20.0000 meq | EXTENDED_RELEASE_TABLET | Freq: Every day | ORAL | Status: DC
  Administered 2016-10-31: 20 meq via ORAL
  Filled 2016-10-30: qty 1

## 2016-10-30 MED ORDER — LEVOTHYROXINE SODIUM 112 MCG PO TABS
112.0000 ug | ORAL_TABLET | Freq: Every day | ORAL | Status: DC
  Administered 2016-10-31 – 2016-11-05 (×6): 112 ug via ORAL
  Filled 2016-10-30 (×6): qty 1

## 2016-10-30 MED ORDER — DORZOLAMIDE HCL 2 % OP SOLN
1.0000 [drp] | Freq: Two times a day (BID) | OPHTHALMIC | Status: DC
  Administered 2016-10-31 – 2016-11-05 (×12): 1 [drp] via OPHTHALMIC
  Filled 2016-10-30 (×2): qty 10

## 2016-10-30 MED ORDER — LORATADINE 10 MG PO TABS
10.0000 mg | ORAL_TABLET | Freq: Every day | ORAL | Status: DC
  Administered 2016-10-31 – 2016-11-05 (×6): 10 mg via ORAL
  Filled 2016-10-30 (×6): qty 1

## 2016-10-30 MED ORDER — ASPIRIN EC 325 MG PO TBEC
325.0000 mg | DELAYED_RELEASE_TABLET | Freq: Every day | ORAL | Status: DC
  Administered 2016-10-31 – 2016-11-05 (×6): 325 mg via ORAL
  Filled 2016-10-30 (×6): qty 1

## 2016-10-30 NOTE — ED Provider Notes (Signed)
[facility] DEPT Provider Note   CSN: [id number] Arrival date & time: 10/30/16  1626     History   Chief Complaint Chief Complaint  Patient presents with  . Cough  . Fatigue    HPI Brandy Huff is a 83 y.o. female.  HPI Patient presents with 2 family members who provide history of present illness. Patient was actually at her 8 office today, after developing cough, weakness, fatigue, hypersomnolence about 4 days ago. Since onset symptoms of been persistent, with no clear alleviating or exacerbating factors.  Patient lives at home, is generally well, and prior to this change in mental status, was interactive, awake, ambulatory, feeds herself. Patient herself also is blind, has poor hearing, which somewhat limits history of present illness, but family members are of great assistance. Level V Caveat  Past Medical History:  Diagnosis Date  . Arthritis   . Atrial fibrillation ([place])   . Cancer ([place])    Breast  . Cancer ([place])    Colon  . Cancer (H[facility])    Skin  . Carotid artery occlusion   . Dermatophytosis of nail   . Fall from slipping Nov. 2013  . Hypertension   . PONV (postoperative nausea and vomiting)   . Thyroid disease     Patient Active Problem List   Diagnosis Date Noted  . Dermatophytosis of nail 05/11/2013  . Aftercare following surgery of the circulatory system, [place] 09/29/2012  . Occlusion and stenosis of carotid artery without mention of cerebral infarction 03/17/2012    Past Surgical History:  Procedure Laterality Date  . ABDOMINAL HYSTERECTOMY    . APPENDECTOMY    . CAROTID ENDARTERECTOMY  2012   right  . CATARACT EXTRACTION     bilateral  . COLON SURGERY     Colectomy X 2  . ENDARTERECTOMY  02/08/2012   Procedure: ENDARTERECTOMY CAROTID;  Surgeon: Serafina Mitchell, MD;  Location: [facility] OR;  Service: Vascular;  Laterality: Left;  and Patch Angioplasty  . HERNIA REPAIR     Left Ventral hernia  . JOINT REPLACEMENT  2005   Bilateral  knee replacements  . MASTECTOMY     Right Breast    OB History    No data available       Home Medications    Prior to Admission medications   Medication Sig Start Date End Date Taking? Authorizing Provider  aspirin EC 325 MG tablet Take 325 mg by mouth daily.    Yes Historical Provider, MD  atropine 1 % ophthalmic solution Place 1 drop into the right eye daily.   Yes Historical Provider, MD  KLOR-CON M20 20 MEQ tablet  08/09/12  Yes Historical Provider, MD  metoprolol succinate (TOPROL-XL) 25 MG 24 hr tablet Take 25 mg by mouth daily.   Yes Historical Provider, MD  metroNIDAZOLE (METROGEL) 1 % gel Apply 1 application topically daily.   Yes Historical Provider, MD  digoxin (LANOXIN) 0.125 MG tablet Take 125 mcg by mouth daily.      Historical Provider, MD  dorzolamide-timolol (COSOPT) 22.3-6.8 MG/ML ophthalmic solution Place 1 drop into the right eye 2 (two) times daily.     Historical Provider, MD  fish oil-omega-3 fatty acids 1000 MG capsule Take 1 g by mouth daily.     Historical Provider, MD  hydrochlorothiazide (HYDRODIURIL) 25 MG tablet Take 25 mg by mouth daily.      Historical Provider, MD  levothyroxine (SYNTHROID, LEVOTHROID) 125 MCG tablet Take 125 mcg by mouth daily.  Historical Provider, MD  Liniments [facility] EX) Apply 1 application topically once. For pain    Historical Provider, MD  lisinopril (PRINIVIL,ZESTRIL) 5 MG tablet Take 5 mg by mouth daily.      Historical Provider, MD  loratadine (CLARITIN) 10 MG tablet Take 10 mg by mouth daily as needed. For allergies    Historical Provider, MD  omeprazole (PRILOSEC) 40 MG capsule Take 1 capsule (40 mg total) by mouth daily. 01/29/15   Gregor Hams, MD  scopolamine (HYOSCINE) 0.25 % ophthalmic solution Place 1 drop into the right eye daily.     Historical Provider, MD  tobramycin (TOBREX) 0.3 % ophthalmic solution  08/29/12   Historical Provider, MD  traMADol (ULTRAM) 50 MG tablet Take 1 tablet (50 mg total) by mouth  every 8 (eight) hours as needed for pain. 07/06/12   Virgel Manifold, MD    Family History Family History  Problem Relation Age of Onset  . Heart murmur Sister   . Heart disease Mother   . Cancer Daughter   . Hyperlipidemia Daughter   . Cancer Son   . Diabetes Son   . Hyperlipidemia Son   . Hypertension Son   . Heart attack Son   . Deep vein thrombosis Son     Social History Social History  Substance Use Topics  . Smoking status: Never Smoker  . Smokeless tobacco: Current User    Types: Snuff  . Alcohol use No     Allergies   Patient has no known allergies.   Review of Systems Review of Systems  Constitutional:       Per HPI, otherwise negative  HENT:       Per HPI, otherwise negative  Respiratory:       Per HPI, otherwise negative  Cardiovascular:       Per HPI, otherwise negative  Gastrointestinal: Negative for vomiting.  Endocrine:       Negative aside from HPI  Genitourinary:       Neg aside from HPI   Musculoskeletal:       Per HPI, otherwise negative  Skin: Negative.   Allergic/Immunologic: Negative for immunocompromised state.  Neurological: Positive for weakness. Negative for syncope.   review of system conducted with the family members, some limitation secondary to secondary reporting, but the patient seems to agree to details.   Physical Exam Updated Vital Signs BP 114/59   Pulse 100   Temp 98 F (36.7 C) (Oral)   Resp 20   SpO2 97%   Physical Exam  Constitutional:  Sickly elderly female minimally interactive  HENT:  Head: Normocephalic and atraumatic.  Eyes: Right eye exhibits no discharge. Left eye exhibits no discharge.  Cardiovascular: Normal rate and regular rhythm.   Murmur heard. Pulmonary/Chest: No stridor. She has decreased breath sounds.  Abdominal: She exhibits no distension.  Musculoskeletal: She exhibits no edema.  Neurological: She is alert. No cranial nerve deficit.  Blind, poor hearing  Skin: Skin is warm and dry.    Psychiatric: Her speech is delayed. She is slowed and withdrawn. Cognition and memory are impaired.  Nursing note and vitals reviewed.    ED Treatments / Results  Labs (all labs ordered are listed, but only abnormal results are displayed) Labs Reviewed  COMPREHENSIVE METABOLIC PANEL - Abnormal; Notable for the following:       Result Value   Sodium 128 (*)    Potassium 3.3 (*)    Chloride 92 (*)    Glucose, Bld 139 (*)  Albumin 2.9 (*)    GFR calc non Af Amer 60 (*)    All other components within normal limits  CBC WITH DIFFERENTIAL/PLATELET - Abnormal; Notable for the following:    WBC 10.9 (*)    Neutro Abs 8.1 (*)    Monocytes Absolute 1.2 (*)    All other components within normal limits  I-STAT CG4 LACTIC ACID, ED - Abnormal; Notable for the following:    Lactic Acid, Venous 2.45 (*)    All other components within normal limits  URINALYSIS, ROUTINE W REFLEX MICROSCOPIC     Radiology Dg Chest 2 View  Result Date: 10/30/2016 CLINICAL DATA:  83 y/o  F; productive cough, fever, and ileus. EXAM: CHEST  2 VIEW COMPARISON:  09/07/2011 chest radiograph. FINDINGS: Right middle and right greater than left lower lobe consolidations. Small right and trace left pleural effusions. Stable cardiac silhouette given projection and technique. Aortic atherosclerosis with calcification. 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 Right maxillary and left upper quadrant surgical clips. IMPRESSION: Right middle and right greater than left lower lobe pneumonia with small effusions. These results will be called to the ordering clinician or representative by the Radiologist Assistant, and communication documented in the PACS or zVision Dashboard. Electronically Signed   By: Kristine Garbe M.D.   On: 10/30/2016 18:31  I discussed the patient's x-ray with radiologist. With concern for multi lobar pneumonia decreased interactivity, change in mental status, she will require  admission.   Procedures Procedures (including critical care time)  Medications Ordered in ED Medications  cefTRIAXone (ROCEPHIN) 1 g in dextrose 5 % 50 mL IVPB (not administered)  azithromycin (ZITHROMAX) 500 mg in dextrose 5 % 250 mL IVPB (not administered)     Initial Impression / Assessment and Plan / ED Course  I have reviewed the triage vital signs and the nursing notes.  Pertinent labs & imaging results that were available during my care of the patient were reviewed by me and considered in my medical decision making (see chart for details).  Final Clinical Impressions(s) / ED Diagnoses  Community-acquired pneumonia   Carmin Muskrat, MD 10/30/16 1927

## 2016-10-30 NOTE — H&P (Signed)
[facility] History and Physical  Brandy Huff JYN:[id number] DOB: 11/07/1917 DOA: 10/30/2016  Referring physician: Dr Vanita Panda PCP: [name], MD   Chief Complaint: Cough , generalized weakness  HPI: Brandy Huff is a 83 y.o. 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 Started coughing on Sat/ Sun this past weekend, has gotten weaker and now cannot get up and walk with a walker which is her usual baseline.  Subjective fever, no chills, no CP, no abd pain, n/v/d.  No hx PNA, nonsmoker.  In ED, heart rate 102, RR 20, BP's normal.  CXR shows new bibasilar infiltrates and RML infiltrates.  Asked to see for admission.    Daughter provides history.  Pt has remote hist R breast ca, sp mastectomy w/o recur.  Hx colon cancer 25 yrs ago, did partial colon removal, then it came back 5 yrs later and they took out the rest of the colon. Per dtr the ileum is attached to the rectum now.  She lived alone but family (3 grown children) stay with her now most of the time since she lost her vision.  Lives in [place].  Pt grew up in 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  Is now widowed.  No tob/ etoh.        ROS  denies CP  no joint pain   no HA  no blurry vision  no rash  no diarrhea  no nausea/ vomiting  no dysuria  no difficulty voiding  no change in urine color    Past Medical History  Past Medical History:  Diagnosis Date  . Arthritis   . Atrial fibrillation ([place])   . Cancer ([place])    Breast  . Cancer ([place])    Colon  . Cancer ([facility])    Skin  . Carotid artery occlusion   . Dermatophytosis of nail   . Fall from slipping Nov. 2013  . Hypertension   . PONV (postoperative nausea and vomiting)   . Thyroid disease    Past Surgical History  Past Surgical History:  Procedure Laterality Date  . ABDOMINAL HYSTERECTOMY     . APPENDECTOMY    . CAROTID ENDARTERECTOMY  2012   right  . CATARACT EXTRACTION     bilateral  . COLON SURGERY     Colectomy X 2  . ENDARTERECTOMY  02/08/2012   Procedure: ENDARTERECTOMY CAROTID;  Surgeon: Serafina Mitchell, MD;  Location: [facility] OR;  Service: Vascular;  Laterality: Left;  and Patch Angioplasty  . HERNIA REPAIR     Left Ventral hernia  . JOINT REPLACEMENT  2005   Bilateral knee replacements  . MASTECTOMY     Right Breast   Family History  Family History  Problem Relation Age of Onset  . Heart murmur Sister   . Heart disease Mother   . Cancer Daughter   . Hyperlipidemia Daughter   . Cancer Son   . Diabetes Son   . Hyperlipidemia Son   . Hypertension Son   . Heart attack Son   . Deep vein thrombosis Son    Social History  reports that she has never smoked. Her smokeless tobacco use includes Snuff. She reports that she does not drink alcohol or use drugs. Allergies No Known Allergies Home medications Prior to Admission medications   Medication Sig Start  Date End Date Taking? Authorizing Provider  aspirin EC 325 MG tablet Take 325 mg by mouth daily.    Yes Historical Provider, MD  dextromethorphan-guaiFENesin (MUCINEX DM) 30-600 MG 12hr tablet Take 1 tablet by mouth 2 (two) times daily.   Yes Historical Provider, MD  dorzolamide-timolol (COSOPT) 22.3-6.8 MG/ML ophthalmic solution Place 1 drop into the right eye 2 (two) times daily.    Yes Historical Provider, MD  hydrochlorothiazide (HYDRODIURIL) 25 MG tablet Take 25 mg by mouth daily.     Yes Historical Provider, MD  KLOR-CON M20 20 MEQ tablet Take 20 mEq by mouth daily.  08/09/12  Yes Historical Provider, MD  levothyroxine (SYNTHROID, LEVOTHROID) 112 MCG tablet Take 112 mcg by mouth daily before breakfast.   Yes Historical Provider, MD  lisinopril (PRINIVIL,ZESTRIL) 5 MG tablet Take 5 mg by mouth daily.     Yes Historical Provider, MD  loratadine (CLARITIN) 10 MG tablet Take 10 mg by mouth daily.   Yes Historical  Provider, MD  metoprolol succinate (TOPROL-XL) 25 MG 24 hr tablet Take 25 mg by mouth daily.   Yes Historical Provider, MD  metroNIDAZOLE (METROGEL) 1 % gel Apply 1 application topically daily.   Yes Historical Provider, MD   Liver Function Tests  Recent Labs Lab 10/30/16 1732  AST 30  ALT 19  ALKPHOS 80  BILITOT 1.1  PROT 6.7  ALBUMIN 2.9*   No results for input(s): LIPASE, AMYLASE in the last 168 hours. CBC  Recent Labs Lab 10/30/16 1732  WBC 10.9*  NEUTROABS 8.1*  HGB 13.5  HCT 39.4  MCV 92.7  PLT 161   Basic Metabolic Panel  Recent Labs Lab 10/30/16 1732  NA 128*  K 3.3*  CL 92*  CO2 24  GLUCOSE 139*  BUN 16  CREATININE 0.80  CALCIUM 9.1     Vitals:   10/30/16 1718  BP: 114/59  Pulse: 100  Resp: 20  Temp: 98 F (36.7 C)  TempSrc: Oral  SpO2: 97%   Exam: Gen elderly , somewhat frail WF, not in distress, coughing, tired No rash, cyanosis or gangrene Sclera anicteric, throat clear and slightly dry  No jvd or bruits, bilat scars both sides of neck Chest dec'd BS bilat bases, bronchial BS L base RRR no MRG Abd soft ntnd no mass or ascites +bs GU defer MS no joint effusions or deformity Ext no LE or UE edema / no wounds or ulcers Neuro is alert, Ox 3 , gen'd weakness  Na 128  K 3.3 BUN 16  Cr 0.8  Ca 9  Alb 2.9   LFT's wnl   WBC 10.9  Hb 13 plt 227   CXR (independ reviewed) > Right middle and right greater than left lower lobe pneumonia with small effusions.   Assessment: 1. Comm-acquired PNA - in elderly but functional 83 yo.  Plan admit, IVF"s, IV abx with rocephin / azithromycin. Nasal O2, nebs prn.   2. HTN - hold diuretic and lisinopril for now, BP's on the low side.  Cont toprol-XL.   3. Hypothyroidism 4. History of total colectomy - after colon Ca x 2 5. Gen'd weakness - due to #1, old age 52. Vol depletion  Plan - as above. D/w family and questions answered. Full code for now per dtr.      Roney Jaffe D [facility] Pager [phone]   If 7PM-7AM, please contact night-coverage [email or web address] Password [facility] 10/30/2016, 7:53 PM

## 2016-10-30 NOTE — ED Notes (Signed)
Received call from Dr. Ralene Cork office about this patient.  States patient is lethargic, decreased appetite, difficulty ambulating, hypotension and was sent here for r/o pneumonia.  91% on RA.

## 2016-10-30 NOTE — ED Triage Notes (Signed)
pts family reports pt has had productive cough, fever, and malaise since Friday. Family also reports pt has not been eating and drinking as much as normal. Pt a/o, sent her by PCP for further eval to rule out pneumonia.

## 2016-10-30 NOTE — ED Notes (Signed)
Primary RN made aware.  Attempted MD.  Primary RN to follow up.

## 2016-10-31 ENCOUNTER — Ambulatory Visit: Payer: Medicare Other | Admitting: Podiatry

## 2016-10-31 LAB — CBC
HCT: 37.5 % (ref 36.0–46.0)
HEMOGLOBIN: 12.8 g/dL (ref 12.0–15.0)
MCH: 31.4 pg (ref 26.0–34.0)
MCHC: 34.1 g/dL (ref 30.0–36.0)
MCV: 92.1 fL (ref 78.0–100.0)
Platelets: 209 10*3/uL (ref 150–400)
RBC: 4.07 MIL/uL (ref 3.87–5.11)
RDW: 13.2 % (ref 11.5–15.5)
WBC: 9.7 10*3/uL (ref 4.0–10.5)

## 2016-10-31 LAB — BASIC METABOLIC PANEL
ANION GAP: 12 (ref 5–15)
BUN: 10 mg/dL (ref 6–20)
CHLORIDE: 89 mmol/L — AB (ref 101–111)
CO2: 26 mmol/L (ref 22–32)
Calcium: 8.6 mg/dL — ABNORMAL LOW (ref 8.9–10.3)
Creatinine, Ser: 0.7 mg/dL (ref 0.44–1.00)
GFR calc non Af Amer: >60 mL/min (ref >60)
Glucose, Bld: 116 mg/dL — ABNORMAL HIGH (ref 65–99)
Potassium: 3.2 mmol/L — ABNORMAL LOW (ref 3.5–5.1)
SODIUM: 127 mmol/L — AB (ref 135–145)

## 2016-10-31 LAB — STREP PNEUMONIAE URINARY ANTIGEN: Strep Pneumo Urinary Antigen: NEGATIVE

## 2016-10-31 NOTE — Progress Notes (Signed)
Received patient from ED accompanied by family members.  Patient [name], VS stable, skin intact, denies pain, and O2Sat at 94% on RA.  Patient and family members oriented to room, bed controls and call light. Patient now resting on bed comfortably with both eyes closed.  Daughter at bedside.n  Will monitor.

## 2016-10-31 NOTE — Evaluation (Signed)
Physical Therapy Evaluation Patient Details Name: Brandy Huff MRN: [id number] DOB: November 11, 1917 Today's Date: 10/31/2016   History of Present Illness  Brandy Huff is a 83 y.o. 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 Started coughing on Sat/ Sun this past weekend, has gotten weaker and now cannot get up and walk with a walker which is her usual baseline.   Clinical Impression  Pt admitted with above diagnosis. Pt currently with functional limitations due to the deficits listed below (see PT Problem List). Pt able to stand and pivot to 3N1 and then to recliner. Weak and son states she is weaker than PTA but he feels they can take her home with some HH therapy. Will progress as able.   Pt will benefit from skilled PT to increase their independence and safety with mobility to allow discharge to the venue listed below.      Follow Up Recommendations Home health PT;Supervision/Assistance - 24 hour (HHOT)    Equipment Recommendations  None recommended by PT    Recommendations for Other Services       Precautions / Restrictions Precautions Precautions: Fall Restrictions Weight Bearing Restrictions: No      Mobility  Bed Mobility Overal bed mobility: Needs Assistance Bed Mobility: Supine to Sit     Supine to sit: Mod assist     General bed mobility comments: assist for elevation of trunk and for LES  Transfers Overall transfer level: Needs assistance Equipment used: Rolling walker (2 wheeled) Transfers: Sit to/from [facility] Sit to Stand: Min guard;Min assist Stand pivot transfers: Min assist       General transfer comment: Pt needed assist to power up and assist to sequence steps and RW to pivot to 3N1 and then to recliner once done on 3N1.  Pt needed steadying asisst to stand with RW while being cleaned with mild  posterior lean.   Ambulation/Gait                Stairs            Wheelchair Mobility    Modified Rankin (Stroke Patients Only)       Balance Overall balance assessment: Needs assistance;History of Falls Sitting-balance support: No upper extremity supported;Feet supported Sitting balance-Leahy Scale: Fair     Standing balance support: Bilateral upper extremity supported;During functional activity Standing balance-Leahy Scale: Poor Standing balance comment: relied on bil UEs for support.                              Pertinent Vitals/Pain Pain Assessment: No/denies pain  VSS  Home Living Family/patient expects to be discharged to:: Private residence Living Arrangements: Children;Other relatives Available Help at Discharge: Family;Available 24 hours/day Type of Home: House Home Access: Stairs to enter Entrance Stairs-Rails: None Entrance Stairs-Number of Steps: 1 Home Layout: One level Home Equipment: Shower seat - built in;Walker - 2 wheels;Bedside commode Additional Comments: son has a shower chair he thinks    Prior Function Level of Independence: Needs assistance   Gait / Transfers Assistance Needed: ambulated with RW with assist prn as pt is blind  ADL's / Homemaking Assistance Needed: assist prn        Hand Dominance        Extremity/Trunk Assessment   Upper Extremity Assessment Upper Extremity Assessment: Defer  to OT evaluation    Lower Extremity Assessment Lower Extremity Assessment: Generalized weakness    Cervical / Trunk Assessment Cervical / Trunk Assessment: Kyphotic  Communication   Communication: No difficulties  Cognition Arousal/Alertness: Awake/alert Behavior During Therapy: Flat affect Overall Cognitive Status: Within Functional Limits for tasks assessed                 General Comments: possibly a little slow to process, unsure if due to fatigue    General Comments      Exercises      Assessment/Plan    PT Assessment Patient needs continued PT services  PT Problem List Decreased balance;Decreased activity tolerance;Decreased strength;Decreased mobility;Decreased knowledge of use of DME;Decreased safety awareness       PT Treatment Interventions DME instruction;Gait training;Functional mobility training;Therapeutic activities;Therapeutic exercise;Balance training;Patient/family education;Stair training    PT Goals (Current goals can be found in the Care Plan section)  Acute Rehab PT Goals Patient Stated Goal: to go home PT Goal Formulation: With patient Time For Goal Achievement: 11/14/16 Potential to Achieve Goals: Good    Frequency Min 3X/week   Barriers to discharge        Co-evaluation               End of Session Equipment Utilized During Treatment: Gait belt Activity Tolerance: Patient limited by fatigue Patient left: in chair;with call bell/phone within reach;with chair alarm set Nurse Communication: Mobility status PT Visit Diagnosis: Unsteadiness on feet (R26.81);Muscle weakness (generalized) (M62.81)         Time: 7124-5809 PT Time Calculation (min) (ACUTE ONLY): 28 min   Charges:   PT Evaluation $PT Eval Moderate Complexity: 1 Procedure PT Treatments $Therapeutic Activity: 8-22 mins   PT G Codes:         Brandy Huff 11-12-16, 2:45 PM [facility] Acute Rehabilitation [phone] (pager)

## 2016-10-31 NOTE — Care Management Note (Signed)
Case Management Note  Patient Details  Name: EXA BOMBA MRN: [id number] Date of Birth: [birth year]  Subjective/Objective:                    Action/Plan: Discussed discharge planning with patient and daughter at bedside. PO/OT evals pending today. If recommendation is home health they would like [place]. Patient already has wheel chair and bedside commode at home.   At discharge family will transport patient home in car.  Confirmed PCP is Dr Lysle Rubens  Expected Discharge Date:                  Expected Discharge Plan:  [place]  In-House Referral:     Discharge planning Services  CM Consult  Post Acute Care Choice:    Choice offered to:  Patient, Adult Children  DME Arranged:    DME Agency:     HH Arranged:    [place]:  [place]  Status of Service:  In process, will continue to follow  If discussed at Long Length of Stay Meetings, dates discussed:    Additional Comments:  Marilu Favre, RN 10/31/2016, 10:28 AM

## 2016-10-31 NOTE — Progress Notes (Signed)
PROGRESS NOTE    FINDLEY BLANKENBAKER  KNL:[id number] DOB: [birth year] DOA: 10/30/2016 PCP: [name], MD    Brief Narrative:  83 y.o. 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 Started coughing on Sat/ Sun this past weekend, has gotten weaker and now cannot get up and walk with a walker which is her usual baseline.  Subjective fever, no chills, no CP, no abd pain, n/v/d.  No hx PNA, nonsmoker.  In ED, heart rate 102, RR 20, BP's normal.  CXR shows new bibasilar infiltrates and RML infiltrates.  Asked to see for admission.    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 CAP 1. Patient continued on azithromycin and rocephin 2. No leukocytosis 3. Afebrile 4. RML and R>LLL PNA with effusions seen on CXR, reviewed 5. Continue supportive care 2. HTN 1. BP stable 2. Currently controlled 3. Hypothyroid 1. Currently stable 2. Patient is continued on levothyroxine 4. Hx colectomy 1. Seems stable at this time 5. Generalized weakness 1. PT/OT consulted 2. Suspect secondary to dehydration 6. Dehydration 1. Clinically dehydrated 2. Dry mucus membranes, decreased skin turgor  DVT prophylaxis: Lovenox subQ Code Status: Full Family Communication: Pt in room, son at bedside Disposition Plan: Uncertain at this time  Consultants:     Procedures:     Antimicrobials: Anti-infectives    Start     Dose/Rate Route Frequency Ordered Stop   10/31/16 2000  azithromycin (ZITHROMAX) 500 mg in dextrose 5 % 250 mL IVPB     500 mg 250 mL/hr over 60 Minutes Intravenous Every 24 hours 10/30/16 2217 11/06/16 1959   10/31/16 1900  cefTRIAXone (ROCEPHIN) 1 g in dextrose 5 % 50 mL IVPB     1 g 100  mL/hr over 30 Minutes Intravenous Every 24 hours 10/30/16 2217 11/06/16 1859   10/30/16 1900  cefTRIAXone (ROCEPHIN) 1 g in dextrose 5 % 50 mL IVPB     1 g 100 mL/hr over 30 Minutes Intravenous  Once 10/30/16 1854 10/30/16 2047   10/30/16 1900  azithromycin (ZITHROMAX) 500 mg in dextrose 5 % 250 mL IVPB     500 mg 250 mL/hr over 60 Minutes Intravenous  Once 10/30/16 1854 10/30/16 2148       Subjective: No complaints this AM  Objective: Vitals:   10/30/16 2130 10/30/16 2259 10/31/16 1048 10/31/16 1432  BP: 134/68 129/90 128/63 139/87  Pulse: 96 93 (!) 103 99  Resp:  19  18  Temp:  97.1 F (36.2 C)  98.1 F (36.7 C)  TempSrc:  Oral  Oral  SpO2: 96% 94%  94%    Intake/Output Summary (Last 24 hours) at 10/31/16 1516 Last data filed at 10/31/16 1433  Gross per 24 hour  Intake          1531.25 ml  Output              200 ml  Net          1331.25 ml   There were no vitals filed for this visit.  Examination:  General exam: Appears calm and comfortable  Respiratory system: Clear to auscultation. Respiratory effort normal. Cardiovascular system: S1 & S2 heard  Gastrointestinal system: Abdomen is nondistended, soft and nontender. No organomegaly or masses felt. Normal bowel sounds heard. Central nervous system: Alert and oriented. No focal neurological deficits. Extremities: Symmetric 5 x 5 power. Skin: No rashes, lesions or ulcers Psychiatry: Judgement and insight appear normal. Mood & affect appropriate.   Data Reviewed: I have personally reviewed following labs and imaging studies  CBC:  Recent Labs Lab 10/30/16 1732 10/31/16 0802  WBC 10.9* 9.7  NEUTROABS 8.1*  --   HGB 13.5 12.8  HCT 39.4 37.5  MCV 92.7 92.1  PLT 227 676   Basic Metabolic Panel:  Recent Labs Lab 10/30/16 1732 10/31/16 0802  NA 128* 127*  K 3.3* 3.2*  CL 92* 89*  CO2 24 26  GLUCOSE 139* 116*  BUN 16 10  CREATININE 0.80 0.70  CALCIUM 9.1 8.6*   GFR: CrCl cannot be calculated  (Unknown ideal weight.). Liver Function Tests:  Recent Labs Lab 10/30/16 1732  AST 30  ALT 19  ALKPHOS 80  BILITOT 1.1  PROT 6.7  ALBUMIN 2.9*   No results for input(s): LIPASE, AMYLASE in the last 168 hours. No results for input(s): AMMONIA in the last 168 hours. Coagulation Profile: No results for input(s): INR, PROTIME in the last 168 hours. Cardiac Enzymes: No results for input(s): CKTOTAL, CKMB, CKMBINDEX, TROPONINI in the last 168 hours. BNP (last 3 results) No results for input(s): PROBNP in the last 8760 hours. HbA1C: No results for input(s): HGBA1C in the last 72 hours. CBG: No results for input(s): GLUCAP in the last 168 hours. Lipid Profile: No results for input(s): CHOL, HDL, LDLCALC, TRIG, CHOLHDL, LDLDIRECT in the last 72 hours. Thyroid Function Tests: No results for input(s): TSH, T4TOTAL, FREET4, T3FREE, THYROIDAB in the last 72 hours. Anemia Panel: No results for input(s): VITAMINB12, FOLATE, FERRITIN, TIBC, IRON, RETICCTPCT in the last 72 hours. Sepsis Labs:  Recent Labs Lab 10/30/16 1755 10/30/16 2004  LATICACIDVEN 2.45* 1.35    Recent Results (from the past 240 hour(s))  Culture, blood (Routine X 2) w Reflex to ID Panel     Status: None (Preliminary result)   Collection Time: 10/30/16  7:59 PM  Result Value Ref Range Status   Specimen Description BLOOD LEFT ANTECUBITAL  Final   Special Requests BOTTLES DRAWN AEROBIC ONLY 5CC  Final   Culture NO GROWTH < 24 HOURS  Final   Report Status PENDING  Incomplete  Culture, blood (Routine X 2) w Reflex to ID Panel     Status: None (Preliminary result)   Collection Time: 10/30/16  8:00 PM  Result Value Ref Range Status   Specimen Description BLOOD RIGHT ANTECUBITAL  Final   Special Requests BOTTLES DRAWN AEROBIC ONLY 5CC  Final   Culture NO GROWTH < 24 HOURS  Final   Report Status PENDING  Incomplete     Radiology Studies: Dg Chest 2 View  Result Date: 10/30/2016 CLINICAL DATA:  83 y/o  F;  productive cough, fever, and ileus. EXAM: CHEST  2 VIEW COMPARISON:  09/07/2011 chest radiograph. FINDINGS: Right middle and right greater than left lower lobe consolidations. Small right and trace left pleural effusions. Stable cardiac silhouette given projection and technique. Aortic atherosclerosis with calcification. 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 Right maxillary and left upper quadrant surgical clips. IMPRESSION: Right middle and right greater than left lower lobe pneumonia with small effusions. These results will be called to the ordering clinician or representative by the Radiologist Assistant, and communication documented in the PACS or  [facility] Dashboard. Electronically Signed   By: Kristine Garbe M.D.   On: 10/30/2016 18:31    Scheduled Meds: . aspirin EC  325 mg Oral Daily  . azithromycin  500 mg Intravenous Q24H  . cefTRIAXone (ROCEPHIN)  IV  1 g Intravenous Q24H  . dorzolamide  1 drop Right Eye BID  . enoxaparin (LOVENOX) injection  40 mg Subcutaneous Q24H  . levothyroxine  112 mcg Oral QAC breakfast  . loratadine  10 mg Oral Daily  . metoprolol succinate  25 mg Oral Daily  . potassium chloride SA  20 mEq Oral Daily  . timolol  1 drop Right Eye BID   Continuous Infusions: . sodium chloride 75 mL/hr at 10/31/16 0735     LOS: 1 day   Daegan Arizmendi, Orpah Melter, MD [facility] Pager [phone]  If 7PM-7AM, please contact night-coverage [email or web address] Password [facility] 10/31/2016, 3:16 PM

## 2016-11-01 LAB — BASIC METABOLIC PANEL WITH GFR
Anion gap: 11 (ref 5–15)
BUN: 5 mg/dL — ABNORMAL LOW (ref 6–20)
CO2: 25 mmol/L (ref 22–32)
Calcium: 8.3 mg/dL — ABNORMAL LOW (ref 8.9–10.3)
Chloride: 90 mmol/L — ABNORMAL LOW (ref 101–111)
Creatinine, Ser: 0.51 mg/dL (ref 0.44–1.00)
GFR calc Af Amer: >60 mL/min
GFR calc non Af Amer: >60 mL/min
Glucose, Bld: 114 mg/dL — ABNORMAL HIGH (ref 65–99)
Potassium: 3 mmol/L — ABNORMAL LOW (ref 3.5–5.1)
Sodium: 126 mmol/L — ABNORMAL LOW (ref 135–145)

## 2016-11-01 LAB — EXPECTORATED SPUTUM ASSESSMENT W GRAM STAIN, RFLX TO RESP C

## 2016-11-01 MED ORDER — HYDRALAZINE HCL 20 MG/ML IJ SOLN
10.0000 mg | INTRAMUSCULAR | Status: DC | PRN
  Administered 2016-11-01 – 2016-11-02 (×2): 10 mg via INTRAVENOUS
  Filled 2016-11-01 (×2): qty 1

## 2016-11-01 MED ORDER — POTASSIUM CHLORIDE CRYS ER 20 MEQ PO TBCR
40.0000 meq | EXTENDED_RELEASE_TABLET | Freq: Two times a day (BID) | ORAL | Status: AC
Start: 2016-11-01 — End: 2016-11-01
  Administered 2016-11-01 (×2): 40 meq via ORAL
  Filled 2016-11-01 (×2): qty 2

## 2016-11-01 MED ORDER — AZITHROMYCIN 250 MG PO TABS
500.0000 mg | ORAL_TABLET | Freq: Every day | ORAL | Status: AC
  Administered 2016-11-01 – 2016-11-04 (×4): 500 mg via ORAL
  Filled 2016-11-01 (×4): qty 2

## 2016-11-01 NOTE — Evaluation (Signed)
Occupational Therapy Evaluation Patient Details Name: Brandy Huff MRN: [id number] DOB: 1917-09-27 Today's Date: 11/01/2016    History of Present Illness Brandy Huff is a 83 y.o. 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 Started coughing on Sat/ Sun this past weekend, has gotten weaker and now cannot get up and walk with a walker which is her usual baseline.    Clinical Impression   Pt was assisted for showering and all IADL. She walked with a walker. Pt has low vision. She presents with generalized weakness, impaired standing balance and decreased activity tolerance. She requires min to max assist for ADL and transferred this date x 2 with min assist. Family is very supportive and can provide 24 hour assist for pt. Recommending HHOT.    Follow Up Recommendations  Home health OT;Supervision/Assistance - 24 hour    Equipment Recommendations  3 in 1 bedside commode    Recommendations for Other Services       Precautions / Restrictions Precautions Precautions: Fall Restrictions Weight Bearing Restrictions: No      Mobility Bed Mobility   Bed Mobility: Supine to Sit     Supine to sit: Min assist     General bed mobility comments: increased time, assist to elevate trunk  Transfers Overall transfer level: Needs assistance Equipment used: Rolling walker (2 wheeled) Transfers: Sit to/from [facility] Sit to Stand: Min guard Stand pivot transfers: Min assist       General transfer comment: steadying assist and guidance to [facility] then chair due to low vision    Balance Overall balance assessment: Needs assistance;History of Falls Sitting-balance support: No upper extremity supported;Feet supported Sitting balance-Leahy Scale: Fair       Standing balance-Leahy Scale: Poor Standing balance comment: relied on bil UEs  for support.                             ADL Overall ADL's : Needs assistance/impaired Eating/Feeding: Minimal assistance;Sitting Eating/Feeding Details (indicate cue type and reason): assist due to low vision Grooming: Wash/dry hands;Wash/dry face;Sitting;Supervision/safety   Upper Body Bathing: Minimal assistance;Sitting   Lower Body Bathing: Moderate assistance;Sit to/from stand   Upper Body Dressing : Sitting;Moderate assistance   Lower Body Dressing: Maximal assistance;Sit to/from stand   Toilet Transfer: Minimal assistance;BSC;RW   Toileting- Clothing Manipulation and Hygiene: Minimal assistance;Sit to/from stand         General ADL Comments: Pt with decreased activity tolerance and generally weak.     Vision Baseline Vision/History: Macular Degeneration Patient Visual Report: No change from baseline Additional Comments: pt is blind in R eye and has low vision in L     Perception     Praxis      Pertinent Vitals/Pain Pain Assessment: Faces Faces Pain Scale: Hurts a little bit Pain Location: abdomen from coughing Pain Descriptors / Indicators: Sore Pain Intervention(s): Repositioned (instructed in splinting with pillow)     Hand Dominance Right   Extremity/Trunk Assessment Upper Extremity Assessment Upper Extremity Assessment: Generalized weakness   Lower Extremity Assessment Lower Extremity Assessment: Generalized weakness   Cervical / Trunk Assessment Cervical / Trunk Assessment: Kyphotic   Communication Communication Communication: No difficulties (voice is hoarse)   Cognition Arousal/Alertness: Awake/alert Behavior During Therapy: Flat affect Overall Cognitive Status: Impaired/Different from baseline Area of Impairment: Memory;Problem solving  Memory: Decreased short-term memory       Problem Solving: Slow processing     General Comments       Exercises       Shoulder Instructions      Home Living Family/patient  expects to be discharged to:: Private residence Living Arrangements: Children;Other relatives Available Help at Discharge: Family;Available 24 hours/day Type of Home: House Home Access: Stairs to enter [employer] of Steps: 1 Entrance Stairs-Rails: None Home Layout: One level     Bathroom Shower/Tub: Occupational psychologist: Standard     Home Equipment: Shower seat - built in;Walker - 2 wheels;Bedside commode          Prior Functioning/Environment Level of Independence: Needs assistance  Gait / Transfers Assistance Needed: ambulated with RW with assist prn as pt is blind ADL's / Homemaking Assistance Needed: assisted for showers and all IADL             OT Problem List: Decreased strength;Decreased activity tolerance;Impaired balance (sitting and/or standing);Impaired vision/perception;Decreased cognition;Pain      OT Treatment/Interventions: Self-care/ADL training;DME and/or AE instruction;Therapeutic activities;Patient/family education    OT Goals(Current goals can be found in the care plan section) Acute Rehab OT Goals Patient Stated Goal: to go home OT Goal Formulation: With patient Time For Goal Achievement: 11/08/16 Potential to Achieve Goals: Good  OT Frequency: Min 2X/week   Barriers to D/C:            Co-evaluation              End of Session Equipment Utilized During Treatment: Gait belt;Rolling walker  Activity Tolerance: Patient limited by fatigue Patient left: in chair;with call bell/phone within reach;with family/visitor present  OT Visit Diagnosis: Unsteadiness on feet (R26.81);Muscle weakness (generalized) (M62.81);Low vision, both eyes (H54.2);Pain Pain - part of body:  (abdomen)                ADL either performed or assessed with clinical judgement  Time: 9201-0071 OT Time Calculation (min): 32 min Charges:  OT General Charges $OT Visit: 1 Procedure OT Evaluation $OT Eval Moderate Complexity: 1 Procedure OT  Treatments $Self Care/Home Management : 8-22 mins G-Codes:     Malka So 11/01/2016, 10:42 AM  [phone]

## 2016-11-01 NOTE — Care Management Note (Signed)
Case Management Note  Patient Details  Name: Brandy Huff MRN: [id number] Date of Birth: 11/29/1917  Subjective/Objective:                    Action/Plan:   Expected Discharge Date:                  Expected Discharge Plan:  [place]  In-House Referral:     Discharge planning Services  CM Consult  Post Acute Care Choice:    Choice offered to:  Patient, Adult Children  DME Arranged:    DME Agency:     HH Arranged:  RN, PT, OT HH Agency:  [place]  Status of Service:  Completed, signed off  If discussed at [place] of Stay Meetings, dates discussed:    Additional Comments:  Marilu Favre, RN 11/01/2016, 10:17 AM

## 2016-11-01 NOTE — Progress Notes (Signed)
PROGRESS NOTE    Brandy Huff  SHF:[id number] DOB: 1917/10/29 DOA: 10/30/2016 PCP: [name], MD    Brief Narrative:  83 y.o. 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 Started coughing on Sat/ Sun this past weekend, has gotten weaker and now cannot get up and walk with a walker which is her usual baseline.  Subjective fever, no chills, no CP, no abd pain, n/v/d.  No hx PNA, nonsmoker.  In ED, heart rate 102, RR 20, BP's normal.  CXR shows new bibasilar infiltrates and RML infiltrates.  Asked to see for admission.    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 CAP 1. Patient continued on azithromycin and rocephin 2. No leukocytosis 3. Remains afebrile 4. RML and R>LLL PNA with effusions seen on CXR, reviewed 5. For now, will continue supportive care 2. HTN 1. BP presently remains stable 2. Will continue to monitor and titrate meds as needed 3. Hypothyroid 1. Presently stable 2. Patient is continued on levothyroxine 4. Hx colectomy 1. Currently stable at this time 5. Generalized weakness 1. PT/OT consulted 2. Home health PT/OT/RN planned 3. Suspect secondary to dehydration 6. Dehydration 1. Initially clinically dehydrated 2. Mucus membranes no longer dry 7. Hyponatremia 1. Worsening 2. Will check urine sodium 3. Per above, clinically membranes no longer dry 4. Have ordered orthostatic vitals. Pt is not orthostatic 5. Will d/c IVF 6. Keep pt on fluid restricted diet 7. Repeat bmet in AM  DVT prophylaxis: Lovenox subQ Code Status: Full Family Communication: Pt in room, family at bedside Disposition Plan: Uncertain at this time  Consultants:     Procedures:      Antimicrobials: Anti-infectives    Start     Dose/Rate Route Frequency Ordered Stop   11/01/16 2200  azithromycin (ZITHROMAX) tablet 500 mg     500 mg Oral Daily at bedtime 11/01/16 0806 11/05/16 2159   10/31/16 2000  azithromycin (ZITHROMAX) 500 mg in dextrose 5 % 250 mL IVPB  Status:  Discontinued     500 mg 250 mL/hr over 60 Minutes Intravenous Every 24 hours 10/30/16 2217 11/01/16 0806   10/31/16 1900  cefTRIAXone (ROCEPHIN) 1 g in dextrose 5 % 50 mL IVPB     1 g 100 mL/hr over 30 Minutes Intravenous Every 24 hours 10/30/16 2217 11/06/16 1859   10/30/16 1900  cefTRIAXone (ROCEPHIN) 1 g in dextrose 5 % 50 mL IVPB     1 g 100 mL/hr over 30 Minutes Intravenous  Once 10/30/16 1854 10/30/16 2047   10/30/16 1900  azithromycin (ZITHROMAX) 500 mg in dextrose 5 % 250 mL IVPB     500 mg 250 mL/hr over 60 Minutes Intravenous  Once 10/30/16 1854 10/30/16 2148      Subjective: Currently without complaints  Objective: Vitals:   10/31/16 1432 10/31/16 2111 11/01/16 0547 11/01/16 0951  BP: 139/87 (!) 166/73 (!) 159/85 (!) 151/89  Pulse: 99 96 (!) 122 98  Resp: 18 18 18    Temp: 98.1 F (36.7 C) 98.2 F (36.8 C) 97.6 F (36.4 C)   TempSrc: Oral Oral Axillary   SpO2: 94%  96%     Intake/Output Summary (Last 24  hours) at 11/01/16 1430 Last data filed at 11/01/16 1497  Gross per 24 hour  Intake           2937.5 ml  Output             1301 ml  Net           1636.5 ml   There were no vitals filed for this visit.  Examination:  General exam: Awake, laying in bed, in nad Respiratory system: normal resp effort, no audible wheezing Cardiovascular system: regular rate, s1-2 Gastrointestinal system: soft, nondistended, pos BS Central nervous system: cn2-12 grossly intact, strength intact. Extremities: perfused, no clubbing Skin: normal skin turgor, no notable skin lesions seen Psychiatry: mood normal// no visual hallucinations  Data Reviewed: I have personally reviewed  following labs and imaging studies  CBC:  Recent Labs Lab 10/30/16 1732 10/31/16 0802  WBC 10.9* 9.7  NEUTROABS 8.1*  --   HGB 13.5 12.8  HCT 39.4 37.5  MCV 92.7 92.1  PLT 227 026   Basic Metabolic Panel:  Recent Labs Lab 10/30/16 1732 10/31/16 0802 11/01/16 0502  NA 128* 127* 126*  K 3.3* 3.2* 3.0*  CL 92* 89* 90*  CO2 24 26 25   GLUCOSE 139* 116* 114*  BUN 16 10 5*  CREATININE 0.80 0.70 0.51  CALCIUM 9.1 8.6* 8.3*   GFR: CrCl cannot be calculated (Unknown ideal weight.). Liver Function Tests:  Recent Labs Lab 10/30/16 1732  AST 30  ALT 19  ALKPHOS 80  BILITOT 1.1  PROT 6.7  ALBUMIN 2.9*   No results for input(s): LIPASE, AMYLASE in the last 168 hours. No results for input(s): AMMONIA in the last 168 hours. Coagulation Profile: No results for input(s): INR, PROTIME in the last 168 hours. Cardiac Enzymes: No results for input(s): CKTOTAL, CKMB, CKMBINDEX, TROPONINI in the last 168 hours. BNP (last 3 results) No results for input(s): PROBNP in the last 8760 hours. HbA1C: No results for input(s): HGBA1C in the last 72 hours. CBG: No results for input(s): GLUCAP in the last 168 hours. Lipid Profile: No results for input(s): CHOL, HDL, LDLCALC, TRIG, CHOLHDL, LDLDIRECT in the last 72 hours. Thyroid Function Tests: No results for input(s): TSH, T4TOTAL, FREET4, T3FREE, THYROIDAB in the last 72 hours. Anemia Panel: No results for input(s): VITAMINB12, FOLATE, FERRITIN, TIBC, IRON, RETICCTPCT in the last 72 hours. Sepsis Labs:  Recent Labs Lab 10/30/16 1755 10/30/16 2004  LATICACIDVEN 2.45* 1.35    Recent Results (from the past 240 hour(s))  Culture, blood (Routine X 2) w Reflex to ID Panel     Status: None (Preliminary result)   Collection Time: 10/30/16  7:59 PM  Result Value Ref Range Status   Specimen Description BLOOD LEFT ANTECUBITAL  Final   Special Requests BOTTLES DRAWN AEROBIC ONLY 5CC  Final   Culture NO GROWTH < 24 HOURS  Final    Report Status PENDING  Incomplete  Culture, blood (Routine X 2) w Reflex to ID Panel     Status: None (Preliminary result)   Collection Time: 10/30/16  8:00 PM  Result Value Ref Range Status   Specimen Description BLOOD RIGHT ANTECUBITAL  Final   Special Requests BOTTLES DRAWN AEROBIC ONLY 5CC  Final   Culture NO GROWTH < 24 HOURS  Final   Report Status PENDING  Incomplete  Culture, sputum-assessment     Status: None   Collection Time: 10/31/16  3:51 PM  Result Value Ref Range Status   Specimen Description EXPECTORATED SPUTUM  Final   Special Requests NONE  Final   Sputum evaluation   Final    Sputum specimen not acceptable for testing.  Please recollect.   Naoma Diener RN [id number] 10/31/16 A BROWNING    Report Status 11/01/2016 FINAL  Final     Radiology Studies: Dg Chest 2 View  Result Date: 10/30/2016 CLINICAL DATA:  83 y/o  F; productive cough, fever, and ileus. EXAM: CHEST  2 VIEW COMPARISON:  09/07/2011 chest radiograph. FINDINGS: Right middle and right greater than left lower lobe consolidations. Small right and trace left pleural effusions. Stable cardiac silhouette given projection and technique. Aortic atherosclerosis with calcification. 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 Right maxillary and left upper quadrant surgical clips. IMPRESSION: Right middle and right greater than left lower lobe pneumonia with small effusions. These results will be called to the ordering clinician or representative by the Radiologist Assistant, and communication documented in the PACS or zVision Dashboard. Electronically Signed   By: Kristine Garbe M.D.   On: 10/30/2016 18:31    Scheduled Meds: . aspirin EC  325 mg Oral Daily  . azithromycin  500 mg Oral QHS  . cefTRIAXone (ROCEPHIN)  IV  1 g Intravenous Q24H  . dorzolamide  1 drop Right Eye BID  . enoxaparin (LOVENOX) injection  40 mg Subcutaneous Q24H  . levothyroxine  112 mcg Oral QAC breakfast  . loratadine  10 mg Oral  Daily  . metoprolol succinate  25 mg Oral Daily  . potassium chloride  40 mEq Oral BID  . timolol  1 drop Right Eye BID   Continuous Infusions:    LOS: 2 days   CHIU, Orpah Melter, MD [facility] Pager [phone]  If 7PM-7AM, please contact night-coverage [email or web address] Password [email or web address] 11/01/2016, 2:30 PM

## 2016-11-02 LAB — MAGNESIUM: Magnesium: 1.7 mg/dL (ref 1.7–2.4)

## 2016-11-02 LAB — SODIUM, URINE, RANDOM: SODIUM UR: 74 mmol/L

## 2016-11-02 LAB — BASIC METABOLIC PANEL
Anion gap: 10 (ref 5–15)
CALCIUM: 8.8 mg/dL — AB (ref 8.9–10.3)
CO2: 25 mmol/L (ref 22–32)
Chloride: 93 mmol/L — ABNORMAL LOW (ref 101–111)
Creatinine, Ser: 0.55 mg/dL (ref 0.44–1.00)
GFR calc Af Amer: >60 mL/min (ref >60)
GLUCOSE: 123 mg/dL — AB (ref 65–99)
POTASSIUM: 3.8 mmol/L (ref 3.5–5.1)
Sodium: 128 mmol/L — ABNORMAL LOW (ref 135–145)

## 2016-11-02 MED ORDER — CEFPODOXIME PROXETIL 200 MG PO TABS
200.0000 mg | ORAL_TABLET | Freq: Two times a day (BID) | ORAL | Status: DC
  Administered 2016-11-02 – 2016-11-05 (×6): 200 mg via ORAL
  Filled 2016-11-02 (×6): qty 1

## 2016-11-02 MED ORDER — LISINOPRIL 5 MG PO TABS
5.0000 mg | ORAL_TABLET | Freq: Every day | ORAL | Status: DC
  Administered 2016-11-02 – 2016-11-03 (×2): 5 mg via ORAL
  Filled 2016-11-02 (×2): qty 1

## 2016-11-02 NOTE — Progress Notes (Signed)
RN unaware patient's BP at 1300 being high, and was not told by the NT. When I saw the recorded BP,we rechecked and it was lower than the first check but was still elevated. I gave prn hydralazine, will recheck later. I also notified MD Wyline Copas, he said he would look at it and see if we can adjust some medicine for her.

## 2016-11-02 NOTE — Progress Notes (Signed)
PROGRESS NOTE    CHIANNE BYRNS  YQM:[id number] DOB: 03-08-1918 DOA: 10/30/2016 PCP: [name], MD    Brief Narrative:  83 y.o. 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 Started coughing on Sat/ Sun this past weekend, has gotten weaker and now cannot get up and walk with a walker which is her usual baseline.  Subjective fever, no chills, no CP, no abd pain, n/v/d.  No hx PNA, nonsmoker.  In ED, heart rate 102, RR 20, BP's normal.  CXR shows new bibasilar infiltrates and RML infiltrates.  Asked to see for admission.    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 CAP 1. Patient had been continued on azithromycin and rocephin 2. Had been without leukocytosis 3. Remains afebrile 4. Will transition to PO abx, azithromycin plus vantin 2. HTN 1. BP poorly controlled 2. Metoprolol continued 3. Will resume home lisinopril 4. Have ordered PRN IV hydralazine 3. Hypothyroid 1. Presently stable 2. Patient is continued on levothyroxine as tolerated 4. Hx colectomy 1. Presently stable at this time 5. Generalized weakness 1. PT/OT consulted 2. Home health PT/OT/RN is planned 3. Suspect secondary to dehydration 6. Dehydration 1. Initially clinically dehydrated at admission 2. Mucus membranes no longer dry 7. Hyponatremia 1. Sodium down to 126 2. Urine sodium elevated 3. Clinically no longer appears dehydrated 4. Pt voiding frequently 5. Suspect possible underlying SIADH 6. Fluid restrict patient 7. Sodium improved to 128 today 8. Repeat bmet in aM  DVT prophylaxis: Lovenox subQ Code Status: Full Family Communication: Pt in room, family at bedside Disposition Plan: Uncertain at  this time  Consultants:     Procedures:     Antimicrobials: Anti-infectives    Start     Dose/Rate Route Frequency Ordered Stop   11/02/16 2200  cefpodoxime (VANTIN) tablet 200 mg     200 mg Oral Every 12 hours 11/02/16 1713     11/01/16 2200  azithromycin (ZITHROMAX) tablet 500 mg     500 mg Oral Daily at bedtime 11/01/16 0806 11/05/16 2159   10/31/16 2000  azithromycin (ZITHROMAX) 500 mg in dextrose 5 % 250 mL IVPB  Status:  Discontinued     500 mg 250 mL/hr over 60 Minutes Intravenous Every 24 hours 10/30/16 2217 11/01/16 0806   10/31/16 1900  cefTRIAXone (ROCEPHIN) 1 g in dextrose 5 % 50 mL IVPB  Status:  Discontinued     1 g 100 mL/hr over 30 Minutes Intravenous Every 24 hours 10/30/16 2217 11/02/16 1713   10/30/16 1900  cefTRIAXone (ROCEPHIN) 1 g in dextrose 5 % 50 mL IVPB     1 g 100 mL/hr over 30 Minutes Intravenous  Once 10/30/16 1854 10/30/16 2047   10/30/16 1900  azithromycin (ZITHROMAX) 500 mg in dextrose 5 % 250 mL IVPB     500 mg 250 mL/hr over 60 Minutes Intravenous  Once 10/30/16 1854 10/30/16 2148      Subjective: No complaints  Objective: Vitals:   11/01/16 2233 11/02/16 0506 11/02/16 1309 11/02/16 1556  BP: 116/65 (!) 164/83 (!) 189/93 (!) 168/79  Pulse:  98 89   Resp:  17 18   Temp:  97.5 F (36.4  C) 97.5 F (36.4 C)   TempSrc:  Oral Oral   SpO2:  94%      Intake/Output Summary (Last 24 hours) at 11/02/16 1718 Last data filed at 11/02/16 1525  Gross per 24 hour  Intake              170 ml  Output              751 ml  Net             -581 ml   There were no vitals filed for this visit.  Examination:  General exam: conversant, laying in bed, in nad Respiratory system: normal chest rise, no wheezing Cardiovascular system: regular rhythm, s1-s2 on auscultation Gastrointestinal system: nondistended, pos BS, nontender Central nervous system: no seizures, no tremors Extremities: no cyanosis, no joint deformities Skin: no pallor, no  rashes Psychiatry: affect normal// no auditory hallucinations  Data Reviewed: I have personally reviewed following labs and imaging studies  CBC:  Recent Labs Lab 10/30/16 1732 10/31/16 0802  WBC 10.9* 9.7  NEUTROABS 8.1*  --   HGB 13.5 12.8  HCT 39.4 37.5  MCV 92.7 92.1  PLT 227 570   Basic Metabolic Panel:  Recent Labs Lab 10/30/16 1732 10/31/16 0802 11/01/16 0502 11/02/16 0646  NA 128* 127* 126* 128*  K 3.3* 3.2* 3.0* 3.8  CL 92* 89* 90* 93*  CO2 24 26 25 25   GLUCOSE 139* 116* 114* 123*  BUN 16 10 5* <5*  CREATININE 0.80 0.70 0.51 0.55  CALCIUM 9.1 8.6* 8.3* 8.8*  MG  --   --   --  1.7   GFR: CrCl cannot be calculated (Unknown ideal weight.). Liver Function Tests:  Recent Labs Lab 10/30/16 1732  AST 30  ALT 19  ALKPHOS 80  BILITOT 1.1  PROT 6.7  ALBUMIN 2.9*   No results for input(s): LIPASE, AMYLASE in the last 168 hours. No results for input(s): AMMONIA in the last 168 hours. Coagulation Profile: No results for input(s): INR, PROTIME in the last 168 hours. Cardiac Enzymes: No results for input(s): CKTOTAL, CKMB, CKMBINDEX, TROPONINI in the last 168 hours. BNP (last 3 results) No results for input(s): PROBNP in the last 8760 hours. HbA1C: No results for input(s): HGBA1C in the last 72 hours. CBG: No results for input(s): GLUCAP in the last 168 hours. Lipid Profile: No results for input(s): CHOL, HDL, LDLCALC, TRIG, CHOLHDL, LDLDIRECT in the last 72 hours. Thyroid Function Tests: No results for input(s): TSH, T4TOTAL, FREET4, T3FREE, THYROIDAB in the last 72 hours. Anemia Panel: No results for input(s): VITAMINB12, FOLATE, FERRITIN, TIBC, IRON, RETICCTPCT in the last 72 hours. Sepsis Labs:  Recent Labs Lab 10/30/16 1755 10/30/16 2004  LATICACIDVEN 2.45* 1.35    Recent Results (from the past 240 hour(s))  Culture, blood (Routine X 2) w Reflex to ID Panel     Status: None (Preliminary result)   Collection Time: 10/30/16  7:59 PM  Result  Value Ref Range Status   Specimen Description BLOOD LEFT ANTECUBITAL  Final   Special Requests BOTTLES DRAWN AEROBIC ONLY 5CC  Final   Culture NO GROWTH 3 DAYS  Final   Report Status PENDING  Incomplete  Culture, blood (Routine X 2) w Reflex to ID Panel     Status: None (Preliminary result)   Collection Time: 10/30/16  8:00 PM  Result Value Ref Range Status   Specimen Description BLOOD RIGHT ANTECUBITAL  Final   Special Requests BOTTLES DRAWN AEROBIC ONLY  5CC  Final   Culture NO GROWTH 3 DAYS  Final   Report Status PENDING  Incomplete  Culture, sputum-assessment     Status: None   Collection Time: 10/31/16  3:51 PM  Result Value Ref Range Status   Specimen Description EXPECTORATED SPUTUM  Final   Special Requests NONE  Final   Sputum evaluation   Final    Sputum specimen not acceptable for testing.  Please recollect.   Naoma Diener RN [id number] 10/31/16 A BROWNING    Report Status 11/01/2016 FINAL  Final     Radiology Studies: No results found.  Scheduled Meds: . aspirin EC  325 mg Oral Daily  . azithromycin  500 mg Oral QHS  . cefpodoxime  200 mg Oral Q12H  . dorzolamide  1 drop Right Eye BID  . enoxaparin (LOVENOX) injection  40 mg Subcutaneous Q24H  . levothyroxine  112 mcg Oral QAC breakfast  . lisinopril  5 mg Oral Daily  . loratadine  10 mg Oral Daily  . metoprolol succinate  25 mg Oral Daily  . timolol  1 drop Right Eye BID   Continuous Infusions:    LOS: 3 days   Edithe Dobbin, Orpah Melter, MD [facility] Pager [phone]  If 7PM-7AM, please contact night-coverage [email or web address] Password [email or web address] 11/02/2016, 5:18 PM

## 2016-11-02 NOTE — Progress Notes (Signed)
Physical Therapy Treatment Patient Details Name: Brandy Huff MRN: [id number] DOB: [birth year] Today's Date: 11/02/2016    History of Present Illness Brandy Huff is a 83 y.o. 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 Started coughing on Sat/ Sun this past weekend, has gotten weaker and now cannot get up and walk with a walker which is her usual baseline.     PT Comments    Patient is making good progress with PT.  From a mobility standpoint anticipate patient will be ready for DC home. Pt able to ambulate short distances in her room with min Ax1 for stability. Pt received stair training and was able to ascend/descend 1 step with min Ax1 making it possible for her to get into her home. Pt requires assistance in her transfers and gait and should have 24 hour assistance in her home environment to be safe.  Pt requires continued skilled PT for progressing her gait training and for improving strength and endurance to be safe in her environment.     Follow Up Recommendations  Home health PT;Supervision/Assistance - 24 hour (HHOT)     Equipment Recommendations  None recommended by PT    Recommendations for Other Services       Precautions / Restrictions Precautions Precautions: Fall Restrictions Weight Bearing Restrictions: No    Mobility  Bed Mobility               General bed mobility comments: sitting in recliner  Transfers Overall transfer level: Needs assistance Equipment used: Rolling walker (2 wheeled) Transfers: Sit to/from [facility] Sit to Stand: Min assist;Min guard Stand pivot transfers: Min assist       General transfer comment:  minA for getting to upright and for movement of R LE in stand pivot to the [facility]  [place]  Ambulation/Gait Ambulation/Gait assistance: Min assist Ambulation Distance (Feet): 9 Feet  (3x3) Assistive device: Rolling walker (2 wheeled) Gait Pattern/deviations: Narrow base of support;Trunk flexed;Step-to pattern;Decreased step length - right;Decreased step length - left;Shuffle Gait velocity: slow Gait velocity interpretation: Below normal speed for age/gender General Gait Details: difficulty with pivoting and turning to get to chair less assist for straightline, decreased foot clearance   Stairs Stairs: Yes   Stair Management: One rail Right;Sideways;Step to pattern Number of Stairs: 1 General stair comments: pt with enough LE power to ascend/descend 1x2 steps into home  Wheelchair Mobility    Modified Rankin (Stroke Patients Only)       Balance Overall balance assessment: Needs assistance;History of Falls Sitting-balance support: No upper extremity supported;Feet supported Sitting balance-Leahy Scale: Fair     Standing balance support: Bilateral upper extremity supported;During functional activity Standing balance-Leahy Scale: Poor Standing balance comment: relied on bil UEs for support.                     Cognition Arousal/Alertness: Awake/alert Behavior During Therapy: Flat affect Overall Cognitive Status: Within Functional Limits for tasks assessed Area of Impairment: Memory;Safety/judgement;Following commands     Memory: Decreased recall of precautions Following Commands: Follows one step commands inconsistently Safety/Judgement: Decreased awareness of deficits   Problem Solving: Slow processing General Comments: slow to process vc for hand placement and moves with decreased safety awareness            Pertinent Vitals/Pain Pain Assessment: Faces Pain Score: 2  Pain Location: abdomen  Pain Descriptors / Indicators: Sore  VSS           PT Goals (current goals can now be found in the care plan section) Acute Rehab PT Goals Patient Stated Goal: to go home PT Goal Formulation: With patient Time For Goal Achievement:  11/14/16 Potential to Achieve Goals: Good Progress towards PT goals: Progressing toward goals    Frequency    Min 3X/week      PT Plan Current plan remains appropriate    Co-evaluation             End of Session Equipment Utilized During Treatment: Gait belt Activity Tolerance: Patient tolerated treatment well Patient left: in chair;with call bell/phone within reach;with chair alarm set;with family/visitor present Nurse Communication: Mobility status PT Visit Diagnosis: Unsteadiness on feet (R26.81);Muscle weakness (generalized) (M62.81)     Time: 9323-5573 PT Time Calculation (min) (ACUTE ONLY): 43 min  Charges:  $Gait Training: 23-37 mins $Therapeutic Activity: 8-22 mins                    G Codes:       Bailey Mech Fleet 11/02/2016, 12:04 PM  Dani Gobble. Migdalia Dk PT, DPT Acute Rehabilitation  [phone] Pager [phone]

## 2016-11-03 LAB — BASIC METABOLIC PANEL
ANION GAP: 10 (ref 5–15)
BUN: 8 mg/dL (ref 6–20)
CALCIUM: 8.8 mg/dL — AB (ref 8.9–10.3)
CO2: 24 mmol/L (ref 22–32)
CREATININE: 0.55 mg/dL (ref 0.44–1.00)
Chloride: 95 mmol/L — ABNORMAL LOW (ref 101–111)
Glucose, Bld: 109 mg/dL — ABNORMAL HIGH (ref 65–99)
Potassium: 3.3 mmol/L — ABNORMAL LOW (ref 3.5–5.1)
SODIUM: 129 mmol/L — AB (ref 135–145)

## 2016-11-03 MED ORDER — POTASSIUM CHLORIDE CRYS ER 20 MEQ PO TBCR
40.0000 meq | EXTENDED_RELEASE_TABLET | Freq: Two times a day (BID) | ORAL | Status: AC
  Administered 2016-11-03 (×2): 40 meq via ORAL
  Filled 2016-11-03 (×2): qty 2

## 2016-11-03 NOTE — Progress Notes (Signed)
PROGRESS NOTE    Brandy Huff  GYJ:[id number] DOB: 1917-10-03 DOA: 10/30/2016 PCP: [name], MD    Brief Narrative:  83 y.o. 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 Started coughing on Sat/ Sun this past weekend, has gotten weaker and now cannot get up and walk with a walker which is her usual baseline.  Subjective fever, no chills, no CP, no abd pain, n/v/d.  No hx PNA, nonsmoker.  In ED, heart rate 102, RR 20, BP's normal.  CXR shows new bibasilar infiltrates and RML infiltrates.  Asked to see for admission.    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 CAP 1. Patient was initially started on azithromycin and rocephin 2. Had been without leukocytosis 3. Remains afebrile 4. Currently tolerating PO azithromycin plus vantin 5. Stable 2. HTN 1. Metoprolol and lisinopril are continued 2. Continue PRN IV hydralazine 3. BP improved 3. Hypothyroid 1. Currently stable 2. Patient is continued on levothyroxine as tolerated 4. Hx colectomy 1. currently stable 5. Generalized weakness 1. PT/OT consulted 2. Home health PT/OT/RN recommended 3. Suspect secondary to dehydration with hyponatremia 6. Dehydration 1. Initially clinically dehydrated at admission 2. Improved with IVF hydration 3. Mucus membranes no longer dry 7. Hyponatremia 1. Sodium with low of 126 2. Urine sodium elevated 3. Clinically no longer appears dehydrated 4. Pt reports continuing to void frequently 5. Continue with fluid restriction 6. Sodium improved to 129 today 7. Have added salt tabs 8. Recheck bmet in aM  DVT prophylaxis: Lovenox subQ Code Status: Full Family Communication: Pt in room, family  at bedside Disposition Plan: Uncertain at this time  Consultants:     Procedures:     Antimicrobials: Anti-infectives    Start     Dose/Rate Route Frequency Ordered Stop   11/02/16 2200  cefpodoxime (VANTIN) tablet 200 mg     200 mg Oral Every 12 hours 11/02/16 1713     11/01/16 2200  azithromycin (ZITHROMAX) tablet 500 mg     500 mg Oral Daily at bedtime 11/01/16 0806 11/05/16 2159   10/31/16 2000  azithromycin (ZITHROMAX) 500 mg in dextrose 5 % 250 mL IVPB  Status:  Discontinued     500 mg 250 mL/hr over 60 Minutes Intravenous Every 24 hours 10/30/16 2217 11/01/16 0806   10/31/16 1900  cefTRIAXone (ROCEPHIN) 1 g in dextrose 5 % 50 mL IVPB  Status:  Discontinued     1 g 100 mL/hr over 30 Minutes Intravenous Every 24 hours 10/30/16 2217 11/02/16 1713   10/30/16 1900  cefTRIAXone (ROCEPHIN) 1 g in dextrose 5 % 50 mL IVPB     1 g 100 mL/hr over 30 Minutes Intravenous  Once 10/30/16 1854 10/30/16 2047   10/30/16 1900  azithromycin (ZITHROMAX) 500 mg in dextrose 5 % 250 mL IVPB     500 mg 250 mL/hr over 60 Minutes Intravenous  Once 10/30/16 1854 10/30/16 2148      Subjective: Without complaints. States feeling better  Objective: Vitals:   11/02/16 2117 11/03/16 0500 11/03/16 0600 11/03/16 1330  BP: 125/60 (!) 143/77  (!) 145/79  Pulse: (!) 105 92  93  Resp: 17 16  16  Temp: 98 F (36.7 C) 97.5 F (36.4 C)  97.9 F (36.6 C)  TempSrc: Oral Oral  Oral  SpO2: 95%  95% 97%    Intake/Output Summary (Last 24 hours) at 11/03/16 1608 Last data filed at 11/03/16 1414  Gross per 24 hour  Intake              320 ml  Output                0 ml  Net              320 ml   There were no vitals filed for this visit.  Examination:  General exam: Laying in bed, awake, in nad Respiratory system: normal resp effort, no audible wheezing Cardiovascular system: regular rate, s1-2 Gastrointestinal system: soft, pos BS, nondistended Central nervous system: cn2-12 grossly intact,  strength intact Extremities: perfused, no joint deformities Skin: no cyanosis, no rashes Psychiatry: mood normal// no visual hallucinations  Data Reviewed: I have personally reviewed following labs and imaging studies  CBC:  Recent Labs Lab 10/30/16 1732 10/31/16 0802  WBC 10.9* 9.7  NEUTROABS 8.1*  --   HGB 13.5 12.8  HCT 39.4 37.5  MCV 92.7 92.1  PLT 227 161   Basic Metabolic Panel:  Recent Labs Lab 10/30/16 1732 10/31/16 0802 11/01/16 0502 11/02/16 0646 11/03/16 0601  NA 128* 127* 126* 128* 129*  K 3.3* 3.2* 3.0* 3.8 3.3*  CL 92* 89* 90* 93* 95*  CO2 24 26 25 25 24   GLUCOSE 139* 116* 114* 123* 109*  BUN 16 10 5* <5* 8  CREATININE 0.80 0.70 0.51 0.55 0.55  CALCIUM 9.1 8.6* 8.3* 8.8* 8.8*  MG  --   --   --  1.7  --    GFR: CrCl cannot be calculated (Unknown ideal weight.). Liver Function Tests:  Recent Labs Lab 10/30/16 1732  AST 30  ALT 19  ALKPHOS 80  BILITOT 1.1  PROT 6.7  ALBUMIN 2.9*   No results for input(s): LIPASE, AMYLASE in the last 168 hours. No results for input(s): AMMONIA in the last 168 hours. Coagulation Profile: No results for input(s): INR, PROTIME in the last 168 hours. Cardiac Enzymes: No results for input(s): CKTOTAL, CKMB, CKMBINDEX, TROPONINI in the last 168 hours. BNP (last 3 results) No results for input(s): PROBNP in the last 8760 hours. HbA1C: No results for input(s): HGBA1C in the last 72 hours. CBG: No results for input(s): GLUCAP in the last 168 hours. Lipid Profile: No results for input(s): CHOL, HDL, LDLCALC, TRIG, CHOLHDL, LDLDIRECT in the last 72 hours. Thyroid Function Tests: No results for input(s): TSH, T4TOTAL, FREET4, T3FREE, THYROIDAB in the last 72 hours. Anemia Panel: No results for input(s): VITAMINB12, FOLATE, FERRITIN, TIBC, IRON, RETICCTPCT in the last 72 hours. Sepsis Labs:  Recent Labs Lab 10/30/16 1755 10/30/16 2004  LATICACIDVEN 2.45* 1.35    Recent Results (from the past 240 hour(s))    Culture, blood (Routine X 2) w Reflex to ID Panel     Status: None (Preliminary result)   Collection Time: 10/30/16  7:59 PM  Result Value Ref Range Status   Specimen Description BLOOD LEFT ANTECUBITAL  Final   Special Requests BOTTLES DRAWN AEROBIC ONLY 5CC  Final   Culture NO GROWTH 4 DAYS  Final   Report Status PENDING  Incomplete  Culture, blood (Routine X 2) w Reflex to ID Panel     Status: None (Preliminary result)   Collection Time: 10/30/16  8:00 PM  Result Value Ref Range Status   Specimen Description BLOOD RIGHT ANTECUBITAL  Final   Special Requests BOTTLES DRAWN AEROBIC ONLY 5CC  Final   Culture NO GROWTH 4 DAYS  Final   Report Status PENDING  Incomplete  Culture, sputum-assessment     Status: None   Collection Time: 10/31/16  3:51 PM  Result Value Ref Range Status   Specimen Description EXPECTORATED SPUTUM  Final   Special Requests NONE  Final   Sputum evaluation   Final    Sputum specimen not acceptable for testing.  Please recollect.   Naoma Diener RN [id number] 10/31/16 A BROWNING    Report Status 11/01/2016 FINAL  Final     Radiology Studies: No results found.  Scheduled Meds: . aspirin EC  325 mg Oral Daily  . azithromycin  500 mg Oral QHS  . cefpodoxime  200 mg Oral Q12H  . dorzolamide  1 drop Right Eye BID  . enoxaparin (LOVENOX) injection  40 mg Subcutaneous Q24H  . levothyroxine  112 mcg Oral QAC breakfast  . lisinopril  5 mg Oral Daily  . loratadine  10 mg Oral Daily  . metoprolol succinate  25 mg Oral Daily  . potassium chloride  40 mEq Oral BID  . timolol  1 drop Right Eye BID   Continuous Infusions:    LOS: 4 days   Shandrea Lusk, Orpah Melter, MD [facility] Pager [phone]  If 7PM-7AM, please contact night-coverage [email or web address] Password [facility] 11/03/2016, 4:08 PM

## 2016-11-04 LAB — CULTURE, BLOOD (ROUTINE X 2)
Culture: NO GROWTH
Culture: NO GROWTH

## 2016-11-04 LAB — BASIC METABOLIC PANEL
Anion gap: 10 (ref 5–15)
BUN: 6 mg/dL (ref 6–20)
CO2: 23 mmol/L (ref 22–32)
CREATININE: 0.63 mg/dL (ref 0.44–1.00)
Calcium: 9 mg/dL (ref 8.9–10.3)
Chloride: 96 mmol/L — ABNORMAL LOW (ref 101–111)
GFR calc Af Amer: >60 mL/min (ref >60)
GFR calc non Af Amer: >60 mL/min (ref >60)
Glucose, Bld: 130 mg/dL — ABNORMAL HIGH (ref 65–99)
Potassium: 4.3 mmol/L (ref 3.5–5.1)
SODIUM: 129 mmol/L — AB (ref 135–145)

## 2016-11-04 MED ORDER — LISINOPRIL 10 MG PO TABS
10.0000 mg | ORAL_TABLET | Freq: Every day | ORAL | Status: DC
  Administered 2016-11-04 – 2016-11-05 (×2): 10 mg via ORAL
  Filled 2016-11-04 (×2): qty 1

## 2016-11-04 MED ORDER — ENSURE ENLIVE PO LIQD
237.0000 mL | Freq: Two times a day (BID) | ORAL | Status: DC
  Administered 2016-11-04 – 2016-11-05 (×3): 237 mL via ORAL

## 2016-11-04 MED ORDER — SODIUM CHLORIDE 1 G PO TABS
1.0000 g | ORAL_TABLET | Freq: Three times a day (TID) | ORAL | Status: DC
  Administered 2016-11-04 – 2016-11-05 (×4): 1 g via ORAL
  Filled 2016-11-04 (×5): qty 1

## 2016-11-04 NOTE — Progress Notes (Signed)
Occupational Therapy Treatment Patient Details Name: Brandy Huff MRN: [id number] DOB: 09-22-1917 Today's Date: 11/04/2016    History of present illness Brandy Huff is a 83 y.o. 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 Started coughing on Sat/ Nancy Fetter this past weekend, and was too weak to use her RW to get around which is her baseline.    OT comments  Progressing towards acute OT goals. Focus of session was toilet transfer and working on activity tolerance. Son present and involved during session. D/c plan remains appropriate.     Follow Up Recommendations  Home health OT;Supervision/Assistance - 24 hour    Equipment Recommendations  3 in 1 bedside commode    Recommendations for Other Services      Precautions / Restrictions Precautions Precautions: Fall Restrictions Weight Bearing Restrictions: No       Mobility Bed Mobility Overal bed mobility: Needs Assistance Bed Mobility: Supine to Sit     Supine to sit: Min assist     General bed mobility comments: min A to powerup to EOB  Transfers Overall transfer level: Needs assistance Equipment used: Rolling walker (2 wheeled) Transfers: Sit to/from [facility] Sit to Stand: Min assist;Min guard Stand pivot transfers: Min guard;Min assist       General transfer comment: EOB>BSC>recliner. Steadying assist but mostly min guard.    Balance Overall balance assessment: Needs assistance;History of Falls Sitting-balance support: No upper extremity supported;Feet supported Sitting balance-Leahy Scale: Fair     Standing balance support: Bilateral upper extremity supported;During functional activity Standing balance-Leahy Scale: Poor Standing balance comment: relied on bil UEs for support.                    ADL Overall ADL's : Needs assistance/impaired                          Toilet Transfer: Minimal assistance;BSC;RW   Toileting- Clothing Manipulation and Hygiene: Moderate assistance;Sit to/from stand Toileting - Clothing Manipulation Details (indicate cue type and reason): Pt stood with BUE of rw while therapist completed pericare.     Functional mobility during ADLs: Min guard;Minimal assistance;Rolling walker General ADL Comments: Pivotal steps to [facility]. Pericare. Standing rest break then ambulated in room min guard to min A. son present for session.      Vision                 Additional Comments: pt is blind in R eye and has low vision in L   Perception     Praxis      Cognition   Behavior During Therapy: Flat affect Overall Cognitive Status: Within Functional Limits for tasks assessed                         Exercises     Shoulder Instructions       General Comments      Pertinent Vitals/ Pain       Pain Assessment: No/denies pain  Home Living                                          Prior Functioning/Environment  Frequency  Min 2X/week        Progress Toward Goals  OT Goals(current goals can now be found in the care plan section)  Progress towards OT goals: Progressing toward goals  Acute Rehab OT Goals Patient Stated Goal: to go home OT Goal Formulation: With patient Time For Goal Achievement: 11/08/16 Potential to Achieve Goals: Good  Plan Discharge plan remains appropriate    Co-evaluation                 End of Session Equipment Utilized During Treatment: Rolling walker;Gait belt  OT Visit Diagnosis: Unsteadiness on feet (R26.81);Muscle weakness (generalized) (M62.81);Low vision, both eyes (H54.2);Pain   Activity Tolerance Patient tolerated treatment well;Patient limited by fatigue   Patient Left in chair;with call bell/phone within reach;with family/visitor present;with chair alarm set   Nurse Communication           Time: [phone] OT Time Calculation (min): 27 min  Charges: OT General Charges $OT Visit: 1 Procedure OT Treatments $Self Care/Home Management : 23-37 mins     Hortencia Pilar 11/04/2016, 4:16 PM

## 2016-11-04 NOTE — Progress Notes (Signed)
PROGRESS NOTE    Brandy Huff  FKC:[id number] DOB: 1918-01-25 DOA: 10/30/2016 PCP: [name], MD    Brief Narrative:  83 y.o. 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 Started coughing on Sat/ Sun this past weekend, has gotten weaker and now cannot get up and walk with a walker which is her usual baseline.  Subjective fever, no chills, no CP, no abd pain, n/v/d.  No hx PNA, nonsmoker.  In ED, heart rate 102, RR 20, BP's normal.  CXR shows new bibasilar infiltrates and RML infiltrates.  Asked to see for admission.    Assessment & Plan:   Active Problems:   History of total colectomy - after colon Ca x 2   Community acquired pneumonia   Essential hypertension   Hypothyroidism   Volume depletion   Generalized weakness  1. CAP 1. Patient was initially continued on azithromycin and rocephin 2. Had been without leukocytosis 3. Patient remains afebrile 4. Patient presently on PO azithromycin plus vantin 5. Stable 2. HTN 1. Metoprolol and lisinopril are continued 2. Continue PRN IV hydralazine 3. BP has improved 3. Hypothyroid 1. Currently stable 2. Patient continued on levothyroxine as tolerated 4. Hx colectomy 1. presently stable 5. Generalized weakness 1. PT/OT consulted 2. Home health PT/OT/RN recommended 3. Likely secondary to dehydration with hyponatremia 6. Dehydration 1. Initially clinically dehydrated at admission 2. Clinically improved with IVF hydration 3. Mucus membranes no longer dry 7. Hyponatremia 1. Sodium with low of 126 2. Urine sodium elevated 3. Clinically no longer appears dehydrated 4. Pt reports voiding frequently 5. Will continue with fluid restriction 6. Sodium has remained stable at 129 7. Salt tabs have been added 8. Will repeat BMET in AM  DVT prophylaxis: Lovenox subQ Code Status: Full Family  Communication: Pt in room, family at bedside Disposition Plan: Uncertain at this time  Consultants:     Procedures:     Antimicrobials: Anti-infectives    Start     Dose/Rate Route Frequency Ordered Stop   11/02/16 2200  cefpodoxime (VANTIN) tablet 200 mg     200 mg Oral Every 12 hours 11/02/16 1713     11/01/16 2200  azithromycin (ZITHROMAX) tablet 500 mg     500 mg Oral Daily at bedtime 11/01/16 0806 11/05/16 2159   10/31/16 2000  azithromycin (ZITHROMAX) 500 mg in dextrose 5 % 250 mL IVPB  Status:  Discontinued     500 mg 250 mL/hr over 60 Minutes Intravenous Every 24 hours 10/30/16 2217 11/01/16 0806   10/31/16 1900  cefTRIAXone (ROCEPHIN) 1 g in dextrose 5 % 50 mL IVPB  Status:  Discontinued     1 g 100 mL/hr over 30 Minutes Intravenous Every 24 hours 10/30/16 2217 11/02/16 1713   10/30/16 1900  cefTRIAXone (ROCEPHIN) 1 g in dextrose 5 % 50 mL IVPB     1 g 100 mL/hr over 30 Minutes Intravenous  Once 10/30/16 1854 10/30/16 2047   10/30/16 1900  azithromycin (ZITHROMAX) 500 mg in dextrose 5 % 250 mL IVPB     500 mg 250 mL/hr over 60 Minutes Intravenous  Once 10/30/16 1854 10/30/16 2148      Subjective: No complaints at present  Objective: Vitals:   11/03/16 1330 11/03/16 2132 11/04/16 0527 11/04/16 1354  BP: (!) 145/79 (!) 163/90 (!) 167/91 (!) 146/86  Pulse: 93 92 99 97  Resp: 16 16 17 17   Temp: 97.9 F (36.6 C) 98.1 F (36.7 C) 98.1 F (36.7 C) 98.6 F (37 C)  TempSrc: Oral Oral Oral Oral  SpO2: 97% 95% 96% 96%    Intake/Output Summary (Last 24 hours) at 11/04/16 1752 Last data filed at 11/04/16 1354  Gross per 24 hour  Intake              560 ml  Output                0 ml  Net              560 ml   There were no vitals filed for this visit.  Examination:  General exam: conversant, awake, in nad Respiratory system: normal chest rise, no audible wheezing Cardiovascular system: regular rhythm, s1-2 on auscultation Gastrointestinal system:  nontender, pos BS, nondistended Central nervous system: no seizures, no tremors Extremities: no clubbing, no cyanosis Skin: normal skin turgor, no notable skin lesions seen Psychiatry: affect normal// no auditory hallucinations  Data Reviewed: I have personally reviewed following labs and imaging studies  CBC:  Recent Labs Lab 10/30/16 1732 10/31/16 0802  WBC 10.9* 9.7  NEUTROABS 8.1*  --   HGB 13.5 12.8  HCT 39.4 37.5  MCV 92.7 92.1  PLT 227 696   Basic Metabolic Panel:  Recent Labs Lab 10/31/16 0802 11/01/16 0502 11/02/16 0646 11/03/16 0601 11/04/16 0853  NA 127* 126* 128* 129* 129*  K 3.2* 3.0* 3.8 3.3* 4.3  CL 89* 90* 93* 95* 96*  CO2 26 25 25 24 23   GLUCOSE 116* 114* 123* 109* 130*  BUN 10 5* <5* 8 6  CREATININE 0.70 0.51 0.55 0.55 0.63  CALCIUM 8.6* 8.3* 8.8* 8.8* 9.0  MG  --   --  1.7  --   --    GFR: CrCl cannot be calculated (Unknown ideal weight.). Liver Function Tests:  Recent Labs Lab 10/30/16 1732  AST 30  ALT 19  ALKPHOS 80  BILITOT 1.1  PROT 6.7  ALBUMIN 2.9*   No results for input(s): LIPASE, AMYLASE in the last 168 hours. No results for input(s): AMMONIA in the last 168 hours. Coagulation Profile: No results for input(s): INR, PROTIME in the last 168 hours. Cardiac Enzymes: No results for input(s): CKTOTAL, CKMB, CKMBINDEX, TROPONINI in the last 168 hours. BNP (last 3 results) No results for input(s): PROBNP in the last 8760 hours. HbA1C: No results for input(s): HGBA1C in the last 72 hours. CBG: No results for input(s): GLUCAP in the last 168 hours. Lipid Profile: No results for input(s): CHOL, HDL, LDLCALC, TRIG, CHOLHDL, LDLDIRECT in the last 72 hours. Thyroid Function Tests: No results for input(s): TSH, T4TOTAL, FREET4, T3FREE, THYROIDAB in the last 72 hours. Anemia Panel: No results for input(s): VITAMINB12, FOLATE, FERRITIN, TIBC, IRON, RETICCTPCT in the last 72 hours. Sepsis Labs:  Recent Labs Lab 10/30/16 1755  10/30/16 2004  LATICACIDVEN 2.45* 1.35    Recent Results (from the past 240 hour(s))  Culture, blood (Routine X 2) w Reflex to ID Panel     Status: None   Collection Time: 10/30/16  7:59 PM  Result Value Ref Range Status   Specimen Description BLOOD LEFT ANTECUBITAL  Final   Special Requests BOTTLES DRAWN AEROBIC ONLY 5CC  Final   Culture NO GROWTH 5 DAYS  Final   Report Status 11/04/2016 FINAL  Final  Culture, blood (Routine X 2) w Reflex to ID Panel     Status:  None   Collection Time: 10/30/16  8:00 PM  Result Value Ref Range Status   Specimen Description BLOOD RIGHT ANTECUBITAL  Final   Special Requests BOTTLES DRAWN AEROBIC ONLY 5CC  Final   Culture NO GROWTH 5 DAYS  Final   Report Status 11/04/2016 FINAL  Final  Culture, sputum-assessment     Status: None   Collection Time: 10/31/16  3:51 PM  Result Value Ref Range Status   Specimen Description EXPECTORATED SPUTUM  Final   Special Requests NONE  Final   Sputum evaluation   Final    Sputum specimen not acceptable for testing.  Please recollect.   Naoma Diener RN [id number] 10/31/16 A BROWNING    Report Status 11/01/2016 FINAL  Final     Radiology Studies: No results found.  Scheduled Meds: . aspirin EC  325 mg Oral Daily  . azithromycin  500 mg Oral QHS  . cefpodoxime  200 mg Oral Q12H  . dorzolamide  1 drop Right Eye BID  . enoxaparin (LOVENOX) injection  40 mg Subcutaneous Q24H  . feeding supplement (ENSURE ENLIVE)  237 mL Oral BID BM  . levothyroxine  112 mcg Oral QAC breakfast  . lisinopril  10 mg Oral Daily  . loratadine  10 mg Oral Daily  . metoprolol succinate  25 mg Oral Daily  . sodium chloride  1 g Oral TID WC  . timolol  1 drop Right Eye BID   Continuous Infusions:    LOS: 5 days   CHIU, Orpah Melter, MD [facility] Pager [phone]  If 7PM-7AM, please contact night-coverage [email or web address] Password [facility] 11/04/2016, 5:52 PM

## 2016-11-05 LAB — BASIC METABOLIC PANEL
Anion gap: 10 (ref 5–15)
BUN: 8 mg/dL (ref 6–20)
CALCIUM: 8.9 mg/dL (ref 8.9–10.3)
CHLORIDE: 97 mmol/L — AB (ref 101–111)
CO2: 24 mmol/L (ref 22–32)
CREATININE: 0.6 mg/dL (ref 0.44–1.00)
GFR calc Af Amer: >60 mL/min (ref >60)
Glucose, Bld: 100 mg/dL — ABNORMAL HIGH (ref 65–99)
Potassium: 4.2 mmol/L (ref 3.5–5.1)
SODIUM: 131 mmol/L — AB (ref 135–145)

## 2016-11-05 MED ORDER — LISINOPRIL 10 MG PO TABS: 10.0000 mg | ORAL_TABLET | Freq: Every day | ORAL | 0 refills | Status: DC

## 2016-11-05 MED ORDER — SODIUM CHLORIDE 1 G PO TABS: 1.0000 g | ORAL_TABLET | Freq: Every day | ORAL | 0 refills | Status: DC

## 2016-11-05 MED ORDER — CEFPODOXIME PROXETIL 200 MG PO TABS: 200.0000 mg | ORAL_TABLET | Freq: Two times a day (BID) | ORAL | 0 refills | Status: DC

## 2016-11-05 NOTE — Progress Notes (Signed)
Physical Therapy Treatment Patient Details Name: Brandy Huff MRN: [id number] DOB: [birth year] Today's Date: 11/05/2016    History of Present Illness Brandy Huff is a 83 y.o. 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 Started coughing on Sat/ Brandy Huff this past weekend, and was too weak to use her RW to get around which is her baseline.     PT Comments    Patient is making good progress with PT.  Pt needs min guard for bed mobility and transfers and minA for ambulation with a RW mainly to improve safety due to decreased vision. From a mobility standpoint anticipate patient will be ready for DC home Pt will benefit from skilled PT to continued to improve LE strength and endurance to improve her ambulation to be safe in her home environment.      Follow Up Recommendations  Home health PT;Supervision/Assistance - 24 hour (HHOT)     Equipment Recommendations  None recommended by PT    Recommendations for Other Services       Precautions / Restrictions Precautions Precautions: Fall Restrictions Weight Bearing Restrictions: No    Mobility  Bed Mobility               General bed mobility comments: sitting in recliner  Transfers Overall transfer level: Needs assistance Equipment used: Rolling walker (2 wheeled) Transfers: Sit to/from [employer] Transfers Sit to Stand: Min guard         General transfer comment: min guard for better walker placement before getting up  Ambulation/Gait Ambulation/Gait assistance: Min assist   Assistive device: Rolling walker (2 wheeled) Gait Pattern/deviations: Narrow base of support;Trunk flexed;Step-to pattern;Decreased step length - right;Decreased step length - left;Shuffle Gait velocity: slow Gait velocity interpretation: Below normal speed for age/gender General Gait Details: vc for  staying within his walker and min A to reposition walker dure to decreased vision.       Balance Overall balance assessment: Needs assistance;History of Falls Sitting-balance support: No upper extremity supported;Feet supported Sitting balance-Leahy Scale: Fair     Standing balance support: Bilateral upper extremity supported;During functional activity Standing balance-Leahy Scale: Poor Standing balance comment: relied on bil UEs for support.                     Cognition Arousal/Alertness: Awake/alert Behavior During Therapy: WFL for tasks assessed/performed Overall Cognitive Status: Within Functional Limits for tasks assessed Area of Impairment: Memory;Safety/judgement;Following commands     Memory: Decreased recall of precautions Following Commands: Follows one step commands inconsistently Safety/Judgement: Decreased awareness of deficits   Problem Solving: Slow processing General Comments: slow to process vc for hand placement and moves with decreased safety awareness            Pertinent Vitals/Pain Pain Location: abdomen  Pain Descriptors / Indicators: Sore  VSS           PT Goals (current goals can now be found in the care plan section) Acute Rehab PT Goals Patient Stated Goal: to go home PT Goal Formulation: With patient Time For Goal Achievement: 11/14/16 Potential to Achieve Goals: Good Progress towards PT goals: Progressing toward goals    Frequency    Min 3X/week      PT Plan Current plan remains appropriate    Co-evaluation             End of  Session Equipment Utilized During Treatment: Gait belt Activity Tolerance: Patient tolerated treatment well Patient left: in chair;with call bell/phone within reach;with chair alarm set;with family/visitor present Nurse Communication: Mobility status PT Visit Diagnosis: Unsteadiness on feet (R26.81);Muscle weakness (generalized) (M62.81)     Time: 0518-3358 PT Time Calculation (min)  (ACUTE ONLY): 10 min  Charges:  $Gait Training: 8-22 mins                    G Codes:       Bailey Mech Fleet 11/05/2016, 2:54 PM  Dani Gobble. Migdalia Dk PT, DPT Acute Rehabilitation  [phone] Pager [phone]

## 2016-11-05 NOTE — Care Management Important Message (Signed)
Important Message  Patient Details  Name: Brandy Huff MRN: [id number] Date of Birth: 1918-06-26   Medicare Important Message Given:  Yes    [name] [name] 11/05/2016, 12:25 PM

## 2016-11-05 NOTE — Progress Notes (Signed)
Pt was feeling a lot better. Ambulating to the hall with walker, with PT. Discharge instructions given to pt's daughter, verbalized understanding.  Discharged to home accompanied by daughter.

## 2016-11-05 NOTE — Discharge Summary (Signed)
Physician Discharge Summary  Brandy Huff PPI:[id number] DOB: 06/05/1918 DOA: 10/30/2016  PCP: [name], MD  Admit date: 10/30/2016 Discharge date: 11/05/2016  Admitted From: Home Disposition:  Home  Recommendations for Outpatient Follow-up:  1. Follow up with PCP in 1 weeks 2. 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 Started coughing on Sat/ Sun this past weekend, has gotten weaker and now cannot get up and walk with a walker which is her usual baseline. Subjective fever, no chills, no CP, no abd pain, n/v/d. No hx PNA, nonsmoker. In ED, heart rate 102, RR 20, BP's normal. CXR shows new bibasilar infiltrates and RML infiltrates. Asked to see for admission.   1. CAP 1. Patient was initially continued on azithromycin and rocephin 2. Had been without leukocytosis 3. Patient remains afebrile 4. Patient to vantin on discharge. Already completed azithromycin 5. Stable 2. HTN 1. Metoprolol and lisinopril are continued 2. Continue PRN IV hydralazine 3. BP has improved 4. HCTZ was held with initial concerns of dehydration 3. Hypothyroid 1. Currently stable 2. Patient continued on levothyroxine as tolerated 4. Hx colectomy 1. presently stable 5. Generalized weakness 1. PT/OT consulted 2. Home health PT/OT/RN recommended 3. Likely secondary to dehydration with hyponatremia 6. Dehydration 1. Initially clinically dehydrated at admission 2. Clinically improved with IVF hydration 3. Mucus membranes no longer dry 7. Hyponatremia 1. Sodium with low of 126 2. Urine sodium elevated 3. Clinically no longer appears dehydrated 4. Pt reports voiding frequently 5. Sodium improved to 131 with sodium tab and  fluid restricted diet  Discharge Diagnoses:  Active Problems:   History of total colectomy - after colon Ca x 2   Community acquired pneumonia   Essential hypertension   Hypothyroidism   Volume depletion   Generalized weakness    Discharge Instructions   Allergies as of 11/05/2016   No Known Allergies     Medication List    STOP taking these medications   hydrochlorothiazide 25 MG tablet Commonly known as:  HYDRODIURIL   KLOR-CON M20 20 MEQ tablet Generic drug:  potassium chloride SA     TAKE these medications   aspirin EC 325 MG tablet Take 325 mg by mouth daily.   cefpodoxime 200 MG tablet Commonly known as:  VANTIN Take 1 tablet (200 mg total) by mouth every 12 (twelve) hours.   dextromethorphan-guaiFENesin 30-600 MG 12hr tablet Commonly known as:  MUCINEX DM Take 1 tablet by mouth 2 (two) times daily.   dorzolamide-timolol 22.3-6.8 MG/ML ophthalmic solution Commonly known as:  COSOPT Place 1 drop into the right eye 2 (two) times daily.   levothyroxine 112 MCG tablet Commonly known as:  SYNTHROID, LEVOTHROID Take 112 mcg by mouth daily before breakfast.   lisinopril 10 MG tablet Commonly known as:  PRINIVIL,ZESTRIL Take 1 tablet (10 mg total) by mouth daily. Start taking on:  11/06/2016 What changed:  medication strength  how much to take   loratadine 10 MG tablet Commonly known as:  CLARITIN Take 10 mg by mouth daily.   metoprolol succinate 25 MG 24 hr tablet Commonly known as:  TOPROL-XL Take 25 mg by mouth daily.   metroNIDAZOLE 1 % gel Commonly known as:  METROGEL Apply 1 application topically daily.   sodium chloride 1 g  tablet Take 1 tablet (1 g total) by mouth daily.      Follow-up Information    HUSAIN,KARRAR, MD. Schedule an appointment as soon as possible for a visit in 1 week(s).   Specialty:  Internal Medicine Contact information: [address]  [place] [address] [phone]          No Known  Allergies  Procedures/Studies: Dg Chest 2 View  Result Date: 10/30/2016 CLINICAL DATA:  83 y/o  F; productive cough, fever, and ileus. EXAM: CHEST  2 VIEW COMPARISON:  09/07/2011 chest radiograph. FINDINGS: Right middle and right greater than left lower lobe consolidations. Small right and trace left pleural effusions. Stable cardiac silhouette given projection and technique. Aortic atherosclerosis with calcification. 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 Right maxillary and left upper quadrant surgical clips. IMPRESSION: Right middle and right greater than left lower lobe pneumonia with small effusions. These results will be called to the ordering clinician or representative by the Radiologist Assistant, and communication documented in the PACS or zVision Dashboard. Electronically Signed   By: Kristine Garbe M.D.   On: 10/30/2016 18:31    Subjective: Eager to go home  Discharge Exam: Vitals:   11/04/16 2159 11/05/16 0524  BP: (!) 145/70 135/87  Pulse: 99 99  Resp: 19 19  Temp: 97.7 F (36.5 C) 98 F (36.7 C)   Vitals:   11/04/16 0527 11/04/16 1354 11/04/16 2159 11/05/16 0524  BP: (!) 167/91 (!) 146/86 (!) 145/70 135/87  Pulse: 99 97 99 99  Resp: 17 17 19 19   Temp: 98.1 F (36.7 C) 98.6 F (37 C) 97.7 F (36.5 C) 98 F (36.7 C)  TempSrc: Oral Oral Oral Oral  SpO2: 96% 96% 97% 98%    General: Pt is alert, awake, not in acute distress Cardiovascular: RRR, S1/S2 +, no rubs, no gallops Respiratory: CTA bilaterally, no wheezing, no rhonchi Abdominal: Soft, NT, ND, bowel sounds + Extremities: no edema, no cyanosis   The results of significant diagnostics from this hospitalization (including imaging, microbiology, ancillary and laboratory) are listed below for reference.     Microbiology: Recent Results (from the past 240 hour(s))  Culture, blood (Routine X 2) w Reflex to ID Panel     Status: None   Collection Time: 10/30/16  7:59 PM  Result Value Ref  Range Status   Specimen Description BLOOD LEFT ANTECUBITAL  Final   Special Requests BOTTLES DRAWN AEROBIC ONLY 5CC  Final   Culture NO GROWTH 5 DAYS  Final   Report Status 11/04/2016 FINAL  Final  Culture, blood (Routine X 2) w Reflex to ID Panel     Status: None   Collection Time: 10/30/16  8:00 PM  Result Value Ref Range Status   Specimen Description BLOOD RIGHT ANTECUBITAL  Final   Special Requests BOTTLES DRAWN AEROBIC ONLY 5CC  Final   Culture NO GROWTH 5 DAYS  Final   Report Status 11/04/2016 FINAL  Final  Culture, sputum-assessment     Status: None   Collection Time: 10/31/16  3:51 PM  Result Value Ref Range Status   Specimen Description EXPECTORATED SPUTUM  Final   Special Requests NONE  Final   Sputum evaluation   Final    Sputum specimen not acceptable for testing.  Please recollect.   Naoma Diener RN 2500 10/31/16 A BROWNING    Report Status 11/01/2016 FINAL  Final     Labs: BNP (last 3 results) No results for input(s): BNP in the  last 8760 hours. Basic Metabolic Panel:  Recent Labs Lab 11/01/16 0502 11/02/16 0646 11/03/16 0601 11/04/16 0853 11/05/16 0552  NA 126* 128* 129* 129* 131*  K 3.0* 3.8 3.3* 4.3 4.2  CL 90* 93* 95* 96* 97*  CO2 25 25 24 23 24   GLUCOSE 114* 123* 109* 130* 100*  BUN 5* <5* 8 6 8   CREATININE 0.51 0.55 0.55 0.63 0.60  CALCIUM 8.3* 8.8* 8.8* 9.0 8.9  MG  --  1.7  --   --   --    Liver Function Tests:  Recent Labs Lab 10/30/16 1732  AST 30  ALT 19  ALKPHOS 80  BILITOT 1.1  PROT 6.7  ALBUMIN 2.9*   No results for input(s): LIPASE, AMYLASE in the last 168 hours. No results for input(s): AMMONIA in the last 168 hours. CBC:  Recent Labs Lab 10/30/16 1732 10/31/16 0802  WBC 10.9* 9.7  NEUTROABS 8.1*  --   HGB 13.5 12.8  HCT 39.4 37.5  MCV 92.7 92.1  PLT 227 209   Cardiac Enzymes: No results for input(s): CKTOTAL, CKMB, CKMBINDEX, TROPONINI in the last 168 hours. BNP: Invalid input(s): POCBNP CBG: No results  for input(s): GLUCAP in the last 168 hours. D-Dimer No results for input(s): DDIMER in the last 72 hours. Hgb A1c No results for input(s): HGBA1C in the last 72 hours. Lipid Profile No results for input(s): CHOL, HDL, LDLCALC, TRIG, CHOLHDL, LDLDIRECT in the last 72 hours. Thyroid function studies No results for input(s): TSH, T4TOTAL, T3FREE, THYROIDAB in the last 72 hours.  Invalid input(s): FREET3 Anemia work up No results for input(s): VITAMINB12, FOLATE, FERRITIN, TIBC, IRON, RETICCTPCT in the last 72 hours. Urinalysis    Component Value Date/Time   COLORURINE YELLOW 10/30/2016 2031   APPEARANCEUR HAZY (A) 10/30/2016 2031   LABSPEC 1.017 10/30/2016 2031   PHURINE 5.0 10/30/2016 2031   GLUCOSEU NEGATIVE 10/30/2016 2031   HGBUR MODERATE (A) 10/30/2016 2031   BILIRUBINUR NEGATIVE 10/30/2016 2031   KETONESUR NEGATIVE 10/30/2016 2031   PROTEINUR NEGATIVE 10/30/2016 2031   UROBILINOGEN 0.2 09/09/2016 1811   NITRITE POSITIVE (A) 10/30/2016 2031   LEUKOCYTESUR SMALL (A) 10/30/2016 2031   Sepsis Labs Invalid input(s): PROCALCITONIN,  WBC,  LACTICIDVEN Microbiology Recent Results (from the past 240 hour(s))  Culture, blood (Routine X 2) w Reflex to ID Panel     Status: None   Collection Time: 10/30/16  7:59 PM  Result Value Ref Range Status   Specimen Description BLOOD LEFT ANTECUBITAL  Final   Special Requests BOTTLES DRAWN AEROBIC ONLY 5CC  Final   Culture NO GROWTH 5 DAYS  Final   Report Status 11/04/2016 FINAL  Final  Culture, blood (Routine X 2) w Reflex to ID Panel     Status: None   Collection Time: 10/30/16  8:00 PM  Result Value Ref Range Status   Specimen Description BLOOD RIGHT ANTECUBITAL  Final   Special Requests BOTTLES DRAWN AEROBIC ONLY 5CC  Final   Culture NO GROWTH 5 DAYS  Final   Report Status 11/04/2016 FINAL  Final  Culture, sputum-assessment     Status: None   Collection Time: 10/31/16  3:51 PM  Result Value Ref Range Status   Specimen Description  EXPECTORATED SPUTUM  Final   Special Requests NONE  Final   Sputum evaluation   Final    Sputum specimen not acceptable for testing.  Please recollect.   Naoma Diener RN 1610 10/31/16 A BROWNING    Report Status  11/01/2016 FINAL  Final     SIGNED:   Donne Hazel, MD  [facility] 11/05/2016, 12:41 PM  If 7PM-7AM, please contact night-coverage [email or web address] Password [email or web address]

## 2016-12-05 ENCOUNTER — Other Ambulatory Visit: Payer: Self-pay | Admitting: Internal Medicine

## 2016-12-05 ENCOUNTER — Ambulatory Visit (INDEPENDENT_AMBULATORY_CARE_PROVIDER_SITE_OTHER): Payer: Medicare Other | Admitting: Podiatry

## 2016-12-05 ENCOUNTER — Ambulatory Visit
Admission: RE | Admit: 2016-12-05 | Discharge: 2016-12-05 | Disposition: A | Discharge status: Home / Self Care | Payer: Medicare Other | Source: Ambulatory Visit | Attending: Internal Medicine | Admitting: Internal Medicine

## 2016-12-05 DIAGNOSIS — M79674 Pain in right toe(s): Secondary | ICD-10-CM

## 2016-12-05 DIAGNOSIS — J189 Pneumonia, unspecified organism: Secondary | ICD-10-CM

## 2016-12-05 DIAGNOSIS — B351 Tinea unguium: Secondary | ICD-10-CM

## 2016-12-05 DIAGNOSIS — M79675 Pain in left toe(s): Secondary | ICD-10-CM

## 2016-12-05 NOTE — Progress Notes (Signed)
Patient ID: Brandy Huff, female   DOB: 25-Mar-1918, 83 y.o.   MRN: [id number]   Subjective: This patient presents today with her daughter present in the treatment room. Her daughter is requesting debridement of her mother's toenails stating that they are elongated and uncomfortable when she wears closed shoes  Objective: Patient is responsive and appears orientated 3  Pitting edema bilaterally DP and PT pulses 1/4 bilaterally Capillary reflex immediate bilaterally Sensation to 10 g monofilament wire intact 5/5 bilaterally Vibratory sensation nonreactive bilaterally Ankle reflexes reactive bilaterally HAV deformities bilaterally Patient walks slowly with a walker Manual motor testing dorsi flexion, plantar flexion 5/5 bilaterally No open skin lesions bilaterally Atrophic skin with absent hair growth bilaterally The toenails are elongated, hypertrophic, discolored, deformed and tender direct palpation 6-10  Assessment: Symptomatic onychomycoses 6-10 Decreased pedal pulses bilaterally  Plan: Debridement of toenails 6-10 mechanically and electrically without any bleeding  Reappoint 3 months

## 2016-12-07 ENCOUNTER — Other Ambulatory Visit (HOSPITAL_COMMUNITY): Payer: Self-pay | Admitting: Internal Medicine

## 2016-12-07 DIAGNOSIS — R059 Cough, unspecified: Secondary | ICD-10-CM

## 2016-12-07 DIAGNOSIS — R05 Cough: Secondary | ICD-10-CM

## 2016-12-07 DIAGNOSIS — J189 Pneumonia, unspecified organism: Secondary | ICD-10-CM

## 2016-12-07 DIAGNOSIS — J810 Acute pulmonary edema: Secondary | ICD-10-CM

## 2016-12-12 ENCOUNTER — Other Ambulatory Visit (HOSPITAL_COMMUNITY): Payer: Self-pay | Admitting: Internal Medicine

## 2016-12-12 ENCOUNTER — Ambulatory Visit (HOSPITAL_COMMUNITY)
Admission: RE | Admit: 2016-12-12 | Discharge: 2016-12-12 | Disposition: A | Discharge status: Home / Self Care | Payer: Medicare Other | Source: Ambulatory Visit | Attending: Internal Medicine | Admitting: Internal Medicine

## 2016-12-12 DIAGNOSIS — R059 Cough, unspecified: Secondary | ICD-10-CM

## 2016-12-12 DIAGNOSIS — J810 Acute pulmonary edema: Secondary | ICD-10-CM

## 2016-12-12 DIAGNOSIS — Z8701 Personal history of pneumonia (recurrent): Secondary | ICD-10-CM | POA: Insufficient documentation

## 2016-12-12 DIAGNOSIS — R05 Cough: Secondary | ICD-10-CM

## 2016-12-12 DIAGNOSIS — Z5309 Procedure and treatment not carried out because of other contraindication: Secondary | ICD-10-CM | POA: Diagnosis not present

## 2016-12-12 DIAGNOSIS — J189 Pneumonia, unspecified organism: Secondary | ICD-10-CM

## 2016-12-12 DIAGNOSIS — J90 Pleural effusion, not elsewhere classified: Secondary | ICD-10-CM | POA: Insufficient documentation

## 2016-12-12 MED ORDER — LIDOCAINE HCL 1 % IJ SOLN
INTRAMUSCULAR | Status: AC
  Filled 2016-12-12: qty 20

## 2016-12-12 NOTE — Progress Notes (Signed)
Patient presented today for a left-sided thoracentesis secondary to findings of an effusion on recent CXR.  Today upon arrival, her BP was 210/110.  A repeat manual BP was 198/110.  Dr. Lysle Rubens was contacted and informed of these blood pressures.  He recommended we give her 15 minutes to relax and try to retake her BP.  After 15 minutes her BP remained 190/110 manually.  The patient denies SOB.  She is comfortable and sating well on room air.  Upon evaluation of her left chest with [place], she actually does not have much fluid but more lung consolidation.  After discussion with Dr. Pascal Lux, it was felt given the small amount of fluid and the state of her BP, we held off on proceeding with the thoracentesis.  This was discussed and explained to the patient and her daughter.  Both understand and agree with the plan.  Dr. Lysle Rubens has asked that the patient follow up with him within the next week to discuss her BP.  This was also relayed to the daughter who expressed understanding.  Maureena Dabbs E 2:26 PM 12/12/2016

## 2016-12-17 ENCOUNTER — Other Ambulatory Visit: Payer: Self-pay | Admitting: Internal Medicine

## 2016-12-17 ENCOUNTER — Ambulatory Visit
Admission: RE | Admit: 2016-12-17 | Discharge: 2016-12-17 | Disposition: A | Discharge status: Home / Self Care | Payer: Medicare Other | Source: Ambulatory Visit | Attending: Internal Medicine | Admitting: Internal Medicine

## 2016-12-17 DIAGNOSIS — J90 Pleural effusion, not elsewhere classified: Secondary | ICD-10-CM

## 2016-12-21 ENCOUNTER — Emergency Department (HOSPITAL_COMMUNITY): Payer: Medicare Other

## 2016-12-21 ENCOUNTER — Inpatient Hospital Stay (HOSPITAL_COMMUNITY)
Admission: EM | Admit: 2016-12-21 | Discharge: 2016-12-24 | DRG: 092 | Disposition: A | Discharge status: Home Health | Payer: Medicare Other | Attending: Internal Medicine | Admitting: Internal Medicine

## 2016-12-21 ENCOUNTER — Inpatient Hospital Stay (HOSPITAL_COMMUNITY): Payer: Medicare Other

## 2016-12-21 ENCOUNTER — Encounter (HOSPITAL_COMMUNITY): Payer: Self-pay

## 2016-12-21 DIAGNOSIS — Z853 Personal history of malignant neoplasm of breast: Secondary | ICD-10-CM

## 2016-12-21 DIAGNOSIS — F17220 Nicotine dependence, chewing tobacco, uncomplicated: Secondary | ICD-10-CM | POA: Diagnosis present

## 2016-12-21 DIAGNOSIS — G9340 Encephalopathy, unspecified: Secondary | ICD-10-CM | POA: Diagnosis not present

## 2016-12-21 DIAGNOSIS — Z8249 Family history of ischemic heart disease and other diseases of the circulatory system: Secondary | ICD-10-CM | POA: Diagnosis not present

## 2016-12-21 DIAGNOSIS — I480 Paroxysmal atrial fibrillation: Secondary | ICD-10-CM | POA: Diagnosis present

## 2016-12-21 DIAGNOSIS — E669 Obesity, unspecified: Secondary | ICD-10-CM | POA: Diagnosis present

## 2016-12-21 DIAGNOSIS — Z833 Family history of diabetes mellitus: Secondary | ICD-10-CM

## 2016-12-21 DIAGNOSIS — E039 Hypothyroidism, unspecified: Secondary | ICD-10-CM | POA: Diagnosis present

## 2016-12-21 DIAGNOSIS — E871 Hypo-osmolality and hyponatremia: Secondary | ICD-10-CM | POA: Diagnosis present

## 2016-12-21 DIAGNOSIS — Z87440 Personal history of urinary (tract) infections: Secondary | ICD-10-CM

## 2016-12-21 DIAGNOSIS — E876 Hypokalemia: Secondary | ICD-10-CM | POA: Diagnosis present

## 2016-12-21 DIAGNOSIS — Z8701 Personal history of pneumonia (recurrent): Secondary | ICD-10-CM | POA: Diagnosis not present

## 2016-12-21 DIAGNOSIS — Z9049 Acquired absence of other specified parts of digestive tract: Secondary | ICD-10-CM

## 2016-12-21 DIAGNOSIS — Z96653 Presence of artificial knee joint, bilateral: Secondary | ICD-10-CM | POA: Diagnosis present

## 2016-12-21 DIAGNOSIS — J189 Pneumonia, unspecified organism: Secondary | ICD-10-CM | POA: Diagnosis present

## 2016-12-21 DIAGNOSIS — W19XXXA Unspecified fall, initial encounter: Secondary | ICD-10-CM

## 2016-12-21 DIAGNOSIS — G92 Toxic encephalopathy: Secondary | ICD-10-CM | POA: Diagnosis present

## 2016-12-21 DIAGNOSIS — F0390 Unspecified dementia without behavioral disturbance: Secondary | ICD-10-CM | POA: Diagnosis present

## 2016-12-21 DIAGNOSIS — R410 Disorientation, unspecified: Secondary | ICD-10-CM | POA: Diagnosis not present

## 2016-12-21 DIAGNOSIS — Z85038 Personal history of other malignant neoplasm of large intestine: Secondary | ICD-10-CM

## 2016-12-21 DIAGNOSIS — I10 Essential (primary) hypertension: Secondary | ICD-10-CM | POA: Diagnosis present

## 2016-12-21 DIAGNOSIS — T368X5A Adverse effect of other systemic antibiotics, initial encounter: Secondary | ICD-10-CM | POA: Diagnosis present

## 2016-12-21 DIAGNOSIS — Z90710 Acquired absence of both cervix and uterus: Secondary | ICD-10-CM | POA: Diagnosis not present

## 2016-12-21 DIAGNOSIS — R296 Repeated falls: Secondary | ICD-10-CM

## 2016-12-21 DIAGNOSIS — J90 Pleural effusion, not elsewhere classified: Secondary | ICD-10-CM | POA: Diagnosis present

## 2016-12-21 DIAGNOSIS — Z6826 Body mass index (BMI) 26.0-26.9, adult: Secondary | ICD-10-CM

## 2016-12-21 DIAGNOSIS — Z9011 Acquired absence of right breast and nipple: Secondary | ICD-10-CM

## 2016-12-21 DIAGNOSIS — I359 Nonrheumatic aortic valve disorder, unspecified: Secondary | ICD-10-CM | POA: Diagnosis not present

## 2016-12-21 DIAGNOSIS — E860 Dehydration: Secondary | ICD-10-CM | POA: Diagnosis present

## 2016-12-21 DIAGNOSIS — J918 Pleural effusion in other conditions classified elsewhere: Secondary | ICD-10-CM

## 2016-12-21 LAB — CREATININE, SERUM
Creatinine, Ser: 0.69 mg/dL (ref 0.44–1.00)
GFR calc Af Amer: >60 mL/min (ref >60)
GFR calc non Af Amer: >60 mL/min (ref >60)

## 2016-12-21 LAB — CBC
HCT: 42 % (ref 36.0–46.0)
HEMATOCRIT: 41.7 % (ref 36.0–46.0)
HEMOGLOBIN: 14.4 g/dL (ref 12.0–15.0)
HEMOGLOBIN: 14.1 g/dL (ref 12.0–15.0)
MCH: 31.6 pg (ref 26.0–34.0)
MCH: 31.3 pg (ref 26.0–34.0)
MCHC: 34.3 g/dL (ref 30.0–36.0)
MCHC: 33.8 g/dL (ref 30.0–36.0)
MCV: 92.1 fL (ref 78.0–100.0)
MCV: 92.5 fL (ref 78.0–100.0)
Platelets: 182 10*3/uL (ref 150–400)
Platelets: 214 10*3/uL (ref 150–400)
RBC: 4.56 MIL/uL (ref 3.87–5.11)
RBC: 4.51 MIL/uL (ref 3.87–5.11)
RDW: 14.2 % (ref 11.5–15.5)
RDW: 14.3 % (ref 11.5–15.5)
WBC: 6.4 10*3/uL (ref 4.0–10.5)
WBC: 8.8 10*3/uL (ref 4.0–10.5)

## 2016-12-21 LAB — URINALYSIS, ROUTINE W REFLEX MICROSCOPIC
Bilirubin Urine: NEGATIVE
Glucose, UA: 50 mg/dL — AB
HGB URINE DIPSTICK: NEGATIVE
KETONES UR: NEGATIVE mg/dL
LEUKOCYTES UA: NEGATIVE
Nitrite: NEGATIVE
Protein, ur: NEGATIVE mg/dL
Specific Gravity, Urine: 1.008 (ref 1.005–1.030)
pH: 8 (ref 5.0–8.0)

## 2016-12-21 LAB — COMPREHENSIVE METABOLIC PANEL
ALK PHOS: 61 U/L (ref 38–126)
ALT: 11 U/L — ABNORMAL LOW (ref 14–54)
ANION GAP: 10 (ref 5–15)
AST: 20 U/L (ref 15–41)
Albumin: 3.6 g/dL (ref 3.5–5.0)
BILIRUBIN TOTAL: 1.6 mg/dL — AB (ref 0.3–1.2)
BUN: 12 mg/dL (ref 6–20)
CALCIUM: 9.3 mg/dL (ref 8.9–10.3)
CO2: 23 mmol/L (ref 22–32)
Chloride: 96 mmol/L — ABNORMAL LOW (ref 101–111)
Creatinine, Ser: 0.75 mg/dL (ref 0.44–1.00)
GFR calc non Af Amer: >60 mL/min (ref >60)
Glucose, Bld: 114 mg/dL — ABNORMAL HIGH (ref 65–99)
Potassium: 3.2 mmol/L — ABNORMAL LOW (ref 3.5–5.1)
Sodium: 129 mmol/L — ABNORMAL LOW (ref 135–145)
Total Protein: 6.6 g/dL (ref 6.5–8.1)

## 2016-12-21 LAB — I-STAT CG4 LACTIC ACID, ED
LACTIC ACID, VENOUS: 1.27 mmol/L (ref 0.5–1.9)
LACTIC ACID, VENOUS: 1.62 mmol/L (ref 0.5–1.9)

## 2016-12-21 LAB — MAGNESIUM: MAGNESIUM: 1.7 mg/dL (ref 1.7–2.4)

## 2016-12-21 MED ORDER — METOPROLOL SUCCINATE ER 50 MG PO TB24
50.0000 mg | ORAL_TABLET | Freq: Every day | ORAL | Status: DC
  Administered 2016-12-21 – 2016-12-24 (×4): 50 mg via ORAL
  Filled 2016-12-21 (×4): qty 1

## 2016-12-21 MED ORDER — VANCOMYCIN HCL IN DEXTROSE 1-5 GM/200ML-% IV SOLN
1000.0000 mg | Freq: Once | INTRAVENOUS | Status: AC
  Administered 2016-12-21: 1000 mg via INTRAVENOUS
  Filled 2016-12-21 (×2): qty 200

## 2016-12-21 MED ORDER — CEFEPIME HCL 1 G IJ SOLR
1.0000 g | Freq: Once | INTRAMUSCULAR | Status: AC
  Administered 2016-12-21: 1 g via INTRAVENOUS
  Filled 2016-12-21: qty 1

## 2016-12-21 MED ORDER — VANCOMYCIN HCL 500 MG IV SOLR
500.0000 mg | INTRAVENOUS | Status: DC
  Administered 2016-12-22: 500 mg via INTRAVENOUS
  Filled 2016-12-21: qty 500

## 2016-12-21 MED ORDER — ONDANSETRON HCL 4 MG PO TABS
4.0000 mg | ORAL_TABLET | Freq: Four times a day (QID) | ORAL | Status: DC | PRN
Start: 2016-12-21 — End: 2016-12-24

## 2016-12-21 MED ORDER — TRAZODONE HCL 50 MG PO TABS: 25.0000 mg | ORAL_TABLET | Freq: Every evening | ORAL | Status: DC | PRN

## 2016-12-21 MED ORDER — ACETAMINOPHEN 325 MG PO TABS
650.0000 mg | ORAL_TABLET | Freq: Once | ORAL | Status: AC
  Administered 2016-12-21: 650 mg via ORAL
  Filled 2016-12-21: qty 2

## 2016-12-21 MED ORDER — SODIUM CHLORIDE 0.9% FLUSH
3.0000 mL | Freq: Two times a day (BID) | INTRAVENOUS | Status: DC
  Administered 2016-12-21 – 2016-12-24 (×2): 3 mL via INTRAVENOUS

## 2016-12-21 MED ORDER — IRBESARTAN 150 MG PO TABS
150.0000 mg | ORAL_TABLET | Freq: Every day | ORAL | Status: DC
  Administered 2016-12-21 – 2016-12-24 (×4): 150 mg via ORAL
  Filled 2016-12-21 (×5): qty 1

## 2016-12-21 MED ORDER — ACETAMINOPHEN 325 MG PO TABS: 650.0000 mg | ORAL_TABLET | Freq: Four times a day (QID) | ORAL | Status: DC | PRN

## 2016-12-21 MED ORDER — METOPROLOL SUCCINATE ER 25 MG PO TB24: 25.0000 mg | ORAL_TABLET | Freq: Every day | ORAL | Status: DC

## 2016-12-21 MED ORDER — ENOXAPARIN SODIUM 40 MG/0.4ML ~~LOC~~ SOLN
40.0000 mg | SUBCUTANEOUS | Status: DC
  Administered 2016-12-21 – 2016-12-24 (×4): 40 mg via SUBCUTANEOUS
  Filled 2016-12-21 (×4): qty 0.4

## 2016-12-21 MED ORDER — POTASSIUM CHLORIDE CRYS ER 20 MEQ PO TBCR
30.0000 meq | EXTENDED_RELEASE_TABLET | Freq: Two times a day (BID) | ORAL | Status: AC
  Administered 2016-12-21 (×2): 30 meq via ORAL
  Filled 2016-12-21 (×2): qty 1

## 2016-12-21 MED ORDER — ONDANSETRON HCL 4 MG/2ML IJ SOLN: 4.0000 mg | Freq: Four times a day (QID) | INTRAMUSCULAR | Status: DC | PRN

## 2016-12-21 MED ORDER — IOPAMIDOL (ISOVUE-300) INJECTION 61%
INTRAVENOUS | Status: AC
  Administered 2016-12-21: 75 mL
  Filled 2016-12-21: qty 75

## 2016-12-21 MED ORDER — IRBESARTAN 300 MG PO TABS
150.0000 mg | ORAL_TABLET | Freq: Every day | ORAL | Status: DC
  Administered 2016-12-21: 150 mg via ORAL
  Filled 2016-12-21: qty 1

## 2016-12-21 MED ORDER — METOPROLOL SUCCINATE ER 25 MG PO TB24
25.0000 mg | ORAL_TABLET | Freq: Every day | ORAL | Status: DC
  Administered 2016-12-21: 25 mg via ORAL
  Filled 2016-12-21: qty 1

## 2016-12-21 MED ORDER — LEVOTHYROXINE SODIUM 112 MCG PO TABS
112.0000 ug | ORAL_TABLET | Freq: Every day | ORAL | Status: DC
  Administered 2016-12-22 – 2016-12-24 (×3): 112 ug via ORAL
  Filled 2016-12-21 (×3): qty 1

## 2016-12-21 MED ORDER — HYDRALAZINE HCL 20 MG/ML IJ SOLN
10.0000 mg | Freq: Three times a day (TID) | INTRAMUSCULAR | Status: DC | PRN
  Administered 2016-12-21: 10 mg via INTRAVENOUS
  Filled 2016-12-21: qty 1

## 2016-12-21 MED ORDER — ACETAMINOPHEN 650 MG RE SUPP: 650.0000 mg | Freq: Four times a day (QID) | RECTAL | Status: DC | PRN

## 2016-12-21 MED ORDER — SODIUM CHLORIDE 0.9 % IV SOLN
INTRAVENOUS | Status: DC
  Administered 2016-12-21 – 2016-12-22 (×2): via INTRAVENOUS

## 2016-12-21 NOTE — H&P (Signed)
History and Physical    Brandy Huff ZOX:[id number] DOB: 1917/12/14 DOA: 12/21/2016  PCP: [name], MD   Patient coming from: Home  Chief Complaint: Confusion  HPI: Brandy Huff is a 83 y.o. female with medical history significant of atrial fibrillation-not on anticoagulation therapy, hypertension, arthritis, peripheral vascular disease with right carotid artery occlusion and right eye blindness, breast cancer status post right sided mastectomy, colon cancer status post complete colectomy and history of chemotherapy in the past recent admission with pneumonia who presented to the ED this morning with complaints of confusion. Patient was seen after discharge from the hospital by PCP and scheduled for a chest x-ray to follow up with resolution of pneumonia. The x-ray revealed left-sided pleural effusion and on 12/12/2016  was scheduled for guided thoracentesis. However, upon arrival patient had significantly elevated blood pressure 210/110 mmHg and ultrasound revealed not much fluid and no lung consolidation. Patient was satting well on room air and given her elevated blood pressure and small amount of fluid they opted not to proceed with thoracentesis. Dr. Deforest Hoyles was notified and seen patient in follow-up. He reported that 2 days ago she was placed on Levaquin. After the first dose of Levaquin in the evening patient started having auditory and visual hallucinations she was very agitated throughout the night, but closer to the morning patient's mentation cleared and she was feeling well throughout the day. Last night her daughter noted again signs of agitation and the mother was unable to sleep the whole night, sitting up in a chair and talking with her daughter. In the time the daughter was trying to place her to bed she was started belching and was unable to keep supine position. Eventually around 3 AM this night she went to bed but soon after her daughter heard her screaming and the wound  when she walked into the room her mother was literally jumping off the bed and speech was gibberish and nobody was able to understand what she was saying, she looked very scared and agitated. At this point family contacted EMS and patient was transferred to the ED [facility].  During my assessment patient denied chest pain, shortness of breath, but she still had some burping.  ED Course: Upon arrival to the ED patient was noted to be tachycardic, to keep anything, and blood pressure was well controlled initially her pressure was 174/135 mmHg and increased to 205/91 mmHg. Blood work demonstrated low sodium 129, low potassium 3.2, low chloride 96, chest x-ray demonstrated left lower lobe airspace consolidation with left effusion. UA was not suggestive of UTI   Review of Systems: As per HPI otherwise 10 point review of systems negative.   Ambulatory Status: Uses walker at base line  Past Medical History:  Diagnosis Date  . Arthritis   . Atrial fibrillation ([place])   . Cancer ([place])    Breast  . Cancer ([place])    Colon  . Cancer (HCC)    Skin  . Carotid artery occlusion   . Dermatophytosis of nail   . Fall from slipping Nov. 2013  . Hypertension   . PONV (postoperative nausea and vomiting)   . Thyroid disease     Past Surgical History:  Procedure Laterality Date  . ABDOMINAL HYSTERECTOMY    . APPENDECTOMY    . CAROTID ENDARTERECTOMY  2012   right  . CATARACT EXTRACTION     bilateral  . COLON SURGERY     Colectomy X 2  . ENDARTERECTOMY  02/08/2012  Procedure: ENDARTERECTOMY CAROTID;  Surgeon: Serafina Mitchell, MD;  Location: [facility] OR;  Service: Vascular;  Laterality: Left;  and Patch Angioplasty  . HERNIA REPAIR     Left Ventral hernia  . JOINT REPLACEMENT  2005   Bilateral knee replacements  . MASTECTOMY     Right Breast    Social History   Social History  . Marital status: Widowed    Spouse name: N/A  . Number of children: N/A  . Years of education: N/A    Occupational History  . Not on file.   Social History Main Topics  . Smoking status: Never Smoker  . Smokeless tobacco: Current User    Types: Snuff  . Alcohol use No  . Drug use: No  . Sexual activity: Not on file   Other Topics Concern  . Not on file   Social History Narrative  . No narrative on file    Allergies  Allergen Reactions  . Levaquin [Levofloxacin In D5w] Other (See Comments)    Altered Mental Status    Family History  Problem Relation Age of Onset  . Heart murmur Sister   . Heart disease Mother   . Cancer Daughter   . Hyperlipidemia Daughter   . Cancer Son   . Diabetes Son   . Hyperlipidemia Son   . Hypertension Son   . Heart attack Son   . Deep vein thrombosis Son     Prior to Admission medications   Medication Sig Start Date End Date Taking? Authorizing Provider  levofloxacin (LEVAQUIN) 500 MG tablet Take 500 mg by mouth daily.   Yes Historical Provider, MD  levothyroxine (SYNTHROID, LEVOTHROID) 112 MCG tablet Take 112 mcg by mouth daily before breakfast.   Yes Historical Provider, MD  metoprolol succinate (TOPROL-XL) 25 MG 24 hr tablet Take 25 mg by mouth daily.   Yes Historical Provider, MD  valsartan (DIOVAN) 160 MG tablet Take 160 mg by mouth daily.  11/13/16  Yes Historical Provider, MD  cefpodoxime (VANTIN) 200 MG tablet Take 1 tablet (200 mg total) by mouth every 12 (twelve) hours. Patient not taking: Reported on 12/21/2016 11/05/16   Donne Hazel, MD  lisinopril (PRINIVIL,ZESTRIL) 10 MG tablet Take 1 tablet (10 mg total) by mouth daily. Patient not taking: Reported on 12/21/2016 11/06/16   Donne Hazel, MD  sodium chloride 1 g tablet Take 1 tablet (1 g total) by mouth daily. Patient not taking: Reported on 12/21/2016 11/05/16   Donne Hazel, MD    Physical Exam: Vitals:   12/21/16 0635 12/21/16 0730 12/21/16 0830 12/21/16 1000  BP: (!) 174/135 (!) 205/91 (!) 190/89 (!) 177/92  Pulse: (!) 106 (!) 101 98 90  Resp: 19 (!) 21 19 (!) 26   Temp: 97.7 F (36.5 C)     TempSrc: Oral     SpO2: 98% 98% 100% 100%     General: Appears calm and comfortable Eyes: PERRLA, EOMI, normal lids, iris ENT:  grossly normal hearing, lips & tongue, mucous membranes moist and intact Neck: no lymphoadenopathy, masses or thyromegaly Cardiovascular: RRR, no m/r/g. No JVD, carotid bruits. Bilateral 1(+) non pitting ankle edema.  Respiratory: bilateral no wheezes, rales, rhonchi, basilar cackles on the right and diminished BS on the left side half way down. Normal respiratory effort. No accessory muscle use observed Abdomen: soft, non-tender, non-distended, no organomegaly or masses appreciated. BS present in all quadrants Skin: no rash, ulcers or induration seen on limited exam Musculoskeletal: grossly normal tone BUE/BLE,  good ROM, no bony abnormality or joint deformities observed Psychiatric: grossly normal mood and affect, speech fluent and appropriate, alert and oriented x3 Neurologic: CN II-XII grossly intact, moves all extremities in coordinated fashion, sensation intact  Labs on Admission: I have personally reviewed following labs and imaging studies  CBC, BMP  GFR: CrCl cannot be calculated (Unknown ideal weight.).   Creatinine Clearance: CrCl cannot be calculated (Unknown ideal weight.).    Radiological Exams on Admission: Dg Chest Port 1 View  Result Date: 12/21/2016 CLINICAL DATA:  Altered mental status.  Recent pneumonia EXAM: PORTABLE CHEST 1 VIEW COMPARISON:  December 17, 2016 FINDINGS: There is airspace consolidation in the left lower lobe with left pleural effusion. Right lung is clear. There is cardiomegaly with pulmonary vascularity within normal limits. There is aortic atherosclerosis. No adenopathy. The patient is status post right mastectomy with surgical clips in the right axillary region. No evident bone lesions. IMPRESSION: Persistent left lower lobe airspace consolidation with left effusion. No new opacity. Stable  cardiac prominence. Aortic atherosclerosis. No adenopathy. Electronically Signed   By: Lowella Grip III M.D.   On: 12/21/2016 07:21    EKG: not found   Assessment/Plan Principal Problem:   Acute encephalopathy Active Problems:   History of total colectomy - after colon Ca x 2   Hypothyroidism   Malignant hypertension   Hypokalemia   Pleural effusion associated with pulmonary infection     Acute encephalopathy most likely secondary to aalignant hypertension Head CT ordered,hydralazine prn added Continue Diovan and increase the dose of Toprol XL given her tachycardia on presentation; monitor BP response and adjust doses as needed Echo was ordered to assess LVH, wall motion and EF  Pleural effusion associated with pulmonary infection - chest Xray shows persistent consolidation Continue IV antibiotics Will obtain chest CT with contrast given her history of malignancies - breast and colon   Hypothyroidism Continue Synthroid  Electrolyte imbalance with hypokalemia and hyponatremia - replace and recheck Obtain magnesium level   DVT prophylaxis: Lovenox Code Status: full Family Communication: at bedside Disposition Plan: telemetry Consults called: none Admission status: inpatient   York Grice, [place] Pager: [phone] [facility]  If 7PM-7AM, please contact night-coverage [email or web address] Password [email or web address]  12/21/2016, 10:45 AM

## 2016-12-21 NOTE — ED Provider Notes (Signed)
[facility] DEPT Provider Note   CSN: [id number] Arrival date & time: 12/21/16  7673     History   Chief Complaint Chief Complaint  Patient presents with  . Altered Mental Status  . Pneumonia  . Medication Reaction    HPI Brandy Huff is a 83 y.o. female with history of atrial fibrillation and no pneumonia who presents with altered mental status. Patient was initiated on Levaquin 2 days ago the patient has been altered since. Patient is very shaky and restless. The patient's daughter also states that she is very fidgety. Patient has been belching a lot and complaining of nausea and abdominal pain. Patient was admitted for pneumonia at the end of March and continues to be treated. A thoracentesis was scheduled following discharge, however patient's blood pressure was too high and so Levaquin was initiated. Patient has been urinating frequently due to drinking more as directed with Levaquin.  HPI  Past Medical History:  Diagnosis Date  . Arthritis   . Atrial fibrillation ([place])   . Cancer ([place])    Breast  . Cancer ([place])    Colon  . Cancer (HCC)    Skin  . Carotid artery occlusion   . Dermatophytosis of nail   . Fall from slipping Nov. 2013  . Hypertension   . PONV (postoperative nausea and vomiting)   . Thyroid disease     Patient Active Problem List   Diagnosis Date Noted  . Acute encephalopathy 12/21/2016  . Malignant hypertension 12/21/2016  . Hypokalemia 12/21/2016  . Pleural effusion associated with pulmonary infection 12/21/2016  . History of total colectomy - after colon Ca x 2 10/30/2016  . Community acquired pneumonia 10/30/2016  . Essential hypertension 10/30/2016  . Hypothyroidism 10/30/2016  . Volume depletion 10/30/2016  . Generalized weakness 10/30/2016  . Dermatophytosis of nail 05/11/2013  . Aftercare following surgery of the circulatory system, [place] 09/29/2012  . Occlusion and stenosis of carotid artery without mention of cerebral infarction  03/17/2012    Past Surgical History:  Procedure Laterality Date  . ABDOMINAL HYSTERECTOMY    . APPENDECTOMY    . CAROTID ENDARTERECTOMY  2012   right  . CATARACT EXTRACTION     bilateral  . COLON SURGERY     Colectomy X 2  . ENDARTERECTOMY  02/08/2012   Procedure: ENDARTERECTOMY CAROTID;  Surgeon: Serafina Mitchell, MD;  Location: [facility] OR;  Service: Vascular;  Laterality: Left;  and Patch Angioplasty  . HERNIA REPAIR     Left Ventral hernia  . JOINT REPLACEMENT  2005   Bilateral knee replacements  . MASTECTOMY     Right Breast    OB History    No data available       Home Medications    Prior to Admission medications   Medication Sig Start Date End Date Taking? Authorizing Provider  levofloxacin (LEVAQUIN) 500 MG tablet Take 500 mg by mouth daily.   Yes Historical Provider, MD  levothyroxine (SYNTHROID, LEVOTHROID) 112 MCG tablet Take 112 mcg by mouth daily before breakfast.   Yes Historical Provider, MD  metoprolol succinate (TOPROL-XL) 25 MG 24 hr tablet Take 25 mg by mouth daily.   Yes Historical Provider, MD  valsartan (DIOVAN) 160 MG tablet Take 160 mg by mouth daily.  11/13/16  Yes Historical Provider, MD    Family History Family History  Problem Relation Age of Onset  . Heart murmur Sister   . Heart disease Mother   . Cancer Daughter   .  Hyperlipidemia Daughter   . Cancer Son   . Diabetes Son   . Hyperlipidemia Son   . Hypertension Son   . Heart attack Son   . Deep vein thrombosis Son     Social History Social History  Substance Use Topics  . Smoking status: Never Smoker  . Smokeless tobacco: Current User    Types: Snuff  . Alcohol use No     Allergies   Levaquin [levofloxacin in d5w]   Review of Systems Review of Systems  Unable to perform ROS: Mental status change     Physical Exam Updated Vital Signs BP (!) 177/92   Pulse 90   Temp 97.7 F (36.5 C) (Oral)   Resp (!) 26   SpO2 100%   Physical Exam  Constitutional: She appears  well-developed and well-nourished. No distress.  HENT:  Head: Normocephalic and atraumatic.  Mouth/Throat: Oropharynx is clear and moist. No oropharyngeal exudate.  Eyes: Conjunctivae are normal. Pupils are equal, round, and reactive to light. Right eye exhibits no discharge. Left eye exhibits no discharge. No scleral icterus.  Neck: Normal range of motion. Neck supple. No thyromegaly present.  Cardiovascular: Normal rate, regular rhythm, normal heart sounds and intact distal pulses.  Exam reveals no gallop and no friction rub.   No murmur heard. Pulmonary/Chest: Effort normal. No stridor. No respiratory distress. She has decreased breath sounds. She has wheezes. She has no rales.  Abdominal: Soft. Bowel sounds are normal. She exhibits no distension. There is no tenderness. There is no rebound and no guarding.  Musculoskeletal: She exhibits no edema.  Lymphadenopathy:    She has no cervical adenopathy.  Neurological: She is alert. Coordination normal.  Skin: Skin is warm and dry. No rash noted. She is not diaphoretic. No pallor.  Psychiatric: She has a normal mood and affect.  Nursing note and vitals reviewed.    ED Treatments / Results  Labs (all labs ordered are listed, but only abnormal results are displayed) Labs Reviewed  COMPREHENSIVE METABOLIC PANEL - Abnormal; Notable for the following:       Result Value   Sodium 129 (*)    Potassium 3.2 (*)    Chloride 96 (*)    Glucose, Bld 114 (*)    ALT 11 (*)    Total Bilirubin 1.6 (*)    All other components within normal limits  URINALYSIS, ROUTINE W REFLEX MICROSCOPIC - Abnormal; Notable for the following:    Color, Urine STRAW (*)    Glucose, UA 50 (*)    All other components within normal limits  CBC  CBC  CREATININE, SERUM  MAGNESIUM  I-STAT CG4 LACTIC ACID, ED  I-STAT CG4 LACTIC ACID, ED    EKG  EKG Interpretation None       Radiology Ct Head Wo Contrast  Result Date: 12/21/2016 CLINICAL DATA:  Acute  encephalopathy EXAM: CT HEAD WITHOUT CONTRAST TECHNIQUE: Contiguous axial images were obtained from the base of the skull through the vertex without intravenous contrast. COMPARISON:  None. FINDINGS: Brain: Diffuse cerebral atrophy. Extensive low-density throughout the deep white matter. No acute intracranial abnormality. Specifically, no hemorrhage, hydrocephalus, mass lesion, acute infarction, or significant intracranial injury. Vascular: No hyperdense vessel or unexpected calcification. Skull: No acute calvarial abnormality. Sinuses/Orbits: Opacified left maxillary sinus. Remainder the paranasal sinuses are clear. Mastoid air cells are clear. Orbital soft tissues unremarkable. Other: None IMPRESSION: Atrophy, and extensive chronic small vessel disease changes. No acute intracranial abnormality. Electronically Signed   By: Rolm Baptise  M.D.   On: 12/21/2016 10:53   Ct Chest W Contrast  Result Date: 12/21/2016 CLINICAL DATA:  Nonverbal and confused. Pneumonia. Altered mental status EXAM: CT CHEST WITH CONTRAST TECHNIQUE: Multidetector CT imaging of the chest was performed during intravenous contrast administration. CONTRAST:  Seventy-five ISOVUE-300 IOPAMIDOL (ISOVUE-300) INJECTION 61% COMPARISON:  Chest radiograph 12/21/2016 FINDINGS: Cardiovascular: The ascending aorta is mildly enlarged at 40 mm diameter. Intimal calcifications present knee aortic arch. Coronary calcifications present. No pericardial fluid. Mediastinum/Nodes: No axillary or supraclavicular adenopathy. Mediastinal hilar adenopathy. Esophagus normal. Lungs/Pleura: There is a large layering LEFT pleural effusion which occupies approximately 1/2 of the LEFT hemithorax volume. There is passive atelectasis of the LEFT lower lobe. No airspace disease within the LEFT lung. No pulmonary or mediastinal mass identified. No RIGHT effusion. There is bronchiectasis and peribronchial thickening along the RIGHT lower lobe bronchi and bronchioles with mild  atelectasis. No airspace disease. Upper Abdomen: Low-density lesion in the LEFT hepatic lobe likely represent cysts or hemangioma. Adrenal glands normal. Musculoskeletal: No aggressive osseous lesion. IMPRESSION: 1. Large depend LEFT pleural effusion occupying approximate half of the hemithorax volume. Consider thoracentesis with cytology evaluation. 2. No evidence of malignancy.  Mild LEFT basilar atelectasis. 3. Peribronchial thickening in the RIGHT lower lobe suggests acute or chronic bronchitis. 4. Mild dilatation of the ascending aorta to 40 mm. 5. Coronary artery calcification and aortic atherosclerotic calcification. Electronically Signed   By: Suzy Bouchard M.D.   On: 12/21/2016 11:05   Dg Chest Port 1 View  Result Date: 12/21/2016 CLINICAL DATA:  Altered mental status.  Recent pneumonia EXAM: PORTABLE CHEST 1 VIEW COMPARISON:  December 17, 2016 FINDINGS: There is airspace consolidation in the left lower lobe with left pleural effusion. Right lung is clear. There is cardiomegaly with pulmonary vascularity within normal limits. There is aortic atherosclerosis. No adenopathy. The patient is status post right mastectomy with surgical clips in the right axillary region. No evident bone lesions. IMPRESSION: Persistent left lower lobe airspace consolidation with left effusion. No new opacity. Stable cardiac prominence. Aortic atherosclerosis. No adenopathy. Electronically Signed   By: Lowella Grip III M.D.   On: 12/21/2016 07:21    Procedures Procedures (including critical care time)  Medications Ordered in ED Medications  ceFEPIme (MAXIPIME) 1 g in dextrose 5 % 50 mL IVPB (1 g Intravenous New Bag/Given 12/21/16 1050)  vancomycin (VANCOCIN) IVPB 1000 mg/200 mL premix (not administered)  vancomycin (VANCOCIN) 500 mg in sodium chloride 0.9 % 100 mL IVPB (not administered)  hydrALAZINE (APRESOLINE) injection 10 mg (not administered)  acetaminophen (TYLENOL) tablet 650 mg (not administered)  0.9 %   sodium chloride infusion ( Intravenous New Bag/Given 12/21/16 1048)  irbesartan (AVAPRO) tablet 150 mg (not administered)  levothyroxine (SYNTHROID, LEVOTHROID) tablet 112 mcg (not administered)  enoxaparin (LOVENOX) injection 40 mg (not administered)  sodium chloride flush (NS) 0.9 % injection 3 mL (not administered)  acetaminophen (TYLENOL) tablet 650 mg (not administered)    Or  acetaminophen (TYLENOL) suppository 650 mg (not administered)  ondansetron (ZOFRAN) tablet 4 mg (not administered)    Or  ondansetron (ZOFRAN) injection 4 mg (not administered)  traZODone (DESYREL) tablet 25 mg (not administered)  potassium chloride (K-DUR,KLOR-CON) CR tablet 30 mEq (not administered)  metoprolol succinate (TOPROL-XL) 24 hr tablet 50 mg (not administered)  iopamidol (ISOVUE-300) 61 % injection (75 mLs  Contrast Given 12/21/16 1028)     Initial Impression / Assessment and Plan / ED Course  I have reviewed the triage vital signs and the  nursing notes.  Pertinent labs & imaging results that were available during my care of the patient were reviewed by me and considered in my medical decision making (see chart for details).     Patient with altered mental status. Patient with stable pleural effusion and left lower lung opacity. Will initiate antibiotics in the ED, cefepime and vancomycin for HCAP. CMP shows sodium 129, potassium 3.2, chloride 96, total bilirubin 1.6. Initial lactate 1.27. UA is negative. CXR shows persistent left lower lobe airspace consolidation with left effusion, no new opacity. Patient given her at home hypertension medications with decrease of blood pressure in the ED. I consulted [facility] and spoke with Georjean Mode who will admit the patient for further evaluation and treatment. Family understands and agree with plan. Patient also evaluated by Dr. Vanita Panda who guided the patient's management and agrees with plan.  Final Clinical Impressions(s) / ED Diagnoses   Final  diagnoses:  Pneumonia  Acute encephalopathy  Pleural effusion    New Prescriptions Current Discharge Medication List       Frederica Kuster, Hershal Coria 12/21/16 [place]    Carmin Muskrat, MD 12/25/16 1615

## 2016-12-21 NOTE — ED Notes (Signed)
Admitting at bedside 

## 2016-12-21 NOTE — Progress Notes (Signed)
Received report on pt.

## 2016-12-21 NOTE — ED Triage Notes (Signed)
Pt arrives via EMS with c/o of AMS per family after taken Levaquin for recent dx of PNA; Pt has hx of right eye blindness; Pt oriented to self and family only; Pt states she has some abdominal pain on arrival; pt brought in on 2L 02;  Per family pt has had AMS x 2 days.

## 2016-12-21 NOTE — Progress Notes (Signed)
Pharmacy Antibiotic Note Brandy Huff is a 83 y.o. female admitted on 12/21/2016. She was recently diagnosed with pneumonia and was started on Levaquin as an outpatient. She arrives today after family reports two days of altered mental status, shaky and restlessness. Patient was receiving levaquin 500 mg po daily which was likely a poor choice of antimicrobials given the age and weight of the patient. Will continue treatment of pneumonia with vancomcyin and cefepime. Pt received cefepime x1 in the ED. Mild hyponatremia noted at baseline, WBC wnl, LA wnl, afebrile.    Plan: Vancomycin 500 IV every 24 hours.  Goal trough 15-20 mcg/mL. F/u gram negative continuation Monitor renal fx, cultures, VT at steady state given age of patient  Temp (24hrs), Avg:97.7 F (36.5 C), Min:97.7 F (36.5 C), Max:97.7 F (36.5 C)   Recent Labs Lab 12/21/16 0719 12/21/16 0731  WBC 6.4  --   CREATININE 0.75  --   LATICACIDVEN  --  1.27    CrCl cannot be calculated (Unknown ideal weight.).    Allergies  Allergen Reactions  . Levaquin [Levofloxacin In D5w] Other (See Comments)    Altered Mental Status    Antimicrobials this admission: 5/4 cefepime > 5/4 vancomycin >   Dose adjustments this admission: N/A   Microbiology results: None at this time   Harvel Quale 12/21/2016 9:07 AM

## 2016-12-21 NOTE — ED Notes (Signed)
Pt given ginger ale.

## 2016-12-21 NOTE — ED Notes (Signed)
Patient transported to CT 

## 2016-12-21 NOTE — ED Notes (Signed)
ED Provider at bedside. 

## 2016-12-21 NOTE — Progress Notes (Signed)
Brandy Huff is a 83 y.o. female patient admitted from ED awake, alert - oriented  X 4 - no acute distress noted.  VSS - Blood pressure (!) 177/92, pulse (!) 106, temperature 98.1 F (36.7 C), temperature source Oral, resp. rate (!) 26, SpO2 99 %.    IV in place, occlusive dsg intact without redness.  Orientation to room, and floor completed with information packet given to patient/family.  Patient declined safety video at this time.  Admission INP armband ID verified with patient/family, and in place.   SR up x 2, fall assessment complete, with patient and family able to verbalize understanding of risk associated with falls. Call light within reach, family at bedside.  Skin, clean-dry- intact with skin tears on lower legs. No other evidence of skin break down noted on exam.     Will cont to eval and treat per MD orders.  Celine Ahr, RN 12/21/2016 11:21 AM

## 2016-12-22 DIAGNOSIS — E039 Hypothyroidism, unspecified: Secondary | ICD-10-CM

## 2016-12-22 DIAGNOSIS — W19XXXA Unspecified fall, initial encounter: Secondary | ICD-10-CM

## 2016-12-22 DIAGNOSIS — I10 Essential (primary) hypertension: Secondary | ICD-10-CM

## 2016-12-22 DIAGNOSIS — E876 Hypokalemia: Secondary | ICD-10-CM

## 2016-12-22 DIAGNOSIS — G9340 Encephalopathy, unspecified: Secondary | ICD-10-CM

## 2016-12-22 DIAGNOSIS — R296 Repeated falls: Secondary | ICD-10-CM

## 2016-12-22 DIAGNOSIS — J90 Pleural effusion, not elsewhere classified: Secondary | ICD-10-CM

## 2016-12-22 LAB — CBC
HEMATOCRIT: 40.6 % (ref 36.0–46.0)
HEMOGLOBIN: 13.9 g/dL (ref 12.0–15.0)
MCH: 31.6 pg (ref 26.0–34.0)
MCHC: 34.2 g/dL (ref 30.0–36.0)
MCV: 92.3 fL (ref 78.0–100.0)
Platelets: 184 10*3/uL (ref 150–400)
RBC: 4.4 MIL/uL (ref 3.87–5.11)
RDW: 14.2 % (ref 11.5–15.5)
WBC: 6.8 10*3/uL (ref 4.0–10.5)

## 2016-12-22 LAB — COMPREHENSIVE METABOLIC PANEL
ALBUMIN: 3.2 g/dL — AB (ref 3.5–5.0)
ALK PHOS: 55 U/L (ref 38–126)
ALT: 10 U/L — AB (ref 14–54)
AST: 17 U/L (ref 15–41)
Anion gap: 8 (ref 5–15)
BILIRUBIN TOTAL: 1.2 mg/dL (ref 0.3–1.2)
CALCIUM: 8.9 mg/dL (ref 8.9–10.3)
CO2: 27 mmol/L (ref 22–32)
CREATININE: 0.61 mg/dL (ref 0.44–1.00)
Chloride: 100 mmol/L — ABNORMAL LOW (ref 101–111)
GFR calc Af Amer: >60 mL/min (ref >60)
GLUCOSE: 112 mg/dL — AB (ref 65–99)
Potassium: 3.6 mmol/L (ref 3.5–5.1)
Sodium: 135 mmol/L (ref 135–145)
TOTAL PROTEIN: 6 g/dL — AB (ref 6.5–8.1)

## 2016-12-22 LAB — PROTIME-INR
INR: 1.04
Prothrombin Time: 13.6 seconds (ref 11.4–15.2)

## 2016-12-22 LAB — APTT: aPTT: 38 seconds — ABNORMAL HIGH (ref 24–36)

## 2016-12-22 LAB — TSH: TSH: 4.714 u[IU]/mL — AB (ref 0.350–4.500)

## 2016-12-22 LAB — MRSA PCR SCREENING: MRSA by PCR: NEGATIVE

## 2016-12-22 LAB — PROCALCITONIN

## 2016-12-22 MED ORDER — AMLODIPINE BESYLATE 5 MG PO TABS
5.0000 mg | ORAL_TABLET | Freq: Every day | ORAL | Status: DC
  Administered 2016-12-22 – 2016-12-24 (×3): 5 mg via ORAL
  Filled 2016-12-22 (×3): qty 1

## 2016-12-22 MED ORDER — HALOPERIDOL LACTATE 5 MG/ML IJ SOLN
2.0000 mg | Freq: Four times a day (QID) | INTRAMUSCULAR | Status: DC | PRN
  Administered 2016-12-22: 2 mg via INTRAVENOUS
  Filled 2016-12-22: qty 1

## 2016-12-22 MED ORDER — SODIUM CHLORIDE 0.9 % IV SOLN
INTRAVENOUS | Status: AC
  Administered 2016-12-22: 16:00:00 via INTRAVENOUS

## 2016-12-22 MED ORDER — HALOPERIDOL LACTATE 5 MG/ML IJ SOLN
2.0000 mg | Freq: Once | INTRAMUSCULAR | Status: AC
  Administered 2016-12-22: 2 mg via INTRAVENOUS
  Filled 2016-12-22: qty 1

## 2016-12-22 MED ORDER — HALOPERIDOL LACTATE 5 MG/ML IJ SOLN
4.0000 mg | Freq: Four times a day (QID) | INTRAMUSCULAR | Status: DC | PRN
  Filled 2016-12-22: qty 1

## 2016-12-22 NOTE — Progress Notes (Signed)
PROGRESS NOTE   Brandy Huff  FMB:[id number]    DOB: 12-06-1917    DOA: 12/21/2016  PCP: [name], MD   I have briefly reviewed patients previous medical records in [facility].  Brief Narrative:  83 year old female, lives alone, ambulates with the help of a walker, has 24/7 supervision and assistance from her multiple children, at baseline mild dementia but mostly coherent, PMH of A. fib-not on anticoagulation, HTN, hypothyroid, right eye corneal opacity/blindness, bilateral CEA, breast and colon cancer, dementia, hospitalized 10/30/16-11/05/16 for community-acquired pneumonia, did well post discharge, evaluated by PCP couple of weeks later and follow-up chest x-ray showed left pleural effusion, sent to IR for thoracentesis on 4/25 but due to markedly elevated blood pressures (210/110) and not enough fluid, thoracentesis was not done, followed up with PCP as prior to admission and prescribed levofloxacin, after the first dose noted to be extremely confused, hallucinating, agitated which progressively got worse, was admitted to the hospital for further evaluation and management. Pulmonology was consulted but holding off thoracentesis until patient mental status improves, blood pressure control is better and not acutely symptomatic from effusion.   Assessment & Plan:   Principal Problem:   Acute encephalopathy Active Problems:   History of total colectomy - after colon Ca x 2   Community acquired pneumonia   Essential hypertension   Hypothyroidism   Malignant hypertension   Hypokalemia   Pleural effusion associated with pulmonary infection   Falls   1. Acute confusional state/acute encephalopathy: Most likely secondary to recent use of levofloxacin, given temporal relationship to initiation of medication, complicating underlying dementia. Less likely due to malignant hypertension. Apparently had similar presentation when ciprofloxacin was used for UTI in the past. Levofloxacin  discontinued. CT head without acute findings. No focal deficits on clinical exam. Haldol when necessary. 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 Avoid opioids, benzodiazepines or other psychotropic medications. Urine microscopy not indicated off UTI. 2. Large left pleural effusion: Possibly parapneumonic versus other etiologies. In the absence of fever, leukocytosis or symptoms, low index of suspicion for infectious etiology. Did receive her dose of cefepime and vancomycin in the ED. As discussed with PCCM MD, hold off on antibiotics and monitor. Check pro calcitonin. Consider thoracentesis when mental status and blood pressure control better and if she becomes acutely symptomatic. Currently not symptomatic. 3. Dehydration with hyponatremia: Possibly related to poor oral intake. Improved. Continue gentle IV normal saline. 4. Hypokalemia: Replaced. 5. Essential hypertension: Poorly controlled. Continue ARB and metoprolol. When necessary IV hydralazine. Will add low-dose amlodipine. 6. Hypothyroid: Continue Synthroid. Check TSH. 7. Paroxysmal A. fib: Currently in sinus rhythm. Continue metoprolol. Not candidate for anticoagulation. 8. Dementia: Management per problem #1   DVT prophylaxis: Lovenox Code Status: Full code. Discussed in detail with patient's son at bedside to discuss with rest of his siblings and reconsider changing this status. Family Communication: Discussed in detail with patient's son and daughter at bedside. Disposition: DC home when medically stable.   Consultants:  Pulmonology.   Procedures:  None.  Antimicrobials:  IV vancomycin and cefepime-discontinued.    Subjective: Seen this morning. Confused, intermittently mildly agitated, intermittent visual hallucinations. As per family, some chest congestion and wheezing but no overt difficulty breathing. Decreased oral intake.   ROS: No fever or chills reported.  Objective:  Vitals:   12/21/16 1532 12/21/16 2305 12/22/16 0503  12/22/16 0951  BP: (!) 144/64 (!) 160/64 (!) 157/68 (!) 183/78  Pulse: 76 80 78 80  Resp: 20 18 17  Temp: 97.6 F (36.4 C) 98.8 F (37.1 C) 97.7 F (36.5 C)   TempSrc:  Oral Axillary   SpO2: 99% 100% 98%   Weight:      Height:        Examination:  General exam: Pleasant elderly female, small built and frail, chronically ill-looking, lying comfortably propped up in bed, confused and intermittently agitated, wanting to get out of bed to urinate. Respiratory system: Reduced breath sounds in left lung fields. Rest of lung fields clear to auscultation without wheezing, rhonchi or crackles. Respiratory effort normal. Cardiovascular system: S1 & S2 heard, RRR. No JVD, murmurs, rubs, gallops or clicks. No pedal edema. Telemetry: Sinus rhythm with first-degree AV block. Gastrointestinal system: Abdomen is nondistended, soft and nontender. No organomegaly or masses felt. Normal bowel sounds heard. Central nervous system: Alert but not oriented. No focal neurological deficits. Right corneal opacity Extremities: Symmetric 5 x 5 power. Superficial bruising of left shin. Skin: No rashes, lesions or ulcers Psychiatry: Judgement and insight impaired.    Data Reviewed: I have personally reviewed following labs and imaging studies  CBC:  Recent Labs Lab 12/21/16 0719 12/21/16 1148 12/22/16 0347  WBC 6.4 8.8 6.8  HGB 14.4 14.1 13.9  HCT 42.0 41.7 40.6  MCV 92.1 92.5 92.3  PLT 182 214 382   Basic Metabolic Panel:  Recent Labs Lab 12/21/16 0719 12/21/16 1148 12/22/16 0807  NA 129*  --  135  K 3.2*  --  3.6  CL 96*  --  100*  CO2 23  --  27  GLUCOSE 114*  --  112*  BUN 12  --  <5*  CREATININE 0.75 0.69 0.61  CALCIUM 9.3  --  8.9  MG  --  1.7  --    Liver Function Tests:  Recent Labs Lab 12/21/16 0719 12/22/16 0807  AST 20 17  ALT 11* 10*  ALKPHOS 61 55  BILITOT 1.6* 1.2  PROT 6.6 6.0*  ALBUMIN 3.6 3.2*     Radiology Studies: Ct Head Wo Contrast  Result Date:  12/21/2016 CLINICAL DATA:  Acute encephalopathy EXAM: CT HEAD WITHOUT CONTRAST TECHNIQUE: Contiguous axial images were obtained from the base of the skull through the vertex without intravenous contrast. COMPARISON:  None. FINDINGS: Brain: Diffuse cerebral atrophy. Extensive low-density throughout the deep white matter. No acute intracranial abnormality. Specifically, no hemorrhage, hydrocephalus, mass lesion, acute infarction, or significant intracranial injury. Vascular: No hyperdense vessel or unexpected calcification. Skull: No acute calvarial abnormality. Sinuses/Orbits: Opacified left maxillary sinus. Remainder the paranasal sinuses are clear. Mastoid air cells are clear. Orbital soft tissues unremarkable. Other: None IMPRESSION: Atrophy, and extensive chronic small vessel disease changes. No acute intracranial abnormality. Electronically Signed   By: Rolm Baptise M.D.   On: 12/21/2016 10:53   Ct Chest W Contrast  Result Date: 12/21/2016 CLINICAL DATA:  Nonverbal and confused. Pneumonia. Altered mental status EXAM: CT CHEST WITH CONTRAST TECHNIQUE: Multidetector CT imaging of the chest was performed during intravenous contrast administration. CONTRAST:  Seventy-five ISOVUE-300 IOPAMIDOL (ISOVUE-300) INJECTION 61% COMPARISON:  Chest radiograph 12/21/2016 FINDINGS: Cardiovascular: The ascending aorta is mildly enlarged at 40 mm diameter. Intimal calcifications present knee aortic arch. Coronary calcifications present. No pericardial fluid. Mediastinum/Nodes: No axillary or supraclavicular adenopathy. Mediastinal hilar adenopathy. Esophagus normal. Lungs/Pleura: There is a large layering LEFT pleural effusion which occupies approximately 1/2 of the LEFT hemithorax volume. There is passive atelectasis of the LEFT lower lobe. No airspace disease within the LEFT lung. No pulmonary or mediastinal mass identified.  No RIGHT effusion. There is bronchiectasis and peribronchial thickening along the RIGHT lower lobe  bronchi and bronchioles with mild atelectasis. No airspace disease. Upper Abdomen: Low-density lesion in the LEFT hepatic lobe likely represent cysts or hemangioma. Adrenal glands normal. Musculoskeletal: No aggressive osseous lesion. IMPRESSION: 1. Large depend LEFT pleural effusion occupying approximate half of the hemithorax volume. Consider thoracentesis with cytology evaluation. 2. No evidence of malignancy.  Mild LEFT basilar atelectasis. 3. Peribronchial thickening in the RIGHT lower lobe suggests acute or chronic bronchitis. 4. Mild dilatation of the ascending aorta to 40 mm. 5. Coronary artery calcification and aortic atherosclerotic calcification. Electronically Signed   By: Suzy Bouchard M.D.   On: 12/21/2016 11:05   Dg Chest Port 1 View  Result Date: 12/21/2016 CLINICAL DATA:  Altered mental status.  Recent pneumonia EXAM: PORTABLE CHEST 1 VIEW COMPARISON:  December 17, 2016 FINDINGS: There is airspace consolidation in the left lower lobe with left pleural effusion. Right lung is clear. There is cardiomegaly with pulmonary vascularity within normal limits. There is aortic atherosclerosis. No adenopathy. The patient is status post right mastectomy with surgical clips in the right axillary region. No evident bone lesions. IMPRESSION: Persistent left lower lobe airspace consolidation with left effusion. No new opacity. Stable cardiac prominence. Aortic atherosclerosis. No adenopathy. Electronically Signed   By: Lowella Grip III M.D.   On: 12/21/2016 07:21        Scheduled Meds: . enoxaparin (LOVENOX) injection  40 mg Subcutaneous Q24H  . irbesartan  150 mg Oral Daily  . levothyroxine  112 mcg Oral QAC breakfast  . metoprolol succinate  50 mg Oral Daily  . sodium chloride flush  3 mL Intravenous Q12H   Continuous Infusions: . sodium chloride 75 mL/hr at 12/22/16 0115  . vancomycin Stopped (12/22/16 1051)     LOS: 1 day     Sky Borboa, MD, FACP, FHM. [facility] Pager [phone]  If 7PM-7AM, please contact night-coverage [email or web address] Password [email or web address] 12/22/2016, 3:04 PM

## 2016-12-22 NOTE — Consult Note (Signed)
Name: Brandy Huff MRN: [id number] DOB: 1917-09-18    ADMISSION DATE:  12/21/2016 CONSULTATION DATE:  5/5  REFERRING MD :  [facility]  CHIEF COMPLAINT:  Confusion/HTN  BRIEF PATIENT DESCRIPTION: Confused 83 yo in no distress.  SIGNIFICANT EVENTS    STUDIES:     HISTORY OF PRESENT ILLNESS:   83 yo female with recent treatment for pneumonia with Levaquin which has led to confusion and hallucinations. She has had a left pleural effusion that IR was going to tap 4/25 but due to low fluid volume and BP 210/110 thoracentesis was canceled. She has a plethora of health issues that include but limited to, Afib(no anticoagulation) malignant hypertension poorly controlled, falls, confusion, onychosis and general FTT. She is a full code by record. PCCM called to evaluate large left pleural effusion by CT scan. Note she is on room air with sats >95%, no perceived SOB. She is very confused and is having hallucinations. It required 3 people to hold her up for ultra sound evaluation and she is incredible sensitive to any interventions. Since she is hypertensive (183/78) confused and uncooperative and thoracentesis is note for increased resp failure then BP control and resolution may be needed prior to any invasive procedures.  PAST MEDICAL HISTORY :   has a past medical history of Arthritis; Atrial fibrillation ([place]); Cancer ([place]); Cancer ([place]); Cancer ([place]); Carotid artery occlusion; Dermatophytosis of nail; Fall from slipping (Nov. 2013); Hypertension; PONV (postoperative nausea and vomiting); and Thyroid disease.  has a past surgical history that includes Cataract extraction; Hernia repair; Joint replacement (2005); Mastectomy; Appendectomy; Colon surgery; Abdominal hysterectomy; Carotid endarterectomy (2012); and Endarterectomy (02/08/2012). Prior to Admission medications   Medication Sig Start Date End Date Taking? Authorizing Provider  levofloxacin (LEVAQUIN) 500 MG tablet Take 500 mg by mouth daily.    Yes [provider]  levothyroxine (SYNTHROID, LEVOTHROID) 112 MCG tablet Take 112 mcg by mouth daily before breakfast.   Yes [provider]  metoprolol succinate (TOPROL-XL) 25 MG 24 hr tablet Take 25 mg by mouth daily.   Yes [provider]  valsartan (DIOVAN) 160 MG tablet Take 160 mg by mouth daily.  11/13/16  Yes [provider]   Allergies  Allergen Reactions  . Levaquin [Levofloxacin In D5w] Other (See Comments)    Altered Mental Status    FAMILY HISTORY:  family history includes Cancer in her daughter and son; Deep vein thrombosis in her son; Diabetes in her son; Heart attack in her son; Heart disease in her mother; Heart murmur in her sister; Hyperlipidemia in her daughter and son; Hypertension in her son. SOCIAL HISTORY:  reports that she has never smoked. Her smokeless tobacco use includes Snuff. She reports that she does not drink alcohol or use drugs.  REVIEW OF SYSTEMS:   NA due to confusion  SUBJECTIVE:  Confused  VITAL SIGNS: Temp:  [97.6 F (36.4 C)-98.8 F (37.1 C)] 97.7 F (36.5 C) (05/05 0503) Pulse Rate:  [76-80] 80 (05/05 0951) Resp:  [17-20] 17 (05/05 0503) BP: (144-183)/(64-78) 183/78 (05/05 0951) SpO2:  [98 %-100 %] 98 % (05/05 0503)  PHYSICAL EXAMINATION: General:  Frail confused 83 yo female Neuro:  Confused, hallucinations  HEENT: No jvd Cardiovascular:  HSD Lungs:  Decreased bs left base Abdomen:  Obese +bs Musculoskeletal:  intact Skin:  Ecchymosis on lower ext. Toes with chronic nail fungus   Recent Labs Lab 12/21/16 0719 12/21/16 1148 12/22/16 0807  NA 129*  --  135  K 3.2*  --  3.6  CL 96*  --  100*  CO2 23  --  27  BUN 12  --  <5*  CREATININE 0.75 0.69 0.61  GLUCOSE 114*  --  112*    Recent Labs Lab 12/21/16 0719 12/21/16 1148 12/22/16 0347  HGB 14.4 14.1 13.9  HCT 42.0 41.7 40.6  WBC 6.4 8.8 6.8  PLT 182 214 184   Ct Head Wo Contrast  Result Date: 12/21/2016 CLINICAL DATA:   Acute encephalopathy EXAM: CT HEAD WITHOUT CONTRAST TECHNIQUE: Contiguous axial images were obtained from the base of the skull through the vertex without intravenous contrast. COMPARISON:  None. FINDINGS: Brain: Diffuse cerebral atrophy. Extensive low-density throughout the deep white matter. No acute intracranial abnormality. Specifically, no hemorrhage, hydrocephalus, mass lesion, acute infarction, or significant intracranial injury. Vascular: No hyperdense vessel or unexpected calcification. Skull: No acute calvarial abnormality. Sinuses/Orbits: Opacified left maxillary sinus. Remainder the paranasal sinuses are clear. Mastoid air cells are clear. Orbital soft tissues unremarkable. Other: None IMPRESSION: Atrophy, and extensive chronic small vessel disease changes. No acute intracranial abnormality. Electronically Signed   By: Rolm Baptise M.D.   On: 12/21/2016 10:53   Ct Chest W Contrast  Result Date: 12/21/2016 CLINICAL DATA:  Nonverbal and confused. Pneumonia. Altered mental status EXAM: CT CHEST WITH CONTRAST TECHNIQUE: Multidetector CT imaging of the chest was performed during intravenous contrast administration. CONTRAST:  Seventy-five ISOVUE-300 IOPAMIDOL (ISOVUE-300) INJECTION 61% COMPARISON:  Chest radiograph 12/21/2016 FINDINGS: Cardiovascular: The ascending aorta is mildly enlarged at 40 mm diameter. Intimal calcifications present knee aortic arch. Coronary calcifications present. No pericardial fluid. Mediastinum/Nodes: No axillary or supraclavicular adenopathy. Mediastinal hilar adenopathy. Esophagus normal. Lungs/Pleura: There is a large layering LEFT pleural effusion which occupies approximately 1/2 of the LEFT hemithorax volume. There is passive atelectasis of the LEFT lower lobe. No airspace disease within the LEFT lung. No pulmonary or mediastinal mass identified. No RIGHT effusion. There is bronchiectasis and peribronchial thickening along the RIGHT lower lobe bronchi and bronchioles with  mild atelectasis. No airspace disease. Upper Abdomen: Low-density lesion in the LEFT hepatic lobe likely represent cysts or hemangioma. Adrenal glands normal. Musculoskeletal: No aggressive osseous lesion. IMPRESSION: 1. Large depend LEFT pleural effusion occupying approximate half of the hemithorax volume. Consider thoracentesis with cytology evaluation. 2. No evidence of malignancy.  Mild LEFT basilar atelectasis. 3. Peribronchial thickening in the RIGHT lower lobe suggests acute or chronic bronchitis. 4. Mild dilatation of the ascending aorta to 40 mm. 5. Coronary artery calcification and aortic atherosclerotic calcification. Electronically Signed   By: Suzy Bouchard M.D.   On: 12/21/2016 11:05   Dg Chest Port 1 View  Result Date: 12/21/2016 CLINICAL DATA:  Altered mental status.  Recent pneumonia EXAM: PORTABLE CHEST 1 VIEW COMPARISON:  December 17, 2016 FINDINGS: There is airspace consolidation in the left lower lobe with left pleural effusion. Right lung is clear. There is cardiomegaly with pulmonary vascularity within normal limits. There is aortic atherosclerosis. No adenopathy. The patient is status post right mastectomy with surgical clips in the right axillary region. No evident bone lesions. IMPRESSION: Persistent left lower lobe airspace consolidation with left effusion. No new opacity. Stable cardiac prominence. Aortic atherosclerosis. No adenopathy. Electronically Signed   By: Lowella Grip III M.D.   On: 12/21/2016 07:21    ASSESSMENT:     Pleural effusion associated with pulmonary infection   Acute encephalopathy   History of total colectomy - after colon Ca x 2   Community acquired pneumonia   Essential hypertension  Hypothyroidism   Malignant hypertension   Hypokalemia   Falls  Discussion: 83 yo female with recent treatment for pneumonia with Levaquin which has led to confusion and hallucinations. She has had a left pleural effusion that IR was going to tap 4/25 but due to  low fluid volume and BP 210/110 thoracentesis was canceled. She has a plethora of health issues that include but limited to, Afib(no anticoagulation) malignant hypertension poorly controlled, falls, confusion, onychosis and general FTT. She is a full code by record. PCCM called to evaluate large left pleural effusion by CT scan. Note she is on room air with sats >95%, no perceived SOB. She is very confused and is having hallucinations. It required 3 people to hold her up for ultra sound evaluation and she is incredible sensitive to any interventions. Since she is hypertensive (183/78) confused and uncooperative and thoracentesis is note for increased resp failure then BP control and resolution may be needed prior to any invasive procedures.    PLAN: Control blood pressure Resolve confusional state Consider thoracentesis at later date.    Richardson Landry Minor ACNP [facility] P[facility]M Pager [phone] till 3 pm If no answer page [phone] 12/22/2016, 12:52 PM   ATTENDING NOTE / ATTESTATION NOTE :   I have discussed the case with the resident/APP  Richardson Landry Minor NP  I agree with the resident/APP's  history, physical examination, assessment, and plans.    I have edited the above note and modified it according to our agreed history, physical examination, assessment and plan.   Briefly, 63F, with fair fxnal capacity, comes in with delirium. Nonsmoker. Not known to have lung disease. She has history of colon cancer for which she had resection as well as breast cancer, right. Cancers were diagnosed several years ago. She is in remission. As mentioned, patient has fair functional capacity. She recently presented with one-week history of cough, dyspnea, congestion. There was concern for pneumonia. She was started on abx but she did not get better. She had a chest x-ray which showed effusion. She had a chest CT scan which showed large effusion on the right. She was scheduled to have ultrasound guided thoracentesis as an  outpatient. On the day of the procedure, her blood pressure was elevated so they did not proceed with thoracentesis. Primary care doctor decided to treat her with levofloxacin. She became delirious with levofloxacin. Because of her delirium getting worse, she ended up being admitted to the hospital.     Vitals:  Vitals:   12/22/16 0503 12/22/16 0951 12/22/16 1616 12/22/16 1621  BP: (!) 157/68 (!) 183/78 (!) 182/95 (!) 182/95  Pulse: 78 80  94  Resp: 17     Temp: 97.7 F (36.5 C)     TempSrc: Axillary     SpO2: 98%     Weight:      Height:        Constitutional/General: well-nourished, well-developed, restrained, not in any distress. Confused. NAD.   Body mass index is 26.34 kg/m. Wt Readings from Last 3 Encounters:  12/21/16 65.3 kg (144 lb)  09/28/13 52.7 kg (116 lb 1.6 oz)  05/27/13 59 kg (130 lb)    HEENT: PERLA, anicteric sclerae. (-) Oral thrush.  Neck: No masses. Midline trachea. No JVD, (-) LAD. (-) bruits appreciated.  Respiratory/Chest: Grossly normal chest. (-) deformity. (-) Accessory muscle use.  Symmetric expansion. Diminished BS on both lower lung zones. Very diminished on the L lung (-) wheezing, crackles, rhonchi (-) egophony  Cardiovascular: Regular rate and  rhythm, heart sounds normal, no murmur or gallops,  Trace peripheral edema  Gastrointestinal:  Normal bowel sounds. Soft, non-tender. No hepatosplenomegaly.  (-) masses.   Musculoskeletal:  Normal muscle tone.   Extremities: Grossly normal. (-) clubbing, cyanosis.  Trace  edema  Skin: (-) rash,lesions seen.   Neurological/Psychiatric :  CN grossly intact. (-) lateralizing signs.     CBC Recent Labs     12/21/16  0719  12/21/16  1148  12/22/16  0347  WBC  6.4  8.8  6.8  HGB  14.4  14.1  13.9  HCT  42.0  41.7  40.6  PLT  182  214  184    Coag's No results for input(s): APTT, INR in the last 72 hours.  BMET Recent Labs     12/21/16  0719  12/21/16  1148  12/22/16  0807  NA   129*   --   135  K  3.2*   --   3.6  CL  96*   --   100*  CO2  23   --   27  BUN  12   --   <5*  CREATININE  0.75  0.69  0.61  GLUCOSE  114*   --   112*    Electrolytes Recent Labs     12/21/16  0719  12/21/16  1148  12/22/16  0807  CALCIUM  9.3   --   8.9  MG   --   1.7   --     Sepsis Markers No results for input(s): PROCALCITON, O2SATVEN in the last 72 hours.  Invalid input(s): LACTICACIDVEN  ABG No results for input(s): PHART, PCO2ART, PO2ART in the last 72 hours.  Liver Enzymes Recent Labs     12/21/16  0719  12/22/16  0807  AST  20  17  ALT  11*  10*  ALKPHOS  61  55  BILITOT  1.6*  1.2  ALBUMIN  3.6  3.2*    Cardiac Enzymes No results for input(s): TROPONINI, PROBNP in the last 72 hours.  Glucose No results for input(s): GLUCAP in the last 72 hours.  Imaging Ct Head Wo Contrast  Result Date: 12/21/2016 CLINICAL DATA:  Acute encephalopathy EXAM: CT HEAD WITHOUT CONTRAST TECHNIQUE: Contiguous axial images were obtained from the base of the skull through the vertex without intravenous contrast. COMPARISON:  None. FINDINGS: Brain: Diffuse cerebral atrophy. Extensive low-density throughout the deep white matter. No acute intracranial abnormality. Specifically, no hemorrhage, hydrocephalus, mass lesion, acute infarction, or significant intracranial injury. Vascular: No hyperdense vessel or unexpected calcification. Skull: No acute calvarial abnormality. Sinuses/Orbits: Opacified left maxillary sinus. Remainder the paranasal sinuses are clear. Mastoid air cells are clear. Orbital soft tissues unremarkable. Other: None IMPRESSION: Atrophy, and extensive chronic small vessel disease changes. No acute intracranial abnormality. Electronically Signed   By: Rolm Baptise M.D.   On: 12/21/2016 10:53   Ct Chest W Contrast  Result Date: 12/21/2016 CLINICAL DATA:  Nonverbal and confused. Pneumonia. Altered mental status EXAM: CT CHEST WITH CONTRAST TECHNIQUE: Multidetector CT  imaging of the chest was performed during intravenous contrast administration. CONTRAST:  Seventy-five ISOVUE-300 IOPAMIDOL (ISOVUE-300) INJECTION 61% COMPARISON:  Chest radiograph 12/21/2016 FINDINGS: Cardiovascular: The ascending aorta is mildly enlarged at 40 mm diameter. Intimal calcifications present knee aortic arch. Coronary calcifications present. No pericardial fluid. Mediastinum/Nodes: No axillary or supraclavicular adenopathy. Mediastinal hilar adenopathy. Esophagus normal. Lungs/Pleura: There is a large layering LEFT pleural effusion which occupies approximately 1/2 of the  LEFT hemithorax volume. There is passive atelectasis of the LEFT lower lobe. No airspace disease within the LEFT lung. No pulmonary or mediastinal mass identified. No RIGHT effusion. There is bronchiectasis and peribronchial thickening along the RIGHT lower lobe bronchi and bronchioles with mild atelectasis. No airspace disease. Upper Abdomen: Low-density lesion in the LEFT hepatic lobe likely represent cysts or hemangioma. Adrenal glands normal. Musculoskeletal: No aggressive osseous lesion. IMPRESSION: 1. Large depend LEFT pleural effusion occupying approximate half of the hemithorax volume. Consider thoracentesis with cytology evaluation. 2. No evidence of malignancy.  Mild LEFT basilar atelectasis. 3. Peribronchial thickening in the RIGHT lower lobe suggests acute or chronic bronchitis. 4. Mild dilatation of the ascending aorta to 40 mm. 5. Coronary artery calcification and aortic atherosclerotic calcification. Electronically Signed   By: Suzy Bouchard M.D.   On: 12/21/2016 11:05   Dg Chest Port 1 View  Result Date: 12/21/2016 CLINICAL DATA:  Altered mental status.  Recent pneumonia EXAM: PORTABLE CHEST 1 VIEW COMPARISON:  December 17, 2016 FINDINGS: There is airspace consolidation in the left lower lobe with left pleural effusion. Right lung is clear. There is cardiomegaly with pulmonary vascularity within normal limits. There  is aortic atherosclerosis. No adenopathy. The patient is status post right mastectomy with surgical clips in the right axillary region. No evident bone lesions. IMPRESSION: Persistent left lower lobe airspace consolidation with left effusion. No new opacity. Stable cardiac prominence. Aortic atherosclerosis. No adenopathy. Electronically Signed   By: Lowella Grip III M.D.   On: 12/21/2016 07:21   Assessment-  Large left Pleural effusion, etiology uncertain at this point : Could be from underlying undiagnosed congestive heart failure pEF Could be malignant. Patient with history of breast cancer in the right breast as well as colon cancer. Allegedly, in remission. Less likely infectious at this point. No fever. WBC was normal. No big infiltrate on chest CT scan. Inflammatory - At this point, patient is not in distress necessitating urgent thoracentesis. I think what's prudent is to have her blood pressure controlled as well as her delirium improved. She is at risk for complication, specifically pneumothorax or bleeding given her agitation and delirium if we do thoracentesis. - Suggest have IR do bedside thoracentesis this week. Pls send pleural fluid for all routine studies and culture and cytology.  - Suggest holding off on antibiotics unless she clinically deteriorates. - Agree with Procalcitonin. - Check blood culture as well as MRSA swab.  HTN  - continue BP management per Primary.    Family :Family updated at length today.  I discussed the plan with patient's son. PCCM will peripherally follow pt.  Pls let us know if IR can not do thoracentesis next week.     Monica Becton, MD 12/22/2016, 5:37 PM [facility] Pager [phone] After 3 pm or if no answer, call [phone]

## 2016-12-22 NOTE — Significant Event (Signed)
  Above is left lung view with sizeable effusion   Second view of left lung effusion. Note trapped lung.  Richardson Landry Minor ACNP [facility] P[facility] Pager [phone] till 3 pm If no answer page [phone] 12/22/2016, 12:10 PM

## 2016-12-22 NOTE — Progress Notes (Signed)
PT Cancellation Note  Patient Details Name: Brandy Huff MRN: [id number] DOB: 06-29-1918   Cancelled Treatment:    Reason Eval/Treat Not Completed: Medical issues which prohibited therapy. Pt agitated and hallucinating. Will try again at a later date.   [name] 12/22/2016, 5:01 PM [employer] PT [phone]

## 2016-12-23 ENCOUNTER — Other Ambulatory Visit (HOSPITAL_COMMUNITY): Payer: Medicare Other

## 2016-12-23 MED ORDER — LORAZEPAM 2 MG/ML IJ SOLN
0.5000 mg | Freq: Once | INTRAMUSCULAR | Status: AC
  Administered 2016-12-23: 0.5 mg via INTRAVENOUS
  Filled 2016-12-23 (×2): qty 1

## 2016-12-23 MED ORDER — LORAZEPAM 2 MG/ML IJ SOLN: 0.2500 mg | Freq: Two times a day (BID) | INTRAMUSCULAR | Status: DC | PRN

## 2016-12-23 MED ORDER — SODIUM CHLORIDE 0.9 % IV SOLN
INTRAVENOUS | Status: AC
  Administered 2016-12-23: 15:00:00 via INTRAVENOUS

## 2016-12-23 NOTE — Evaluation (Signed)
Physical Therapy Evaluation Patient Details Name: Brandy Huff MRN: [id number] DOB: 06-May-1918 Today's Date: 12/23/2016   History of Present Illness  Patient is a 83 yo female admitted 12/21/16 with acute encephalopathy, dehydration, large Lt pleural effusion.     PMH:  mild dementia, Afib, HTN, blindness due to macular degeneration, falls  Clinical Impression  Patient very lethargic due to meds given for agitation per daughter.  Session today limited by level of arousal.  Per daughter, family is planning for patient to return home with 24/7 assist by family.  Recommend HHPT at d/c and w/c.    Follow Up Recommendations Home health PT;Supervision/Assistance - 24 hour    Equipment Recommendations  Wheelchair (measurements PT);Wheelchair cushion (measurements PT)    Recommendations for Other Services       Precautions / Restrictions Precautions Precautions: Fall Precaution Comments: h/o falls at home Restrictions Weight Bearing Restrictions: No      Mobility  Bed Mobility Overal bed mobility: Needs Assistance Bed Mobility: Supine to Sit;Sit to Supine     Supine to sit: Total assist;+2 for physical assistance Sit to supine: Total assist;+2 for physical assistance   General bed mobility comments: Patient very lethargic due to medication per daughter.  Moved patient to seated position.  Initially required max assist to maintain sitting balance.  After sitting 8-9 minutes, patient requiring min assist.  Became slightly more alert in sitting.  Total assist to return to supine.  Transfers                 General transfer comment: Unable   Ambulation/Gait                Stairs            Wheelchair Mobility    Modified Rankin (Stroke Patients Only)       Balance Overall balance assessment: Needs assistance;History of Falls Sitting-balance support: Feet supported;Single extremity supported Sitting balance-Leahy Scale: Poor   Postural control: Right  lateral lean;Posterior lean                                   Pertinent Vitals/Pain Pain Assessment: No/denies pain    Home Living Family/patient expects to be discharged to:: Private residence Living Arrangements: Alone Available Help at Discharge: Family;Available 24 hours/day Type of Home: House Home Access: Stairs to enter Entrance Stairs-Rails: None Entrance Stairs-Number of Steps: 1 Home Layout: One level Home Equipment: Shower seat - built in;Walker - 2 wheels;Bedside commode      Prior Function Level of Independence: Needs assistance   Gait / Transfers Assistance Needed: ambulated with RW with assist prn as pt is blind  ADL's / Homemaking Assistance Needed: assisted for showers and all IADL         Hand Dominance   Dominant Hand: Right    Extremity/Trunk Assessment   Upper Extremity Assessment Upper Extremity Assessment: Generalized weakness;Difficult to assess due to impaired cognition    Lower Extremity Assessment Lower Extremity Assessment: Generalized weakness;Difficult to assess due to impaired cognition    Cervical / Trunk Assessment Cervical / Trunk Assessment: Kyphotic  Communication   Communication: No difficulties  Cognition Arousal/Alertness: Lethargic;Suspect due to medications Behavior During Therapy: Flat affect Overall Cognitive Status: Impaired/Different from baseline  General Comments: Difficult to assess due to lethargy/level of arousal      General Comments      Exercises     Assessment/Plan    PT Assessment Patient needs continued PT services  PT Problem List Decreased strength;Decreased activity tolerance;Decreased balance;Decreased mobility;Decreased cognition;Decreased knowledge of use of DME       PT Treatment Interventions DME instruction;Gait training;Functional mobility training;Therapeutic activities;Therapeutic exercise;Balance training;Patient/family  education;Cognitive remediation    PT Goals (Current goals can be found in the Care Plan section)  Acute Rehab PT Goals Patient Stated Goal: Unable to state PT Goal Formulation: With family Time For Goal Achievement: 12/30/16 Potential to Achieve Goals: Fair    Frequency Min 3X/week   Barriers to discharge        Co-evaluation               AM-PAC PT "6 Clicks" Daily Activity  Outcome Measure Difficulty turning over in bed (including adjusting bedclothes, sheets and blankets)?: Total Difficulty moving from lying on back to sitting on the side of the bed? : Total Difficulty sitting down on and standing up from a chair with arms (e.g., wheelchair, bedside commode, etc,.)?: Total Help needed moving to and from a bed to chair (including a wheelchair)?: A Lot Help needed walking in hospital room?: A Lot Help needed climbing 3-5 steps with a railing? : A Lot 6 Click Score: 9    End of Session   Activity Tolerance: Patient limited by lethargy Patient left: in bed;with call bell/phone within reach;with family/visitor present   PT Visit Diagnosis: Unsteadiness on feet (R26.81);Difficulty in walking, not elsewhere classified (R26.2);History of falling (Z91.81);Muscle weakness (generalized) (M62.81)    Time: 1423-1440 PT Time Calculation (min) (ACUTE ONLY): 17 min   Charges:   PT Evaluation $PT Eval Moderate Complexity: 1 Procedure     PT G Codes:        Carita Pian. Sanjuana Kava, [facility] Acute Rehab Services Pager [place] 12/23/2016, 9:28 PM

## 2016-12-23 NOTE — Care Management Note (Signed)
Case Management Note  Patient Details  Name: TOINI FAILLA MRN: [id number] Date of Birth: 1918-06-30  Subjective/Objective:           Admitted with Acute encephalopathy,  recently hospitalized 10/30/16-11/05/16 for community-acquired pneumonia. Hx of atrial fibrillation, hypertension, arthritis, PVD with right carotid artery occlusion and right eye blindness, breast cancer/s/p R mastectomy, colon cancer/ colectomy and history of chemotherapy.    PCP: Lanelle Bal Prevett (Daughter) Shary Decamp (Daughter)    [phone]      Action/Plan: Discharge planning in process..... PT evaluation pending.... CM to f/u with disposition needs.  Expected Discharge Date:                  Expected Discharge Plan:  [place]  In-House Referral:  Clinical Social Work  Discharge planning Services  CM Consult  Post Acute Care Choice:    Choice offered to:     DME Arranged:    DME Agency:     HH Arranged:    [facility] Agency:     Status of Service:  In process, will continue to follow  If discussed at Long Length of Stay Meetings, dates discussed:    Additional Comments:  Sharin Mons, RN 12/23/2016, 9:03 PM

## 2016-12-23 NOTE — Progress Notes (Signed)
Pt is not awake enough to take oral medication. Will continue to monitor and attempt again later.

## 2016-12-23 NOTE — Progress Notes (Signed)
PROGRESS NOTE   CARMALITA WAKEFIELD  ZOX:[id number]    DOB: November 18, 1917    DOA: 12/21/2016  PCP: [name], MD   I have briefly reviewed patients previous medical records in [facility].  Brief Narrative:  83 year old female, lives alone, ambulates with the help of a walker, has 24/7 supervision and assistance from her multiple children, at baseline mild dementia but mostly coherent, PMH of A. fib-not on anticoagulation, HTN, hypothyroid, right eye corneal opacity/blindness, bilateral CEA, breast and colon cancer, dementia, hospitalized 10/30/16-11/05/16 for community-acquired pneumonia, did well post discharge, evaluated by PCP couple of weeks later and follow-up chest x-ray showed left pleural effusion, sent to IR for thoracentesis on 4/25 but due to markedly elevated blood pressures (210/110) and not enough fluid, thoracentesis was not done, followed up with PCP as prior to admission and prescribed levofloxacin, after the first dose noted to be extremely confused, hallucinating, agitated which progressively got worse, was admitted to the hospital for further evaluation and management. Pulmonology was consulted but holding off thoracentesis until patient mental status improves, blood pressure control is better and not acutely symptomatic from effusion.   Assessment & Plan:   Principal Problem:   Acute encephalopathy Active Problems:   History of total colectomy - after colon Ca x 2   Community acquired pneumonia   Essential hypertension   Hypothyroidism   Malignant hypertension   Hypokalemia   Pleural effusion associated with pulmonary infection   Falls   1. Acute confusional state/acute encephalopathy: Most likely secondary to recent use of levofloxacin, given temporal relationship to initiation of medication, complicating underlying dementia. Less likely due to malignant hypertension. Apparently had similar presentation when ciprofloxacin was used for UTI in the past. Levofloxacin  discontinued. CT head without acute findings. No focal deficits on clinical exam. Haldol when necessary >apparently did not help enough and then settled after Ativan. 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 Avoid opioids, or other psychotropic medications. Urine microscopy not indicative off UTI. When necessary low-dose Ativan. 2. Large left pleural effusion: Possibly parapneumonic versus other etiologies. In the absence of fever, leukocytosis or symptoms, low index of suspicion for infectious etiology. Did receive a dose of cefepime and vancomycin in the ED. As discussed with PCCM MD, hold off on antibiotics and monitor. Pro-calcitonin <0.1. Currently not symptomatic. Pulmonology follow-up appreciated, etiology of pleural effusion not certain, could be from undiagnosed CHF (however no other features of CHF or peripheral edema), malignant from prior history of breast/colon cancer in remission or inflammatory. Discussed in detail with daughter at bedside, leaning towards no intervention unless patient becomes symptomatic which is a prudent option in this elderly frail lady. 3. Dehydration with hyponatremia: Possibly related to poor oral intake. Improved. Continue gentle IV normal saline. 4. Hypokalemia: Replaced. 5. Essential hypertension: Poorly controlled. Continue ARB and metoprolol. When necessary IV hydralazine. Added low-dose amlodipine. 6. Hypothyroid: Continue Synthroid. TSH mildly elevated. Follow-up TSH in 4 weeks. 7. Paroxysmal A. fib: Currently in sinus rhythm. Continue metoprolol. Not candidate for anticoagulation. 8. Dementia: Management per problem #1   DVT prophylaxis: Lovenox Code Status: Full code. I have discussed with family regarding revisiting this and awaiting response from them. Family Communication: Discussed in detail with patient's daughter at bedside. Disposition: DC home when medically stable.   Consultants:  Pulmonology.   Procedures:  None.  Antimicrobials:  IV vancomycin  and cefepime-discontinued.    Subjective: Patient somnolent/sedated this morning. As per report from patient's daughter at bedside, she took a nap between 10 PM-midnight last  night then was again up and agitated until 4 AM and then went to sleep after medications.   ROS: No fever or chills reported.  Objective:  Vitals:   12/23/16 0900 12/23/16 1113 12/23/16 1257 12/23/16 1437  BP: 138/70 (!) 157/59 (!) 175/89 (!) 170/65  Pulse: 71  81 89  Resp:    18  Temp:    98.6 F (37 C)  TempSrc:      SpO2:      Weight:      Height:      Oxygen saturation 98%.  Examination:  General exam: Pleasant elderly female, small built and frail, chronically ill-looking, lying comfortably propped up in bed. Respiratory system: Reduced breath sounds in left lung fields. Rest of lung fields clear to auscultation without wheezing, rhonchi or crackles. Respiratory effort normal. Cardiovascular system: S1 & S2 heard, RRR. No JVD, murmurs, rubs, gallops or clicks. No pedal edema. Gastrointestinal system: Abdomen is nondistended, soft and nontender. No organomegaly or masses felt. Normal bowel sounds heard. Central nervous system: Somnolent and barely opens eyes to call. No focal neurological deficits. Right corneal opacity Extremities: Symmetric 5 x 5 power. Superficial bruising of left shin. Skin: No rashes, lesions or ulcers Psychiatry: Judgement and insight impaired.    Data Reviewed: I have personally reviewed following labs and imaging studies  CBC:  Recent Labs Lab 12/21/16 0719 12/21/16 1148 12/22/16 0347  WBC 6.4 8.8 6.8  HGB 14.4 14.1 13.9  HCT 42.0 41.7 40.6  MCV 92.1 92.5 92.3  PLT 182 214 295   Basic Metabolic Panel:  Recent Labs Lab 12/21/16 0719 12/21/16 1148 12/22/16 0807  NA 129*  --  135  K 3.2*  --  3.6  CL 96*  --  100*  CO2 23  --  27  GLUCOSE 114*  --  112*  BUN 12  --  <5*  CREATININE 0.75 0.69 0.61  CALCIUM 9.3  --  8.9  MG  --  1.7  --    Liver Function  Tests:  Recent Labs Lab 12/21/16 0719 12/22/16 0807  AST 20 17  ALT 11* 10*  ALKPHOS 61 55  BILITOT 1.6* 1.2  PROT 6.6 6.0*  ALBUMIN 3.6 3.2*     Radiology Studies: No results found.      Scheduled Meds: . amLODipine  5 mg Oral Daily  . enoxaparin (LOVENOX) injection  40 mg Subcutaneous Q24H  . irbesartan  150 mg Oral Daily  . levothyroxine  112 mcg Oral QAC breakfast  . metoprolol succinate  50 mg Oral Daily  . sodium chloride flush  3 mL Intravenous Q12H   Continuous Infusions:    LOS: 2 days     Achillies Buehl, MD, FACP, FHM. [facility] Pager [phone]  If 7PM-7AM, please contact night-coverage [email or web address] Password [email or web address] 12/23/2016, 2:53 PM

## 2016-12-24 ENCOUNTER — Inpatient Hospital Stay (HOSPITAL_COMMUNITY): Payer: Medicare Other

## 2016-12-24 DIAGNOSIS — I359 Nonrheumatic aortic valve disorder, unspecified: Secondary | ICD-10-CM

## 2016-12-24 LAB — ECHOCARDIOGRAM COMPLETE
HEIGHTINCHES: 62 in
WEIGHTICAEL: 2304 [oz_av]

## 2016-12-24 LAB — PROCALCITONIN

## 2016-12-24 MED ORDER — AMLODIPINE BESYLATE 5 MG PO TABS: 5.0000 mg | ORAL_TABLET | Freq: Every day | ORAL | 0 refills | Status: DC

## 2016-12-24 MED ORDER — METOPROLOL SUCCINATE ER 25 MG PO TB24: 50.0000 mg | ORAL_TABLET | Freq: Every day | ORAL | 0 refills | Status: DC

## 2016-12-24 NOTE — Progress Notes (Signed)
  Echocardiogram 2D Echocardiogram has been performed.  Brandy Huff 12/24/2016, 9:38 AM

## 2016-12-24 NOTE — Progress Notes (Signed)
Physical Therapy Treatment Patient Details Name: Brandy Huff MRN: [id number] DOB: 09-Feb-1918 Today's Date: 12/24/2016    History of Present Illness Patient is a 83 yo female admitted 12/21/16 with acute encephalopathy, dehydration, large Lt pleural effusion.     PMH:  mild dementia, Afib, HTN, blindness due to macular degeneration, falls    PT Comments    Pt performed sit to stand trials and seated exercise.  Pt remains to fatigue quickly but motivated to get out of bed.  Pt hesistant to walk but agreeable to sit to stand trials and exercise.  Pt tolerated tx well.     Follow Up Recommendations  Home health PT;Supervision/Assistance - 24 hour     Equipment Recommendations  Wheelchair (measurements PT);Wheelchair cushion (measurements PT)    Recommendations for Other Services       Precautions / Restrictions Precautions Precautions: Fall Precaution Comments: h/o falls at home Restrictions Weight Bearing Restrictions: No    Mobility  Bed Mobility Overal bed mobility: Needs Assistance Bed Mobility: Supine to Sit;Sit to Supine     Supine to sit: Mod assist Sit to supine: Min assist   General bed mobility comments: Cues for LE and hand placement to achieve sitting edge of bed.  Pt required assistance for forward weight shifting.  Able to lift B LEs against gravity for back to bed transfer.    Transfers Overall transfer level: Needs assistance Equipment used: Rolling walker (2 wheeled) Transfers: Sit to/from Stand Sit to Stand: Min assist         General transfer comment: Cues for hand placement to and from seated surface.  Pt able to perform sit to stand x2 trials.  Pt able to tolerate standing x 40 sec each trial.  Mild posterior lean with intermittent jerking of hands.    Ambulation/Gait Ambulation/Gait assistance:  (refused.  )               Stairs            Wheelchair Mobility    Modified Rankin (Stroke Patients Only)       Balance Overall  balance assessment: Needs assistance;History of Falls Sitting-balance support: Feet supported;Single extremity supported Sitting balance-Leahy Scale: Fair       Standing balance-Leahy Scale: Poor                              Cognition Arousal/Alertness: Lethargic;Suspect due to medications Behavior During Therapy: Flat affect Overall Cognitive Status: Impaired/Different from baseline                                 General Comments: Difficult to assess due to lethargy/level of arousal      Exercises General Exercises - Lower Extremity Long Arc Quad: AROM;Both;10 reps;Seated Hip Flexion/Marching: AROM;Both;10 reps;Seated    General Comments        Pertinent Vitals/Pain Pain Assessment: No/denies pain    Home Living                      Prior Function            PT Goals (current goals can now be found in the care plan section) Acute Rehab PT Goals Patient Stated Goal: Unable to state Potential to Achieve Goals: Fair Progress towards PT goals: Progressing toward goals    Frequency    Min 3X/week  PT Plan Current plan remains appropriate    Co-evaluation              AM-PAC PT "6 Clicks" Daily Activity  Outcome Measure  Difficulty turning over in bed (including adjusting bedclothes, sheets and blankets)?: Total Difficulty moving from lying on back to sitting on the side of the bed? : Total Difficulty sitting down on and standing up from a chair with arms (e.g., wheelchair, bedside commode, etc,.)?: Total Help needed moving to and from a bed to chair (including a wheelchair)?: A Little Help needed walking in hospital room?: A Lot Help needed climbing 3-5 steps with a railing? : A Lot 6 Click Score: 10    End of Session Equipment Utilized During Treatment: Gait belt Activity Tolerance: Patient limited by lethargy Patient left: in bed;with call bell/phone within reach;with family/visitor present Nurse  Communication: Mobility status PT Visit Diagnosis: Unsteadiness on feet (R26.81);Difficulty in walking, not elsewhere classified (R26.2);History of falling (Z91.81);Muscle weakness (generalized) (M62.81)     Time: 0272-5366 PT Time Calculation (min) (ACUTE ONLY): 25 min  Charges:  $Therapeutic Exercise: 8-22 mins $Therapeutic Activity: 8-22 mins                    G Codes:       Governor Rooks, PTA pager [phone]    Cristela Blue 12/24/2016, 3:34 PM

## 2016-12-24 NOTE — Discharge Summary (Signed)
Physician Discharge Summary  Brandy Huff GNO:[id number] DOB: 05/12/1918  PCP: [name], MD  Admit date: 12/21/2016 Discharge date: 12/24/2016  Recommendations for Outpatient Follow-up:  1. Dr. [name], PCP in 3 days. Please follow final blood culture results that were sent from the hospital.  Home Health: PT Equipment/Devices: Wheelchair and wheelchair cushion    Discharge Condition: Improved and stable  CODE STATUS: Full  Diet recommendation: Heart healthy diet  Discharge Diagnoses:  Principal Problem:   Acute encephalopathy Active Problems:   History of total colectomy - after colon Ca x 2   Community acquired pneumonia   Essential hypertension   Hypothyroidism   Malignant hypertension   Hypokalemia   Pleural effusion associated with pulmonary infection   Falls   Brief Summary: 83 year old female, lives alone, ambulates with the help of a walker, has 24/7 supervision and assistance from her multiple children, at baseline mild dementia but mostly coherent, PMH of A. fib-not on anticoagulation, HTN, hypothyroid, right eye corneal opacity/blindness, bilateral CEA, breast and colon cancer, dementia, hospitalized 10/30/16-11/05/16 for community-acquired pneumonia, did well post discharge, evaluated by PCP couple of weeks later and follow-up chest x-ray showed left pleural effusion, sent to IR for thoracentesis on 4/25 but due to markedly elevated blood pressures (210/110) and not enough fluid, thoracentesis was not done, followed up with PCP as prior to admission and prescribed levofloxacin, after the first dose noted to be extremely confused, hallucinating, agitated which progressively got worse, was admitted to the hospital for further evaluation and management. Pulmonology consulted and signed off. Mental status improved. As per family's decision, no thoracentesis unless patient becomes symptomatic.   Assessment & Plan:   1. Acute confusional state/acute  encephalopathy: Most likely secondary to recent use of levofloxacin, given temporal relationship to initiation of medication, complicating underlying dementia. Less likely due to malignant hypertension. Apparently had similar presentation when ciprofloxacin was used for UTI in the past. Levofloxacin discontinued. Quinolones added to her list of allergies. CT head without acute findings. No focal deficits on clinical exam. Haldol when necessary > did not help. Improved after single dose of Ativan on 5/6 early morning. Avoid opioids, or other psychotropic medications. Urine microscopy not indicative off UTI. As per family, mental status almost back to baseline. 2. Large left pleural effusion: Possibly parapneumonic versus other etiologies. In the absence of fever, leukocytosis or symptoms, low index of suspicion for infectious etiology. Did receive a dose of cefepime and vancomycin in the ED. As discussed with PCCM MD, hold off on antibiotics and monitor. Pro-calcitonin <0.1. Currently not symptomatic. Pulmonology follow-up appreciated, etiology of pleural effusion not certain, could be from undiagnosed CHF (however no other features of CHF or peripheral edema), malignant from prior history of breast/colon cancer in remission or inflammatory. 2-D echo results appreciated and as below, unremarkable. As discussed with daughter and 2 sons yesterday and today, they do not wish to pursue thoracentesis unless patient becomes symptomatic of dyspnea. 3. Dehydration with hyponatremia: Possibly related to poor oral intake. Improved after gentle IV normal saline hydration. 4. Hypokalemia: Replaced. 5. Essential hypertension: Poorly controlled. Continue Irbesartan. Toprol-XL increased to 50 MG daily and amlodipine 5 MG daily added. Better but not optimal. Follow-up with PCP regarding further management. 6. Hypothyroid: Continue Synthroid. TSH mildly elevated. Follow-up TSH in 4 weeks. 7. Paroxysmal A. fib: Currently in  sinus rhythm. Continue metoprolol. Not candidate for anticoagulation. 8. Dementia: Management per problem #1   Consultants:  Pulmonology.   Procedures:  2-D echo 12/24/16: Study  Conclusions  - Left ventricle: The cavity size was normal. Systolic function was normal. The estimated ejection fraction was in the range of 60% to 65%. Images were inadequate for LV wall motion assessment. There was an increased relative contribution of atrial contraction to ventricular filling. Doppler parameters are consistent with abnormal left ventricular relaxation (grade 1 diastolic dysfunction). - Aortic valve: Poorly visualized. There was mild regurgitation. - Mitral valve: Poorly visualized. Calcified annulus. - Left atrium: Poorly visualized. - Right ventricle: Poorly visualized. - Right atrium: Poorly visualized. - Pulmonic valve: Poorly visualized. - Pulmonary arteries: Poorly visualized. Systolic pressure could not be accurately estimated.   Discharge Instructions  Discharge Instructions    Call MD for:    Complete by:  As directed    Recurrent confusion or altered mental status.   Call MD for:  difficulty breathing, headache or visual disturbances    Complete by:  As directed    Call MD for:  extreme fatigue    Complete by:  As directed    Call MD for:  persistant dizziness or light-headedness    Complete by:  As directed    Call MD for:  temperature >100.4    Complete by:  As directed    Diet - low sodium heart healthy    Complete by:  As directed    Increase activity slowly    Complete by:  As directed        Medication List    TAKE these medications   amLODipine 5 MG tablet Commonly known as:  NORVASC Take 1 tablet (5 mg total) by mouth daily. Start taking on:  12/25/2016   levothyroxine 112 MCG tablet Commonly known as:  SYNTHROID, LEVOTHROID Take 112 mcg by mouth daily before breakfast.   metoprolol succinate 25 MG 24 hr tablet Commonly known as:   TOPROL-XL Take 2 tablets (50 mg total) by mouth daily. What changed:  how much to take   valsartan 160 MG tablet Commonly known as:  DIOVAN Take 160 mg by mouth daily.      Follow-up Information    [name], MD. Schedule an appointment as soon as possible for a visit in 3 day(s).   Specialty:  Internal Medicine Contact information: [address] [place] [address] [phone]          Allergies  Allergen Reactions  . Quinolones Other (See Comments)    AMS > noted with Levofloxacin and Cipro  . Levaquin [Levofloxacin In D5w] Other (See Comments)    Altered Mental Status, admitted to hospital with CNS toxicity      Procedures/Studies:  Ct Head Wo Contrast  Result Date: 12/21/2016 CLINICAL DATA:  Acute encephalopathy EXAM: CT HEAD WITHOUT CONTRAST TECHNIQUE: Contiguous axial images were obtained from the base of the skull through the vertex without intravenous contrast. COMPARISON:  None. FINDINGS: Brain: Diffuse cerebral atrophy. Extensive low-density throughout the deep white matter. No acute intracranial abnormality. Specifically, no hemorrhage, hydrocephalus, mass lesion, acute infarction, or significant intracranial injury. Vascular: No hyperdense vessel or unexpected calcification. Skull: No acute calvarial abnormality. Sinuses/Orbits: Opacified left maxillary sinus. Remainder the paranasal sinuses are clear. Mastoid air cells are clear. Orbital soft tissues unremarkable. Other: None IMPRESSION: Atrophy, and extensive chronic small vessel disease changes. No acute intracranial abnormality. Electronically Signed   By: Rolm Baptise M.D.   On: 12/21/2016 10:53   Ct Chest W Contrast  Result Date: 12/21/2016 CLINICAL DATA:  Nonverbal and confused. Pneumonia. Altered mental status EXAM: CT  CHEST WITH CONTRAST TECHNIQUE: Multidetector CT imaging of the chest was performed during intravenous contrast administration. CONTRAST:  Seventy-five ISOVUE-300  IOPAMIDOL (ISOVUE-300) INJECTION 61% COMPARISON:  Chest radiograph 12/21/2016 FINDINGS: Cardiovascular: The ascending aorta is mildly enlarged at 40 mm diameter. Intimal calcifications present knee aortic arch. Coronary calcifications present. No pericardial fluid. Mediastinum/Nodes: No axillary or supraclavicular adenopathy. Mediastinal hilar adenopathy. Esophagus normal. Lungs/Pleura: There is a large layering LEFT pleural effusion which occupies approximately 1/2 of the LEFT hemithorax volume. There is passive atelectasis of the LEFT lower lobe. No airspace disease within the LEFT lung. No pulmonary or mediastinal mass identified. No RIGHT effusion. There is bronchiectasis and peribronchial thickening along the RIGHT lower lobe bronchi and bronchioles with mild atelectasis. No airspace disease. Upper Abdomen: Low-density lesion in the LEFT hepatic lobe likely represent cysts or hemangioma. Adrenal glands normal. Musculoskeletal: No aggressive osseous lesion. IMPRESSION: 1. Large depend LEFT pleural effusion occupying approximate half of the hemithorax volume. Consider thoracentesis with cytology evaluation. 2. No evidence of malignancy.  Mild LEFT basilar atelectasis. 3. Peribronchial thickening in the RIGHT lower lobe suggests acute or chronic bronchitis. 4. Mild dilatation of the ascending aorta to 40 mm. 5. Coronary artery calcification and aortic atherosclerotic calcification. Electronically Signed   By: Suzy Bouchard M.D.   On: 12/21/2016 11:05   Dg Chest Port 1 View  Result Date: 12/21/2016 CLINICAL DATA:  Altered mental status.  Recent pneumonia EXAM: PORTABLE CHEST 1 VIEW COMPARISON:  December 17, 2016 FINDINGS: There is airspace consolidation in the left lower lobe with left pleural effusion. Right lung is clear. There is cardiomegaly with pulmonary vascularity within normal limits. There is aortic atherosclerosis. No adenopathy. The patient is status post right mastectomy with surgical clips in the  right axillary region. No evident bone lesions. IMPRESSION: Persistent left lower lobe airspace consolidation with left effusion. No new opacity. Stable cardiac prominence. Aortic atherosclerosis. No adenopathy. Electronically Signed   By: Lowella Grip III M.D.   On: 12/21/2016 07:21     Subjective: Patient is awake and alert, oriented 2. States that she feels "fine". Denies complaints. As per family, 2 sons at bedside, mental status almost back to normal. As per daughter, patient ate well for lunch and also feels that her mental status has significantly improved. Daughter feels comfortable taking patient home.  Discharge Exam:  Vitals:   12/23/16 2143 12/24/16 0521 12/24/16 0941 12/24/16 1411  BP: (!) 122/52 (!) 168/89 (!) 153/84 (!) 161/74  Pulse: 69 78 91 84  Resp: 18 18    Temp: 97.3 F (36.3 C) 97.4 F (36.3 C)  98.1 F (36.7 C)  TempSrc: Oral Oral  Oral  SpO2: 100% 100%  98%  Weight:      Height:        General exam: Pleasant elderly female, small built and frail, chronically ill-looking, lying comfortably propped up in bed. Respiratory system: Reduced breath sounds in left lung fields. Rest of lung fields clear to auscultation without wheezing, rhonchi or crackles. Respiratory effort normal. Cardiovascular system: S1 & S2 heard, RRR. No JVD, murmurs, rubs, gallops or clicks. No pedal edema. Gastrointestinal system: Abdomen is nondistended, soft and nontender. No organomegaly or masses felt. Normal bowel sounds heard. Central nervous system: Alert and oriented 2. No focal neurological deficits. Right corneal opacity Extremities: Symmetric 5 x 5 power. Superficial bruising of left shin. Skin: No rashes, lesions or ulcers Psychiatry: Judgement and insight impaired.    The results of significant diagnostics from this hospitalization (including  imaging, microbiology, ancillary and laboratory) are listed below for reference.     Microbiology: Recent Results (from the  past 240 hour(s))  MRSA PCR Screening     Status: None   Collection Time: 12/22/16  5:47 PM  Result Value Ref Range Status   MRSA by PCR NEGATIVE NEGATIVE Final    Comment:        The GeneXpert MRSA Assay (FDA approved for NASAL specimens only), is one component of a comprehensive MRSA colonization surveillance program. It is not intended to diagnose MRSA infection nor to guide or monitor treatment for MRSA infections.   Culture, blood (Routine X 2) w Reflex to ID Panel     Status: None (Preliminary result)   Collection Time: 12/22/16  7:00 PM  Result Value Ref Range Status   Specimen Description BLOOD RIGHT ARM  Final   Special Requests   Final    BOTTLES DRAWN AEROBIC AND ANAEROBIC Blood Culture adequate volume   Culture NO GROWTH 2 DAYS  Final   Report Status PENDING  Incomplete  Culture, blood (Routine X 2) w Reflex to ID Panel     Status: None (Preliminary result)   Collection Time: 12/22/16  7:05 PM  Result Value Ref Range Status   Specimen Description BLOOD RIGHT HAND  Final   Special Requests IN PEDIATRIC BOTTLE Blood Culture adequate volume  Final   Culture NO GROWTH 2 DAYS  Final   Report Status PENDING  Incomplete     Labs: CBC:  Recent Labs Lab 12/21/16 0719 12/21/16 1148 12/22/16 0347  WBC 6.4 8.8 6.8  HGB 14.4 14.1 13.9  HCT 42.0 41.7 40.6  MCV 92.1 92.5 92.3  PLT 182 214 277   Basic Metabolic Panel:  Recent Labs Lab 12/21/16 0719 12/21/16 1148 12/22/16 0807  NA 129*  --  135  K 3.2*  --  3.6  CL 96*  --  100*  CO2 23  --  27  GLUCOSE 114*  --  112*  BUN 12  --  <5*  CREATININE 0.75 0.69 0.61  CALCIUM 9.3  --  8.9  MG  --  1.7  --    Liver Function Tests:  Recent Labs Lab 12/21/16 0719 12/22/16 0807  AST 20 17  ALT 11* 10*  ALKPHOS 61 55  BILITOT 1.6* 1.2  PROT 6.6 6.0*  ALBUMIN 3.6 3.2*   Thyroid function studies  Recent Labs  12/22/16 1859  TSH 4.714*   Urinalysis    Component Value Date/Time   COLORURINE STRAW (A)  12/21/2016 0907   APPEARANCEUR CLEAR 12/21/2016 0907   LABSPEC 1.008 12/21/2016 0907   PHURINE 8.0 12/21/2016 0907   GLUCOSEU 50 (A) 12/21/2016 0907   HGBUR NEGATIVE 12/21/2016 0907   [place] NEGATIVE 12/21/2016 0907   KETONESUR NEGATIVE 12/21/2016 0907   PROTEINUR NEGATIVE 12/21/2016 0907   UROBILINOGEN 0.2 09/09/2016 1811   NITRITE NEGATIVE 12/21/2016 0907   LEUKOCYTESUR NEGATIVE 12/21/2016 4128   Discussed in detail with patient's 2 sons at bedside and subsequently with daughter via phone. Updated care and answered questions.   Time coordinating discharge: Over 30 minutes  SIGNED:  Vernell Leep, MD, FACP, [place]. [facility] Pager [phone]  If 7PM-7AM, please contact night-coverage [email or web address] Password [facility] 12/24/2016, 4:51 PM

## 2016-12-24 NOTE — Progress Notes (Signed)
Delray Alt Pretty to be D/C'd to home with home health PT per MD order.  Discussed with the patient and all questions fully answered.  VSS, Skin clean, dry and intact without evidence of skin break down, no evidence of skin tears noted. IV catheter discontinued intact. Site without signs and symptoms of complications. Dressing and pressure applied.  An After Visit Summary was printed and given to the patient. Patient received prescriptions.  D/c education completed with patient/family including follow up instructions, medication list, d/c activities limitations if indicated, with other d/c instructions as indicated by MD - patient able to verbalize understanding, all questions fully answered.   Patient instructed to return to ED, call 911, or call MD for any changes in condition.   Patient escorted via [place], and D/C home via private auto.  Morley Kos Price 12/24/2016 6:45 PM

## 2016-12-27 LAB — CULTURE, BLOOD (ROUTINE X 2)
CULTURE: NO GROWTH
Culture: NO GROWTH
SPECIAL REQUESTS: ADEQUATE
Special Requests: ADEQUATE

## 2017-01-17 ENCOUNTER — Institutional Professional Consult (permissible substitution): Payer: Medicare Other | Admitting: Pulmonary Disease

## 2017-03-06 ENCOUNTER — Ambulatory Visit: Payer: Medicare Other | Admitting: Podiatry

## 2017-03-28 ENCOUNTER — Ambulatory Visit (INDEPENDENT_AMBULATORY_CARE_PROVIDER_SITE_OTHER): Payer: Medicare Other | Admitting: Podiatry

## 2017-03-28 ENCOUNTER — Encounter: Payer: Self-pay | Admitting: Podiatry

## 2017-03-28 DIAGNOSIS — B351 Tinea unguium: Secondary | ICD-10-CM | POA: Diagnosis not present

## 2017-03-28 DIAGNOSIS — M79674 Pain in right toe(s): Secondary | ICD-10-CM

## 2017-03-28 DIAGNOSIS — M79675 Pain in left toe(s): Secondary | ICD-10-CM

## 2017-03-28 NOTE — Progress Notes (Signed)
Complaint:  Visit Type: Patient returns to my office for continued preventative foot care services. Complaint: Patient states" my nails have grown long and thick and become painful to walk and wear shoes"  The patient presents for preventative foot care services. No changes to ROS  Podiatric Exam: Vascular: dorsalis pedis and posterior tibial pulses are palpable bilateral. Capillary return is immediate. Temperature gradient is WNL. Skin turgor WNL  Sensorium: Normal Semmes Weinstein monofilament test. Normal tactile sensation bilaterally. Nail Exam: Pt has thick disfigured discolored nails with subungual debris noted bilateral entire nail hallux through fifth toenails Ulcer Exam: There is no evidence of ulcer or pre-ulcerative changes or infection. Orthopedic Exam: Muscle tone and strength are WNL. No limitations in general ROM. No crepitus or effusions noted.  HAV  B/L. Skin: No Porokeratosis. No infection or ulcers.  Atropic skin noted.  Diagnosis:  Onychomycosis, , Pain in right toe, pain in left toes  Treatment & Plan Procedures and Treatment: Consent by patient was obtained for treatment procedures. The patient understood the discussion of treatment and procedures well. All questions were answered thoroughly reviewed. Debridement of mycotic and hypertrophic toenails, 1 through 5 bilateral and clearing of subungual debris. No ulceration, no infection noted.  Return Visit-Office Procedure: Patient instructed to return to the office for a follow up visit 3 months for continued evaluation and treatment.    Hameed Kolar DPM 

## 2017-07-17 ENCOUNTER — Encounter: Payer: Self-pay | Admitting: Podiatry

## 2017-07-17 ENCOUNTER — Ambulatory Visit: Payer: Medicare Other | Admitting: Podiatry

## 2017-07-17 DIAGNOSIS — B351 Tinea unguium: Secondary | ICD-10-CM

## 2017-07-17 DIAGNOSIS — M79674 Pain in right toe(s): Secondary | ICD-10-CM

## 2017-07-17 DIAGNOSIS — M79675 Pain in left toe(s): Secondary | ICD-10-CM | POA: Diagnosis not present

## 2017-07-17 NOTE — Progress Notes (Signed)
Complaint:  Visit Type: Patient returns to my office for continued preventative foot care services. Complaint: Patient states" my nails have grown long and thick and become painful to walk and wear shoes"  The patient presents for preventative foot care services. No changes to ROS  Podiatric Exam: Vascular: dorsalis pedis and posterior tibial pulses are palpable bilateral. Capillary return is immediate. Temperature gradient is WNL. Skin turgor WNL  Sensorium: Normal Semmes Weinstein monofilament test. Normal tactile sensation bilaterally. Nail Exam: Pt has thick disfigured discolored nails with subungual debris noted bilateral entire nail hallux through fifth toenails Ulcer Exam: There is no evidence of ulcer or pre-ulcerative changes or infection. Orthopedic Exam: Muscle tone and strength are WNL. No limitations in general ROM. No crepitus or effusions noted.  HAV  B/L. Skin: No Porokeratosis. No infection or ulcers.  Atropic skin noted.  Diagnosis:  Onychomycosis, , Pain in right toe, pain in left toes  Treatment & Plan Procedures and Treatment: Consent by patient was obtained for treatment procedures. The patient understood the discussion of treatment and procedures well. All questions were answered thoroughly reviewed. Debridement of mycotic and hypertrophic toenails, 1 through 5 bilateral and clearing of subungual debris. No ulceration, no infection noted.  Return Visit-Office Procedure: Patient instructed to return to the office for a follow up visit 4 months for continued evaluation and treatment.    Gardiner Barefoot DPM

## 2017-09-10 ENCOUNTER — Emergency Department (HOSPITAL_COMMUNITY): Payer: Medicare Other

## 2017-09-10 ENCOUNTER — Inpatient Hospital Stay (HOSPITAL_COMMUNITY)
Admission: EM | Admit: 2017-09-10 | Discharge: 2017-09-11 | DRG: 064 | Disposition: A | Discharge status: Home Health | Payer: Medicare Other | Attending: Family Medicine | Admitting: Family Medicine

## 2017-09-10 DIAGNOSIS — Z9049 Acquired absence of other specified parts of digestive tract: Secondary | ICD-10-CM

## 2017-09-10 DIAGNOSIS — I480 Paroxysmal atrial fibrillation: Secondary | ICD-10-CM | POA: Diagnosis not present

## 2017-09-10 DIAGNOSIS — I639 Cerebral infarction, unspecified: Secondary | ICD-10-CM | POA: Diagnosis present

## 2017-09-10 DIAGNOSIS — R531 Weakness: Secondary | ICD-10-CM

## 2017-09-10 DIAGNOSIS — E039 Hypothyroidism, unspecified: Secondary | ICD-10-CM | POA: Diagnosis present

## 2017-09-10 DIAGNOSIS — R29705 NIHSS score 5: Secondary | ICD-10-CM | POA: Diagnosis present

## 2017-09-10 DIAGNOSIS — Z881 Allergy status to other antibiotic agents status: Secondary | ICD-10-CM

## 2017-09-10 DIAGNOSIS — Z9011 Acquired absence of right breast and nipple: Secondary | ICD-10-CM

## 2017-09-10 DIAGNOSIS — G9340 Encephalopathy, unspecified: Secondary | ICD-10-CM

## 2017-09-10 DIAGNOSIS — H3530 Unspecified macular degeneration: Secondary | ICD-10-CM | POA: Diagnosis not present

## 2017-09-10 DIAGNOSIS — Z809 Family history of malignant neoplasm, unspecified: Secondary | ICD-10-CM

## 2017-09-10 DIAGNOSIS — Z85038 Personal history of other malignant neoplasm of large intestine: Secondary | ICD-10-CM

## 2017-09-10 DIAGNOSIS — R471 Dysarthria and anarthria: Secondary | ICD-10-CM | POA: Diagnosis not present

## 2017-09-10 DIAGNOSIS — Z91048 Other nonmedicinal substance allergy status: Secondary | ICD-10-CM

## 2017-09-10 DIAGNOSIS — Z96653 Presence of artificial knee joint, bilateral: Secondary | ICD-10-CM | POA: Diagnosis present

## 2017-09-10 DIAGNOSIS — I10 Essential (primary) hypertension: Secondary | ICD-10-CM

## 2017-09-10 DIAGNOSIS — Z7982 Long term (current) use of aspirin: Secondary | ICD-10-CM

## 2017-09-10 DIAGNOSIS — Z8249 Family history of ischemic heart disease and other diseases of the circulatory system: Secondary | ICD-10-CM

## 2017-09-10 DIAGNOSIS — F0390 Unspecified dementia without behavioral disturbance: Secondary | ICD-10-CM | POA: Diagnosis not present

## 2017-09-10 DIAGNOSIS — Z66 Do not resuscitate: Secondary | ICD-10-CM | POA: Diagnosis present

## 2017-09-10 DIAGNOSIS — R29810 Facial weakness: Secondary | ICD-10-CM | POA: Diagnosis not present

## 2017-09-10 DIAGNOSIS — G9341 Metabolic encephalopathy: Secondary | ICD-10-CM | POA: Diagnosis not present

## 2017-09-10 DIAGNOSIS — Z720 Tobacco use: Secondary | ICD-10-CM | POA: Diagnosis not present

## 2017-09-10 DIAGNOSIS — F05 Delirium due to known physiological condition: Secondary | ICD-10-CM

## 2017-09-10 DIAGNOSIS — R4701 Aphasia: Secondary | ICD-10-CM | POA: Diagnosis not present

## 2017-09-10 DIAGNOSIS — Z853 Personal history of malignant neoplasm of breast: Secondary | ICD-10-CM

## 2017-09-10 DIAGNOSIS — Z79890 Hormone replacement therapy (postmenopausal): Secondary | ICD-10-CM

## 2017-09-10 DIAGNOSIS — Z888 Allergy status to other drugs, medicaments and biological substances status: Secondary | ICD-10-CM

## 2017-09-10 DIAGNOSIS — Z79899 Other long term (current) drug therapy: Secondary | ICD-10-CM

## 2017-09-10 DIAGNOSIS — H547 Unspecified visual loss: Secondary | ICD-10-CM | POA: Diagnosis present

## 2017-09-10 DIAGNOSIS — Z85828 Personal history of other malignant neoplasm of skin: Secondary | ICD-10-CM

## 2017-09-10 DIAGNOSIS — Z90710 Acquired absence of both cervix and uterus: Secondary | ICD-10-CM

## 2017-09-10 DIAGNOSIS — I6381 Other cerebral infarction due to occlusion or stenosis of small artery: Principal | ICD-10-CM | POA: Diagnosis present

## 2017-09-10 LAB — URINALYSIS, ROUTINE W REFLEX MICROSCOPIC
Bilirubin Urine: NEGATIVE
Glucose, UA: NEGATIVE mg/dL
Ketones, ur: NEGATIVE mg/dL
Nitrite: POSITIVE — AB
Protein, ur: NEGATIVE mg/dL
Specific Gravity, Urine: 1.006 (ref 1.005–1.030)
pH: 7 (ref 5.0–8.0)

## 2017-09-10 LAB — I-STAT CHEM 8, ED
BUN: 19 mg/dL (ref 6–20)
Calcium, Ion: 1.19 mmol/L (ref 1.15–1.40)
Chloride: 101 mmol/L (ref 101–111)
Creatinine, Ser: 0.8 mg/dL (ref 0.44–1.00)
Glucose, Bld: 99 mg/dL (ref 65–99)
HCT: 43 % (ref 36.0–46.0)
Hemoglobin: 14.6 g/dL (ref 12.0–15.0)
Potassium: 3.5 mmol/L (ref 3.5–5.1)
Sodium: 141 mmol/L (ref 135–145)
TCO2: 27 mmol/L (ref 22–32)

## 2017-09-10 LAB — APTT: aPTT: 30 seconds (ref 24–36)

## 2017-09-10 LAB — PROTIME-INR
INR: 0.98
Prothrombin Time: 12.9 seconds (ref 11.4–15.2)

## 2017-09-10 LAB — DIFFERENTIAL
Basophils Absolute: 0 10*3/uL (ref 0.0–0.1)
Basophils Relative: 1 %
Eosinophils Absolute: 0.3 10*3/uL (ref 0.0–0.7)
Eosinophils Relative: 4 %
Lymphocytes Relative: 36 %
Lymphs Abs: 2.9 10*3/uL (ref 0.7–4.0)
Monocytes Absolute: 0.5 10*3/uL (ref 0.1–1.0)
Monocytes Relative: 6 %
Neutro Abs: 4.3 10*3/uL (ref 1.7–7.7)
Neutrophils Relative %: 53 %

## 2017-09-10 LAB — RAPID URINE DRUG SCREEN, HOSP PERFORMED
Amphetamines: NOT DETECTED
Barbiturates: NOT DETECTED
Benzodiazepines: NOT DETECTED
Cocaine: NOT DETECTED
Opiates: NOT DETECTED
Tetrahydrocannabinol: NOT DETECTED

## 2017-09-10 LAB — CBC
HCT: 44.3 % (ref 36.0–46.0)
HCT: 44.1 % (ref 36.0–46.0)
Hemoglobin: 14.7 g/dL (ref 12.0–15.0)
Hemoglobin: 15 g/dL (ref 12.0–15.0)
MCH: 31.7 pg (ref 26.0–34.0)
MCH: 32 pg (ref 26.0–34.0)
MCHC: 33.2 g/dL (ref 30.0–36.0)
MCHC: 34 g/dL (ref 30.0–36.0)
MCV: 95.5 fL (ref 78.0–100.0)
MCV: 94 fL (ref 78.0–100.0)
PLATELETS: 217 10*3/uL (ref 150–400)
Platelets: 215 10*3/uL (ref 150–400)
RBC: 4.64 MIL/uL (ref 3.87–5.11)
RBC: 4.69 MIL/uL (ref 3.87–5.11)
RDW: 13.4 % (ref 11.5–15.5)
RDW: 13 % (ref 11.5–15.5)
WBC: 8 10*3/uL (ref 4.0–10.5)
WBC: 8.1 10*3/uL (ref 4.0–10.5)

## 2017-09-10 LAB — COMPREHENSIVE METABOLIC PANEL
ALT: 12 U/L — ABNORMAL LOW (ref 14–54)
AST: 18 U/L (ref 15–41)
Albumin: 3.7 g/dL (ref 3.5–5.0)
Alkaline Phosphatase: 68 U/L (ref 38–126)
Anion gap: 13 (ref 5–15)
BUN: 17 mg/dL (ref 6–20)
CO2: 24 mmol/L (ref 22–32)
Calcium: 9.7 mg/dL (ref 8.9–10.3)
Chloride: 103 mmol/L (ref 101–111)
Creatinine, Ser: 0.85 mg/dL (ref 0.44–1.00)
GFR calc Af Amer: >60 mL/min (ref >60)
GFR calc non Af Amer: 56 mL/min — ABNORMAL LOW (ref >60)
Glucose, Bld: 101 mg/dL — ABNORMAL HIGH (ref 65–99)
Potassium: 3.6 mmol/L (ref 3.5–5.1)
Sodium: 140 mmol/L (ref 135–145)
Total Bilirubin: 0.8 mg/dL (ref 0.3–1.2)
Total Protein: 7 g/dL (ref 6.5–8.1)

## 2017-09-10 LAB — I-STAT CG4 LACTIC ACID, ED: Lactic Acid, Venous: 1.14 mmol/L (ref 0.5–1.9)

## 2017-09-10 LAB — CREATININE, SERUM
CREATININE: 0.83 mg/dL (ref 0.44–1.00)
GFR, EST NON AFRICAN AMERICAN: 57 mL/min — AB (ref >60)

## 2017-09-10 LAB — ETHANOL: Alcohol, Ethyl (B): <10 mg/dL (ref <10)

## 2017-09-10 LAB — I-STAT TROPONIN, ED: Troponin i, poc: 0 ng/mL (ref 0.00–0.08)

## 2017-09-10 MED ORDER — STROKE: EARLY STAGES OF RECOVERY BOOK
Freq: Once | Status: AC
  Administered 2017-09-10: 22:00:00

## 2017-09-10 MED ORDER — SODIUM CHLORIDE 0.9% FLUSH
3.0000 mL | Freq: Two times a day (BID) | INTRAVENOUS | Status: DC
  Administered 2017-09-10 – 2017-09-11 (×2): 3 mL via INTRAVENOUS

## 2017-09-10 MED ORDER — DEXTROSE-NACL 5-0.45 % IV SOLN
INTRAVENOUS | Status: DC
  Administered 2017-09-10: 1000 mL via INTRAVENOUS

## 2017-09-10 MED ORDER — ENOXAPARIN SODIUM 40 MG/0.4ML ~~LOC~~ SOLN
40.0000 mg | Freq: Every day | SUBCUTANEOUS | Status: DC
  Administered 2017-09-11: 40 mg via SUBCUTANEOUS
  Filled 2017-09-10: qty 0.4

## 2017-09-10 MED ORDER — ACETAMINOPHEN 325 MG PO TABS: 325.0000 mg | ORAL_TABLET | Freq: Four times a day (QID) | ORAL | Status: DC | PRN

## 2017-09-10 MED ORDER — ONDANSETRON HCL 4 MG PO TABS: 4.0000 mg | ORAL_TABLET | Freq: Four times a day (QID) | ORAL | Status: DC | PRN

## 2017-09-10 MED ORDER — ASPIRIN EC 325 MG PO TBEC
325.0000 mg | DELAYED_RELEASE_TABLET | Freq: Every day | ORAL | Status: DC
  Administered 2017-09-11: 325 mg via ORAL
  Filled 2017-09-10: qty 1

## 2017-09-10 MED ORDER — SODIUM CHLORIDE 0.9% FLUSH: 3.0000 mL | INTRAVENOUS | Status: DC | PRN

## 2017-09-10 MED ORDER — DORZOLAMIDE HCL-TIMOLOL MAL 2-0.5 % OP SOLN
1.0000 [drp] | Freq: Two times a day (BID) | OPHTHALMIC | Status: DC
  Administered 2017-09-10 – 2017-09-11 (×2): 1 [drp] via OPHTHALMIC
  Filled 2017-09-10: qty 10

## 2017-09-10 MED ORDER — SODIUM CHLORIDE 0.9 % IV SOLN: 250.0000 mL | INTRAVENOUS | Status: DC | PRN

## 2017-09-10 MED ORDER — LOSARTAN POTASSIUM 50 MG PO TABS
100.0000 mg | ORAL_TABLET | Freq: Every day | ORAL | Status: DC
  Administered 2017-09-11: 100 mg via ORAL
  Filled 2017-09-10: qty 2

## 2017-09-10 MED ORDER — METOPROLOL SUCCINATE ER 25 MG PO TB24
25.0000 mg | ORAL_TABLET | Freq: Two times a day (BID) | ORAL | Status: DC
  Administered 2017-09-11: 25 mg via ORAL
  Filled 2017-09-10: qty 1

## 2017-09-10 MED ORDER — ENOXAPARIN SODIUM 40 MG/0.4ML ~~LOC~~ SOLN: 40.0000 mg | SUBCUTANEOUS | Status: DC

## 2017-09-10 MED ORDER — LORATADINE 10 MG PO TABS: 10.0000 mg | ORAL_TABLET | Freq: Every day | ORAL | Status: DC | PRN

## 2017-09-10 MED ORDER — ONDANSETRON HCL 4 MG/2ML IJ SOLN: 4.0000 mg | Freq: Four times a day (QID) | INTRAMUSCULAR | Status: DC | PRN

## 2017-09-10 MED ORDER — LEVOTHYROXINE SODIUM 112 MCG PO TABS
112.0000 ug | ORAL_TABLET | Freq: Every day | ORAL | Status: DC
  Filled 2017-09-10: qty 1

## 2017-09-10 MED ORDER — AMLODIPINE BESYLATE 5 MG PO TABS
5.0000 mg | ORAL_TABLET | Freq: Every day | ORAL | Status: DC
  Administered 2017-09-11: 5 mg via ORAL
  Filled 2017-09-10: qty 1

## 2017-09-10 NOTE — ED Notes (Signed)
Pt to MRI

## 2017-09-10 NOTE — ED Notes (Signed)
Attempted to call report; nurse unavailable - will call me back.

## 2017-09-10 NOTE — ED Provider Notes (Signed)
[place] EMERGENCY DEPARTMENT Provider Note   CSN: [id number] Arrival date & time: 09/10/17  1253     History   Chief Complaint Chief Complaint  Patient presents with  . Aphasia    HPI Brandy Huff is a 84 y.o. female.  HPI Patient presents to the emergency department with slurred speech weakness and increased lethargy.  Family states that this all started Saturday.  They stated that she is somewhat confused normally but does not have the slurred speech and garbled words.  They state that nothing seems to make the condition better or worse.  She was unable to do physical therapy yesterday due to her weakness.  The patient denies chest pain, shortness of breath, headache,blurred vision, neck pain, fever, cough numbness, dizziness, anorexia, edema, abdominal pain, nausea, vomiting, diarrhea, rash, back pain, dysuria, hematemesis, bloody stool, near syncope, or syncope. Past Medical History:  Diagnosis Date  . Arthritis   . Atrial fibrillation ([place])   . Cancer ([place])    Breast  . Cancer ([place])    Colon  . Cancer ([facility])    Skin  . Carotid artery occlusion   . Dermatophytosis of nail   . Fall from slipping Nov. 2013  . Hypertension   . PONV (postoperative nausea and vomiting)   . Thyroid disease     Patient Active Problem List   Diagnosis Date Noted  . Falls 12/22/2016  . Acute encephalopathy 12/21/2016  . Malignant hypertension 12/21/2016  . Hypokalemia 12/21/2016  . Pleural effusion associated with pulmonary infection 12/21/2016  . History of total colectomy - after colon Ca x 2 10/30/2016  . Community acquired pneumonia 10/30/2016  . Essential hypertension 10/30/2016  . Hypothyroidism 10/30/2016  . Volume depletion 10/30/2016  . Generalized weakness 10/30/2016  . Dermatophytosis of nail 05/11/2013  . Aftercare following surgery of the circulatory system, [place] 09/29/2012  . Occlusion and stenosis of carotid artery without mention of cerebral  infarction 03/17/2012    Past Surgical History:  Procedure Laterality Date  . ABDOMINAL HYSTERECTOMY    . APPENDECTOMY    . CAROTID ENDARTERECTOMY  2012   right  . CATARACT EXTRACTION     bilateral  . COLON SURGERY     Colectomy X 2  . ENDARTERECTOMY  02/08/2012   Procedure: ENDARTERECTOMY CAROTID;  Surgeon: Serafina Mitchell, MD;  Location: [facility] OR;  Service: Vascular;  Laterality: Left;  and Patch Angioplasty  . HERNIA REPAIR     Left Ventral hernia  . JOINT REPLACEMENT  2005   Bilateral knee replacements  . MASTECTOMY     Right Breast    OB History    No data available       Home Medications    Prior to Admission medications   Medication Sig Start Date End Date Taking? Authorizing Provider  acetaminophen (TYLENOL) 325 MG tablet Take 325 mg by mouth every 6 (six) hours as needed (for pain or headaches).   Yes [provider]  amLODipine (NORVASC) 5 MG tablet Take 1 tablet (5 mg total) by mouth daily. 12/25/16  Yes Hongalgi, Lenis Dickinson, MD  aspirin EC 325 MG tablet Take 325 mg by mouth daily.   Yes [provider]  dorzolamide-timolol (COSOPT) 22.3-6.8 MG/ML ophthalmic solution Place 1 drop into the right eye 2 (two) times daily.   Yes [provider]  hydrocortisone 2.5 % cream Apply 1 application topically every other day. UNDER THE LEFT BREAST 07/10/17  Yes [provider]  levothyroxine (  SYNTHROID, LEVOTHROID) 112 MCG tablet Take 112 mcg by mouth daily before breakfast.   Yes [provider]  loratadine (CLARITIN) 10 MG tablet Take 10 mg by mouth daily as needed for allergies.   Yes [provider]  losartan (COZAAR) 100 MG tablet Take 100 mg by mouth daily.   Yes [provider]  Menthol-Zinc Oxide (CALMOSEPTINE EX) Apply 1 application topically See admin instructions. 1 application to affected areas of body one to two times daily   Yes [provider]  metoprolol succinate (TOPROL-XL) 25 MG 24 hr tablet  Take 2 tablets (50 mg total) by mouth daily. Patient taking differently: Take 25 mg by mouth 2 (two) times daily.  12/24/16  Yes Hongalgi, Lenis Dickinson, MD  nystatin cream (MYCOSTATIN) Apply 1 application topically every other day. UNDER THE LEFT BREAST 07/17/17  Yes [provider]  Polyethyl Glyc-Propyl Glyc PF (SYSTANE ULTRA PF) 0.4-0.3 % SOLN Place 1 drop into the left eye 2 (two) times daily.   Yes [provider]    Family History Family History  Problem Relation Age of Onset  . Heart murmur Sister   . Heart disease Mother   . Cancer Daughter   . Hyperlipidemia Daughter   . Cancer Son   . Diabetes Son   . Hyperlipidemia Son   . Hypertension Son   . Heart attack Son   . Deep vein thrombosis Son     Social History Social History   Tobacco Use  . Smoking status: Never Smoker  . Smokeless tobacco: Current User    Types: Snuff  Substance Use Topics  . Alcohol use: No  . Drug use: No     Allergies   Quinolones; Adhesive [tape]; and Levaquin [levofloxacin in d5w]   Review of Systems Review of Systems Level 5 caveat applies due to altered mental status/dementia  Physical Exam Updated Vital Signs BP (!) 145/90   Pulse 100   Temp 98.1 F (36.7 C) (Rectal)   Resp 20   SpO2 97%   Physical Exam  Constitutional: She appears well-developed and well-nourished. No distress.  HENT:  Head: Normocephalic and atraumatic.  Mouth/Throat: Oropharynx is clear and moist.  Eyes: Pupils are equal, round, and reactive to light.  Neck: Normal range of motion. Neck supple.  Cardiovascular: Normal rate, regular rhythm and normal heart sounds. Exam reveals no gallop and no friction rub.  No murmur heard. Pulmonary/Chest: Effort normal and breath sounds normal. No respiratory distress. She has no wheezes.  Abdominal: Soft. Bowel sounds are normal. She exhibits no distension. There is no tenderness.  Neurological: She is alert. She exhibits normal muscle tone. Coordination  normal.  Patient has no ability to tell me the month day year but can tell me she is at [facility]  Skin: Skin is warm and dry. Capillary refill takes less than 2 seconds. No rash noted. No erythema.  Psychiatric: She has a normal mood and affect. Her behavior is normal.  Nursing note and vitals reviewed.    ED Treatments / Results  Labs (all labs ordered are listed, but only abnormal results are displayed) Labs Reviewed  COMPREHENSIVE METABOLIC PANEL - Abnormal; Notable for the following components:      Result Value   Glucose, Bld 101 (*)    ALT 12 (*)    GFR calc non Af Amer 56 (*)    All other components within normal limits  URINALYSIS, ROUTINE W REFLEX MICROSCOPIC - Abnormal; Notable for the following  components:   Hgb urine dipstick SMALL (*)    Nitrite POSITIVE (*)    Leukocytes, UA SMALL (*)    Bacteria, UA MANY (*)    Squamous Epithelial / LPF 0-5 (*)    All other components within normal limits  ETHANOL  PROTIME-INR  APTT  CBC  DIFFERENTIAL  RAPID URINE DRUG SCREEN, HOSP PERFORMED  I-STAT CHEM 8, ED  I-STAT TROPONIN, ED  I-STAT CG4 LACTIC ACID, ED  I-STAT CG4 LACTIC ACID, ED    EKG  EKG Interpretation None       Radiology Dg Chest 2 View  Result Date: 09/10/2017 CLINICAL DATA:  Altered mental status and hypertension. EXAM: CHEST  2 VIEW COMPARISON:  12/21/2016 FINDINGS: Cardiomegaly with aortic atherosclerosis. Resolved left pleural effusion. No acute pneumonic consolidation or CHF. Surgical clips project over the right axilla. Mild degenerate change along the dorsal spine. IMPRESSION: Stable cardiomegaly with aortic atherosclerosis. No active pulmonary disease. Electronically Signed   By: Ashley Royalty M.D.   On: 09/10/2017 15:30   Ct Head Wo Contrast  Result Date: 09/10/2017 CLINICAL DATA:  Slurred speech for several days EXAM: CT HEAD WITHOUT CONTRAST TECHNIQUE: Contiguous axial images were obtained from the base of the skull through the vertex  without intravenous contrast. COMPARISON:  12/21/2016 FINDINGS: Brain: Diffuse atrophic changes and scattered white matter ischemic changes are again seen and stable. No findings to suggest acute hemorrhage, acute infarction or space-occupying mass lesion are noted. Vascular: No hyperdense vessel or unexpected calcification. Skull: Normal. Negative for fracture or focal lesion. Sinuses/Orbits: Chronic opacification of the left maxillary antrum and some of the ethmoid air cells is noted. Other: None. IMPRESSION: Chronic atrophic and ischemic changes without acute abnormality. Electronically Signed   By: Inez Catalina M.D.   On: 09/10/2017 15:08    Procedures Procedures (including critical care time)  Medications Ordered in ED Medications - No data to display   Initial Impression / Assessment and Plan / ED Course  I have reviewed the triage vital signs and the nursing notes.  Pertinent labs & imaging results that were available during my care of the patient were reviewed by me and considered in my medical decision making (see chart for details).     I spoke with the [facility] about admission for further evaluation of the patient's lethargy weakness and slurred speech along with garbled words.  Patient has been stable otherwise here in the emergency department.  The hospitalist states they will come and speak with the patient.  Final Clinical Impressions(s) / ED Diagnoses   Final diagnoses:  None    ED Discharge Orders    None       Rebeca Allegra 09/10/17 1639    Jola Schmidt, MD 09/10/17 2234

## 2017-09-10 NOTE — ED Triage Notes (Signed)
Pt arrives with daughter from home for an abnormal speech that was noticed on Saturday. Pt is alert and oriented with no other neuro deficits. No weakness. Daughter states she has just becoming more confused over the last 2 days.

## 2017-09-10 NOTE — Consult Note (Addendum)
History and physical    Brandy Huff VPX:[id number] DOB: Oct 11, 1917 DOA: 09/10/2017  PCP: [name], MD   Patient coming from: Home  Chief Complaint: Confusion and dysarthria  HPI: Brandy Huff is a 84 y.o. female with medical history significant of Blindness and mild dementia, who was brought by her family due to dysarthria. Patient was at her usual state of health until 3 days ago when she was noted to be more somnolent, it was moderate in intensity, associated with dysarthria, no focality, no improving or worsening factors. Most of the history obtained from her family at bedside. Due to patient's persistent somnolence and dysarthria she was brought into the hospital for further evaluation   ED Course: Patient had extensive workup and has been negative for infection or cerebrovascular event.   Review of Systems:  1. General: No fevers, no chills, no weight gain or weight loss 2. ENT: No runny nose or sore throat, no hearing disturbances 3. Pulmonary: No dyspnea, cough, wheezing, or hemoptysis 4. Cardiovascular: No angina, claudication, lower extremity edema, pnd or orthopnea 5. Gastrointestinal: No nausea or vomiting, no diarrhea or constipation 6. Hematology: No easy bruisability or frequent infections 7. Urology: No dysuria, hematuria or increased urinary frequency 8. Dermatology: No rashes. 9. Neurology: No seizures or paresthesias. Noted to be more somnolent per patient's family, difficulty speaking.  10. Musculoskeletal: No joint pain or deformities  Past Medical History:  Diagnosis Date  . Arthritis   . Atrial fibrillation ([place])   . Cancer ([place])    Breast  . Cancer ([place])    Colon  . Cancer ([facility])    Skin  . Carotid artery occlusion   . Dermatophytosis of nail   . Fall from slipping Nov. 2013  . Hypertension   . PONV (postoperative nausea and vomiting)   . Thyroid disease     Past Surgical History:  Procedure Laterality Date  . ABDOMINAL HYSTERECTOMY     . APPENDECTOMY    . CAROTID ENDARTERECTOMY  2012   right  . CATARACT EXTRACTION     bilateral  . COLON SURGERY     Colectomy X 2  . ENDARTERECTOMY  02/08/2012   Procedure: ENDARTERECTOMY CAROTID;  Surgeon: Serafina Mitchell, MD;  Location: [facility] OR;  Service: Vascular;  Laterality: Left;  and Patch Angioplasty  . HERNIA REPAIR     Left Ventral hernia  . JOINT REPLACEMENT  2005   Bilateral knee replacements  . MASTECTOMY     Right Breast     reports that  has never smoked. Her smokeless tobacco use includes snuff. She reports that she does not drink alcohol or use drugs.  Allergies  Allergen Reactions  . Quinolones Other (See Comments)    AMS > noted with Levofloxacin and  (hallucinations and "took 6 weeks to get this out of her system"  . Adhesive [Tape] Other (See Comments)    Tape TEARS THE SKIN BECAUSE IT IS VERY THIN!!!!  . Levaquin [Levofloxacin In D5w] Other (See Comments)    Altered Mental Status, admitted to hospital with CNS toxicity    Family History  Problem Relation Age of Onset  . Heart murmur Sister   . Heart disease Mother   . Cancer Daughter   . Hyperlipidemia Daughter   . Cancer Son   . Diabetes Son   . Hyperlipidemia Son   . Hypertension Son   . Heart attack Son   . Deep vein thrombosis Son      Prior  to Admission medications   Medication Sig Start Date End Date Taking? Authorizing Provider  acetaminophen (TYLENOL) 325 MG tablet Take 325 mg by mouth every 6 (six) hours as needed (for pain or headaches).   Yes [provider]  amLODipine (NORVASC) 5 MG tablet Take 1 tablet (5 mg total) by mouth daily. 12/25/16  Yes Hongalgi, Lenis Dickinson, MD  aspirin EC 325 MG tablet Take 325 mg by mouth daily.   Yes [provider]  dorzolamide-timolol (COSOPT) 22.3-6.8 MG/ML ophthalmic solution Place 1 drop into the right eye 2 (two) times daily.   Yes [provider]  hydrocortisone 2.5 % cream Apply 1 application topically every other day. UNDER  THE LEFT BREAST 07/10/17  Yes [provider]  levothyroxine (SYNTHROID, LEVOTHROID) 112 MCG tablet Take 112 mcg by mouth daily before breakfast.   Yes [provider]  loratadine (CLARITIN) 10 MG tablet Take 10 mg by mouth daily as needed for allergies.   Yes [provider]  losartan (COZAAR) 100 MG tablet Take 100 mg by mouth daily.   Yes [provider]  Menthol-Zinc Oxide (CALMOSEPTINE EX) Apply 1 application topically See admin instructions. 1 application to affected areas of body one to two times daily   Yes [provider]  metoprolol succinate (TOPROL-XL) 25 MG 24 hr tablet Take 2 tablets (50 mg total) by mouth daily. Patient taking differently: Take 25 mg by mouth 2 (two) times daily.  12/24/16  Yes Hongalgi, Lenis Dickinson, MD  nystatin cream (MYCOSTATIN) Apply 1 application topically every other day. UNDER THE LEFT BREAST 07/17/17  Yes [provider]  Polyethyl Glyc-Propyl Glyc PF (SYSTANE ULTRA PF) 0.4-0.3 % SOLN Place 1 drop into the left eye 2 (two) times daily.   Yes [provider]    Physical Exam: Vitals:   09/10/17 1310 09/10/17 1426 09/10/17 1542  BP: (!) 172/85  (!) 145/90  Pulse: 91  100  Resp: 16  20  Temp: 98 F (36.7 C) 98.1 F (36.7 C)   TempSrc: Oral Rectal   SpO2: 100%  97%    Constitutional: NAD,  Vitals:   09/10/17 1310 09/10/17 1426 09/10/17 1542  BP: (!) 172/85  (!) 145/90  Pulse: 91  100  Resp: 16  20  Temp: 98 F (36.7 C) 98.1 F (36.7 C)   TempSrc: Oral Rectal   SpO2: 100%  97%   Eyes: PERRL, lids and conjunctivae normal Head normocephalic, nose and ears with no deformities ENMT: Mucous membranes are moist. Posterior pharynx clear of any exudate or lesions.Normal dentition.  Neck: normal, supple, no masses, no thyromegaly Respiratory: clear to auscultation bilaterally, no wheezing, no crackles. Normal respiratory effort. No accessory muscle use.  Cardiovascular: Regular rate and rhythm,  no murmurs / rubs / gallops. No extremity edema. 2+ pedal pulses. No carotid bruits.  Abdomen: no tenderness, no masses palpated. No hepatosplenomegaly. Bowel sounds positive.  Musculoskeletal: no clubbing / cyanosis. No joint deformity upper and lower extremities. Good ROM, no contractures. Normal muscle tone. Hypertrophic knees.  Skin: no rashes, lesions, ulcers. No induration Neurologic: CN 2-12 grossly intact. Sensation intact, DTR normal. Strength 5/5 in all 4. Her speech is slow but coherent, no significant slurred or facial dropping.     Labs on Admission: I have personally reviewed following labs and imaging studies  CBC: Recent Labs  Lab 09/10/17 1332 09/10/17 1359  WBC 8.0  --   NEUTROABS 4.3  --   HGB 14.7 14.6  HCT 44.3  43.0  MCV 95.5  --   PLT 215  --    Basic Metabolic Panel: Recent Labs  Lab 09/10/17 1332 09/10/17 1359  NA 140 141  K 3.6 3.5  CL 103 101  CO2 24  --   GLUCOSE 101* 99  BUN 17 19  CREATININE 0.85 0.80  CALCIUM 9.7  --    GFR: CrCl cannot be calculated (Unknown ideal weight.). Liver Function Tests: Recent Labs  Lab 09/10/17 1332  AST 18  ALT 12*  ALKPHOS 68  BILITOT 0.8  PROT 7.0  ALBUMIN 3.7   No results for input(s): LIPASE, AMYLASE in the last 168 hours. No results for input(s): AMMONIA in the last 168 hours. Coagulation Profile: Recent Labs  Lab 09/10/17 1332  INR 0.98   Cardiac Enzymes: No results for input(s): CKTOTAL, CKMB, CKMBINDEX, TROPONINI in the last 168 hours. BNP (last 3 results) No results for input(s): PROBNP in the last 8760 hours. HbA1C: No results for input(s): HGBA1C in the last 72 hours. CBG: No results for input(s): GLUCAP in the last 168 hours. Lipid Profile: No results for input(s): CHOL, HDL, LDLCALC, TRIG, CHOLHDL, LDLDIRECT in the last 72 hours. Thyroid Function Tests: No results for input(s): TSH, T4TOTAL, FREET4, T3FREE, THYROIDAB in the last 72 hours. Anemia Panel: No results for input(s):  VITAMINB12, FOLATE, FERRITIN, TIBC, IRON, RETICCTPCT in the last 72 hours. Urine analysis:    Component Value Date/Time   COLORURINE YELLOW 09/10/2017 1332   APPEARANCEUR CLEAR 09/10/2017 1332   LABSPEC 1.006 09/10/2017 1332   PHURINE 7.0 09/10/2017 1332   GLUCOSEU NEGATIVE 09/10/2017 1332   HGBUR SMALL (A) 09/10/2017 1332   BILIRUBINUR NEGATIVE 09/10/2017 1332   KETONESUR NEGATIVE 09/10/2017 1332   PROTEINUR NEGATIVE 09/10/2017 1332   UROBILINOGEN 0.2 09/09/2016 1811   NITRITE POSITIVE (A) 09/10/2017 1332   LEUKOCYTESUR SMALL (A) 09/10/2017 1332    Radiological Exams on Admission: Dg Chest 2 View  Result Date: 09/10/2017 CLINICAL DATA:  Altered mental status and hypertension. EXAM: CHEST  2 VIEW COMPARISON:  12/21/2016 FINDINGS: Cardiomegaly with aortic atherosclerosis. Resolved left pleural effusion. No acute pneumonic consolidation or CHF. Surgical clips project over the right axilla. Mild degenerate change along the dorsal spine. IMPRESSION: Stable cardiomegaly with aortic atherosclerosis. No active pulmonary disease. Electronically Signed   By: Ashley Royalty M.D.   On: 09/10/2017 15:30   Ct Head Wo Contrast  Result Date: 09/10/2017 CLINICAL DATA:  Slurred speech for several days EXAM: CT HEAD WITHOUT CONTRAST TECHNIQUE: Contiguous axial images were obtained from the base of the skull through the vertex without intravenous contrast. COMPARISON:  12/21/2016 FINDINGS: Brain: Diffuse atrophic changes and scattered white matter ischemic changes are again seen and stable. No findings to suggest acute hemorrhage, acute infarction or space-occupying mass lesion are noted. Vascular: No hyperdense vessel or unexpected calcification. Skull: Normal. Negative for fracture or focal lesion. Sinuses/Orbits: Chronic opacification of the left maxillary antrum and some of the ethmoid air cells is noted. Other: None. IMPRESSION: Chronic atrophic and ischemic changes without acute abnormality. Electronically  Signed   By: Inez Catalina M.D.   On: 09/10/2017 15:08    EKG: Independently reviewed. Sinus rhythm, 82 bpm, normal axis, normal intervals, positive artifact.   Assessment/Plan Active Problems:   * No active hospital problems. *  84 year old female who lives at home, she's been taking care of her children, does have blindness and mild to moderate dementia. Her primary caregiver was absent for last 5 days, when she returned  patient was noted to be somnolent along with dysarthria, symptoms have been persistent. On initial physical examination temperature 98.1, blood pressure 145/90, heart rate 100, respiratory 20, oxygen saturation 97% on room air.  Sodium 140, potassium 3.6, chloride 13, bicarbonate 24, glucose 11, BUN 17, creatinine 0.85, white count 8.0, hemoglobin 14.7, hematocrit 44.3, platelets 215, chest x-ray was negative for infiltrates. Head CT with chronic atrophic and ischemic changes without acute abnormality. Urinalysis with 60-30 white cells, positive nitrates, specific gravity 1.006.  Working diagnosis encephalopathy in the setting of advanced age and mild to moderate dementia.   1. Encephalopathy. Patient has rule out for any significant neurologic event, will recommend to follow-up with her primary care physician. For now no further testing advised. It is likely that her confusion may worsen in the hospital, with high risk of developing delirium. This has been discussed with her family at the bedside, and they are agreeing to take patient home with a close follow-up.   Old records reviewed, she had similar episode back in 2018, he does have chronic pyuria. Currently no signs of infection. Will hold on antibiotic therapy, apparently she developed and adverse neurologic reaction to fluoroquinolones.   2. Hypertension. Will continue amlodipine for blood pressure control along with losartan and metoprolol.   3. Hypothyroidism. Continue levothyroxine, apparently she has been  compliant  4. Dementia. Advised to keep patient well hydrated.   5. History of paroxysmal atrial fibrillation. Continue metoprolol for rate control, not on anticoagulation likely due to high fall risk.   I spoke with patient's family as well as the ED physician, and decision was made to discharge patient home as long as MRI is negative for any acute changes.   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 Tomi Bamberger and will admit patient to telemetry for further monitoring and workup.   Acute CVA: Will continue aspirin, blood pressure control, echocardiogram and bilateral ultrasound of the carotids. Neuro checks per protocol along with speech evaluation. Physical therapy evaluation. Will need an non urgent consult to Neurology in am.   Tawni Millers MD [facility] Pager [phone]  If 7PM-7AM, please contact night-coverage [email or web address] Password [facility]  09/10/2017, 5:41 PM

## 2017-09-10 NOTE — ED Provider Notes (Signed)
Patient placed in Quick Look pathway, seen and evaluated   Chief Complaint: Aphasia  HPI:   84 year old female presents today with her daughter who reports she has had difficulty speaking, garbled words.  They note that symptoms have been present for the last 3 days.  She notes trying to do physical therapy exercises with her yesterday and she was unable to this.  She notes she is alert.  ROS:  (one)  Physical Exam:   Gen: No distress  Neuro: Awake and Alert  Skin: Warm    Focused Exam: Slightly slurred speech, no acute distal neuro deficits    Initiation of care has begun. The patient has been counseled on the process, plan, and necessity for staying for the completion/evaluation, and the remainder of the medical screening examination    Francee Gentile 09/10/17 1333    Isla Pence, MD 09/10/17 [phone]

## 2017-09-11 ENCOUNTER — Inpatient Hospital Stay (HOSPITAL_COMMUNITY): Payer: Medicare Other

## 2017-09-11 ENCOUNTER — Encounter (HOSPITAL_COMMUNITY): Payer: Medicare Other

## 2017-09-11 DIAGNOSIS — I639 Cerebral infarction, unspecified: Secondary | ICD-10-CM | POA: Diagnosis not present

## 2017-09-11 DIAGNOSIS — I10 Essential (primary) hypertension: Secondary | ICD-10-CM

## 2017-09-11 DIAGNOSIS — R471 Dysarthria and anarthria: Secondary | ICD-10-CM | POA: Diagnosis not present

## 2017-09-11 DIAGNOSIS — I480 Paroxysmal atrial fibrillation: Secondary | ICD-10-CM | POA: Diagnosis not present

## 2017-09-11 LAB — COMPREHENSIVE METABOLIC PANEL
ALBUMIN: 3.2 g/dL — AB (ref 3.5–5.0)
ALT: 11 U/L — ABNORMAL LOW (ref 14–54)
ANION GAP: 11 (ref 5–15)
AST: 16 U/L (ref 15–41)
Alkaline Phosphatase: 60 U/L (ref 38–126)
BUN: 14 mg/dL (ref 6–20)
CHLORIDE: 103 mmol/L (ref 101–111)
CO2: 26 mmol/L (ref 22–32)
Calcium: 9.1 mg/dL (ref 8.9–10.3)
Creatinine, Ser: 0.86 mg/dL (ref 0.44–1.00)
GFR calc Af Amer: >60 mL/min (ref >60)
GFR calc non Af Amer: 55 mL/min — ABNORMAL LOW (ref >60)
GLUCOSE: 112 mg/dL — AB (ref 65–99)
POTASSIUM: 3.1 mmol/L — AB (ref 3.5–5.1)
SODIUM: 140 mmol/L (ref 135–145)
Total Bilirubin: 1.1 mg/dL (ref 0.3–1.2)
Total Protein: 6.1 g/dL — ABNORMAL LOW (ref 6.5–8.1)

## 2017-09-11 LAB — CBC
HEMATOCRIT: 40.6 % (ref 36.0–46.0)
HEMOGLOBIN: 13.6 g/dL (ref 12.0–15.0)
MCH: 31.4 pg (ref 26.0–34.0)
MCHC: 33.5 g/dL (ref 30.0–36.0)
MCV: 93.8 fL (ref 78.0–100.0)
Platelets: 209 10*3/uL (ref 150–400)
RBC: 4.33 MIL/uL (ref 3.87–5.11)
RDW: 13.2 % (ref 11.5–15.5)
WBC: 7.1 10*3/uL (ref 4.0–10.5)

## 2017-09-11 LAB — ECHOCARDIOGRAM COMPLETE
Height: 63 in
Weight: 2246.93 oz

## 2017-09-11 LAB — HEMOGLOBIN A1C
Hgb A1c MFr Bld: 5.2 % (ref 4.8–5.6)
MEAN PLASMA GLUCOSE: 102.54 mg/dL

## 2017-09-11 LAB — LIPID PANEL
CHOL/HDL RATIO: 5.5 ratio
CHOLESTEROL: 204 mg/dL — AB (ref 0–200)
HDL: 37 mg/dL — ABNORMAL LOW (ref >40)
LDL Cholesterol: 129 mg/dL — ABNORMAL HIGH (ref 0–99)
TRIGLYCERIDES: 190 mg/dL — AB (ref <150)
VLDL: 38 mg/dL (ref 0–40)

## 2017-09-11 MED ORDER — METOPROLOL SUCCINATE ER 25 MG PO TB24: 25.0000 mg | ORAL_TABLET | Freq: Two times a day (BID) | ORAL | Status: DC

## 2017-09-11 NOTE — Consult Note (Signed)
STROKE CONSULT  Requesting Physician: Dr. Elon Jester    Chief Complaint: stroke  History obtained from:  Patient   Chart   HPI:                                                                                                                                         Brandy Huff is an 84 y.o. female with history of atrial fibrillation, carotid artery occlusion, right carotid endarterectomies, peripheral vascular disease, and hypertension.  Per family members of the last 3 days patient has had a sinus infection however they noticed that she had some dysarthria.  The daughter was nervous that the dysarthria did not seem to fit with her sinus infection and thus brought patient to the hospital to be evaluated for possible TIA.  Patient's blood pressure, per daughter, often runs high and they are working with cardiology on this.  Apparently she is on 3 medications but still cannot get her blood pressure down below 140 and often is systolically in the 580D and 160s.  While in the hospital she did have a period of systolic 983.  Currently she is systolically 382.  Patient is blind in her right eye and has macular degeneration in her left eye.  She is clinically blind.  Currently she still has dysarthria but no other localizing or lateralizing symptoms.  Discussion with family was had about further testing.  They adamantly did not want to do a carotid Doppler as the patient does not want to go under surgery.  The patient also refuses to have a MRA.  Currently she is on aspirin daily.   Date last known well: Date: 09/08/2017 Time last known well: Unable to determine tPA Given: No: out of window NIH stroke scale of 5-3 for visual , 1 for facial palsy, 1 for dysarthria  Modified Rankin: Rankin Score=1  Past Medical History:  Diagnosis Date  . Arthritis   . Atrial fibrillation ([place])   . Cancer ([place])    Breast  . Cancer ([place])    Colon  . Cancer ([facility])    Skin  . Carotid artery occlusion   .  Dermatophytosis of nail   . Fall from slipping Nov. 2013  . Hypertension   . PONV (postoperative nausea and vomiting)   . Thyroid disease     Past Surgical History:  Procedure Laterality Date  . ABDOMINAL HYSTERECTOMY    . APPENDECTOMY    . CAROTID ENDARTERECTOMY  2012   right  . CATARACT EXTRACTION     bilateral  . COLON SURGERY     Colectomy X 2  . ENDARTERECTOMY  02/08/2012   Procedure: ENDARTERECTOMY CAROTID;  Surgeon: Serafina Mitchell, MD;  Location: [facility] OR;  Service: Vascular;  Laterality: Left;  and Patch Angioplasty  . HERNIA REPAIR     Left Ventral hernia  . JOINT REPLACEMENT  2005   Bilateral knee  replacements  . MASTECTOMY     Right Breast    Family History  Problem Relation Age of Onset  . Heart murmur Sister   . Heart disease Mother   . Cancer Daughter   . Hyperlipidemia Daughter   . Cancer Son   . Diabetes Son   . Hyperlipidemia Son   . Hypertension Son   . Heart attack Son   . Deep vein thrombosis Son    Social History:  reports that  has never smoked. Her smokeless tobacco use includes snuff. She reports that she does not drink alcohol or use drugs.  Allergies:  Allergies  Allergen Reactions  . Quinolones Other (See Comments)    AMS > noted with Levofloxacin and  (hallucinations and "took 6 weeks to get this out of her system"  . Adhesive [Tape] Other (See Comments)    Tape TEARS THE SKIN BECAUSE IT IS VERY THIN!!!!  . Levaquin [Levofloxacin In D5w] Other (See Comments)    Altered Mental Status, admitted to hospital with CNS toxicity    Medications:                                                                                                                           Prior to Admission:  Medications Prior to Admission  Medication Sig Dispense Refill Last Dose  . acetaminophen (TYLENOL) 325 MG tablet Take 325 mg by mouth every 6 (six) hours as needed (for pain or headaches).   Unk at [employer]  . amLODipine (NORVASC) 5 MG tablet Take 1 tablet (5 mg  total) by mouth daily. 30 tablet 0 09/10/2017 at am  . aspirin EC 325 MG tablet Take 325 mg by mouth daily.   09/10/2017 at 1200  . dorzolamide-timolol (COSOPT) 22.3-6.8 MG/ML ophthalmic solution Place 1 drop into the right eye 2 (two) times daily.   09/10/2017 at am  . hydrocortisone 2.5 % cream Apply 1 application topically every other day. UNDER THE LEFT BREAST   09/09/2017 at Unknown time  . levothyroxine (SYNTHROID, LEVOTHROID) 112 MCG tablet Take 112 mcg by mouth daily before breakfast.   09/10/2017 at am  . loratadine (CLARITIN) 10 MG tablet Take 10 mg by mouth daily as needed for allergies.   Past Week at Unknown time  . losartan (COZAAR) 100 MG tablet Take 100 mg by mouth daily.   09/10/2017 at am  . Menthol-Zinc Oxide (CALMOSEPTINE EX) Apply 1 application topically See admin instructions. 1 application to affected areas of body one to two times daily   09/10/2017 at Unknown time  . metoprolol succinate (TOPROL-XL) 25 MG 24 hr tablet Take 2 tablets (50 mg total) by mouth daily. (Patient taking differently: Take 25 mg by mouth 2 (two) times daily. ) 60 tablet 0 09/10/2017 at 1200  . nystatin cream (MYCOSTATIN) Apply 1 application topically every other day. UNDER THE LEFT BREAST   09/09/2017 at Unknown time  . Polyethyl Glyc-Propyl Glyc PF (SYSTANE ULTRA PF) 0.4-0.3 %  SOLN Place 1 drop into the left eye 2 (two) times daily.   09/10/2017 at am   Scheduled: . amLODipine  5 mg Oral Daily  . aspirin EC  325 mg Oral Daily  . dorzolamide-timolol  1 drop Right Eye BID  . enoxaparin (LOVENOX) injection  40 mg Subcutaneous Daily  . levothyroxine  112 mcg Oral QAC breakfast  . losartan  100 mg Oral Daily  . metoprolol succinate  25 mg Oral BID  . sodium chloride flush  3 mL Intravenous Q12H   Continuous: . sodium chloride    . dextrose 5 % and 0.45% NaCl 1,000 mL (09/10/17 2210)   MWU:XLKGMW chloride, acetaminophen, loratadine, ondansetron **OR** ondansetron (ZOFRAN) IV, sodium chloride flush  ROS:                                                                                                                                        History obtained from Daughter  General ROS: negative for - chills, fatigue, fever, night sweats, weight gain or weight loss Psychological ROS: negative for - , hallucinations, memory difficulties, mood swings or  Ophthalmic ROS: negative for - blurry vision, double vision, eye pain or loss of vision ENT ROS: negative for - epistaxis, nasal discharge, oral lesions, sore throat, tinnitus or vertigo Respiratory ROS: negative for - cough,  shortness of breath or wheezing Cardiovascular ROS: negative for - chest pain, dyspnea on exertion,  Gastrointestinal ROS: negative for - abdominal pain, diarrhea,  nausea/vomiting or stool incontinence Genito-Urinary ROS: negative for - dysuria, hematuria, incontinence or urinary frequency/urgency Musculoskeletal ROS: negative for - joint swelling or muscular weakness Neurological ROS: as noted in HPI   General Examination:                                                                                                      Blood pressure (!) 156/76, pulse 86, temperature 97.6 F (36.4 C), temperature source Oral, resp. rate 18, height 5\' 3"  (1.6 m), weight 63.7 kg (140 lb 6.9 oz), SpO2 95 %.  HEENT-  Normocephalic, no lesions, without obvious abnormality.  Normal external eye and conjunctiva. --Right eye is 6 mm and dilated secondary to previous stroke blind in that eye Cardiovascular- S1-S2 audible, pulses palpable throughout   Lungs-no rhonchi or wheezing noted, no excessive working breathing.  Saturations within normal limits Abdomen- All 4 quadrants palpated and nontender Extremities- Warm, dry and intact Musculoskeletal-no joint tenderness, deformity or swelling Skin-warm and dry, no hyperpigmentation,  vitiligo, or suspicious lesions  Neurological Examination Mental Status: Alert, oriented.  Speech dysarthric without  evidence of aphasia.  Able to follow simple commands without difficulty (with the caveat that she is exceptionally hard of hearing). Cranial Nerves: II: Patient is clinically blind cannot see out of right eye has only minimal vision in left eye III,IV, VI: ptosis  present bilaterally, extra-ocular motions intact bilaterally, right eye pupil is 6 mm and nonreactive secondary to previous stroke, left eye pupil is 2 mm and reactive  V,VII: smile symmetric patient has a right nasolabial fold decrease, facial light touch sensation normal bilaterally VIII: Extremely hard of hearing. IX,X: uvula rises symmetrically XI: bilateral shoulder shrug XII: midline tongue extension Motor: Right : Upper extremity   5/5    Left:     Upper extremity   5/5  Lower extremity   5/5     Lower extremity   5/5 Tone and bulk:normal tone throughout; no atrophy noted Sensory: Pinprick and light touch intact throughout, bilaterally--bilateral lower extremity peripheral vascular disease causing neuropathy from feet to midcalf Deep Tendon Reflexes: Decreased throughout Plantars: Right: downgoing   Left: downgoing Cerebellar: Although she cannot see my finger when patient touches her nose and reaches straight out she has no dysmetria.  She does have a postural tremor.  Heel to shin did not show any dysmetria Gait: Not tested   Lab Results: Basic Metabolic Panel: Recent Labs  Lab 09/10/17 1332 09/10/17 1359 09/10/17 2124  NA 140 141  --   K 3.6 3.5  --   CL 103 101  --   CO2 24  --   --   GLUCOSE 101* 99  --   BUN 17 19  --   CREATININE 0.85 0.80 0.83  CALCIUM 9.7  --   --    CBC: Recent Labs  Lab 09/10/17 1332 09/10/17 1359 09/10/17 2124 09/11/17 0845  WBC 8.0  --  8.1 7.1  NEUTROABS 4.3  --   --   --   HGB 14.7 14.6 15.0 13.6  HCT 44.3 43.0 44.1 40.6  MCV 95.5  --  94.0 93.8  PLT 215  --  217 209    Imaging: Ct Head Wo Contrast Result Date: 09/10/2017 CLINICAL DATA:  Slurred speech for several  days EXAM: CT HEAD WITHOUT CONTRAST TECHNIQUE: Contiguous axial images were obtained from the base of the skull through the vertex without intravenous contrast. COMPARISON:  12/21/2016 FINDINGS: Brain: Diffuse atrophic changes and scattered white matter ischemic changes are again seen and stable. No findings to suggest acute hemorrhage, acute infarction or space-occupying mass lesion are noted. Vascular: No hyperdense vessel or unexpected calcification. Skull: Normal. Negative for fracture or focal lesion. Sinuses/Orbits: Chronic opacification of the left maxillary antrum and some of the ethmoid air cells is noted. Other: None. IMPRESSION: Chronic atrophic and ischemic changes without acute abnormality. Electronically Signed   By: Inez Catalina M.D.   On: 09/10/2017 15:08   Mr Brain Wo Contrast  IMPRESSION: 1. Moderately motion degraded examination. 2. Acute 6 x 14 mm LEFT basal ganglia nonhemorrhagic infarct. 3. Moderate to severe chronic small vessel ischemic disease. Electronically Signed   By: Elon Alas M.D.   On: 09/10/2017 18:10   Assessment and plan discussed with with attending physician and they are in agreement.    Etta Quill PA-C [facility] [phone]  09/11/2017, 9:47 AM   Attending Neurohospitalist Addendum Patient seen and examined with APP/Resident. Agree with the history and physical as documented  above.  I have independently reviewed the chart, obtained history, review of systems and examined the patient.I have personally reviewed pertinent head/neck/spine imaging (CT/MRI).  My independent review of the brain MRI - MRI brain shows a left basal ganglia infarct-etiology likely small vessel disease.  Noncontrast CT of the head does not show any bleed.   Assessment: 84 y.o. female presenting with 3 days of dysarthria, right nasolabial fold decrease and found to have a Acute 6 x 14 mm LEFT basal ganglia nonhemorrhagic infarct.   Patient and family are refusing  to have carotid Dopplers and/or MRA of brain. Stroke likely small vessel etiology but given history of atrial fibrillation not on anticoagulation, it is possible that this could be an embolic event. Family not keen on pursuing any further workup as the patient has refused to undergo for any surgical intervention should the testing reveal any abnormalities.  Our recommendations are below.  Stroke Risk Factors - atrial fibrillation, carotid stenosis and hypertension  Recommend 1.  LDL 129, A1c pending 2.  MRI has been done patient refuses MRA head and neck.  I tried to persuade them to at least to get carotid Dopplers as there are noninvasive and do not require patient to go into a machine.  Primary team to address this with the patient again. 3. PT consult, OT consult, Speech consult 4. Echocardiogram 5. 80 mg of Atorvistatin 6. Prophylactic therapy-Antiplatelet med: Consider Plavix 75 mg daily 7. Risk factor modification 8. Telemetry monitoring 9. Frequent neuro checks 10 NPO until passes stroke swallow screen 11 please page stroke NP  Or  PA  Or MD from 8am -4 pm  as this patient from this time will be  followed by the stroke.   You can look them up on [email or web address]  Password [email or web address]  -- Amie Portland, MD [facility] Pager: [phone] If 7pm to 7am, please call on call as listed on AMION.

## 2017-09-11 NOTE — Progress Notes (Addendum)
Physician Discharge Summary  Brandy Huff  HGD:[id number]  DOB: March 19, 1918  DOA: 09/10/2017 PCP: [name], MD  Admit date: 09/10/2017 Discharge date: 09/11/2017  Admitted From: Home  Disposition:  Home   Recommendations for Outpatient Follow-up:  1. Follow up with PCP in 1-2 weeks 2. Follow up with Neurology to establish care in 2-4 weeks   3. Please obtain BMP/CBC in one week 4. May consider Carotid doppler as outpatient   Home Health: PT/OT, Speech   Discharge Condition: Stable   CODE STATUS: DNR  Diet recommendation: Heart Healthy   Brief/Interim Summary: For full details see H&P/progress note but in brief, Brandy Huff is a 84 year old female with medical history significant for hypertension, hypothyroidism, dementia and blindness, presented to the emergency department due to confusion and dysarthria.  Upon ED evaluation she was found to have acute stroke on MRI.  Patient was admitted for further evaluation. Family want conservative measures. Neurology evaluated patient and recommended Plavix, but family decided to continue ASA only, given risk of fall. Patient is stable and was discharged home to follow up with PCP.   Subjective: Patient seen and examined, report feeling well. Still with some issues of dysarthria. No acute events overnight. Patient participated with PT with no issues.   Discharge Diagnoses/Hospital Course:  Metabolic encephalopathy Due to acute CVA  See below   Acute CVA - Dysarthria  MRI shows acute non hemorrhagic infarct on the left basal ganglia.  ECHO shows EF 65-70% and grade 1 diastolic dysfunction Family decided to not pursued carotid evaluation as findings will not change management, family not interested on surgical procedure if carotids are stenosed Patient on ASA 325 mg prior to admission, neurology recommended to consider Plavix, discussed with family risk and benefits, they have decided not to start Plavix and patient is high risk  for falls, given blindness.  Statin increased to 80 mg  Follow up as outpatient, recommended neurology follow up in 2-4 weeks   HTN    BP stable during hospital stay  Continue home medications with no changes   Hypothyroidism  Stable  Continue Synthroid   PAF  HR well controlled  Not on a/c due high fall risk   Dementia  Mentally at baseline   All other chronic medical condition were stable during the hospitalization.  Patient was seen by physical therapy, home health PT  On the day of the discharge the patient's vitals were stable, and no other acute medical condition were reported by patient. the patient was felt safe to be discharge to home   Discharge Instructions  You were cared for by a hospitalist during your hospital stay. If you have any questions about your discharge medications or the care you received while you were in the hospital after you are discharged, you can call the unit and asked to speak with the hospitalist on call if the hospitalist that took care of you is not available. Once you are discharged, your primary care physician will handle any further medical issues. Please note that NO REFILLS for any discharge medications will be authorized once you are discharged, as it is imperative that you return to your primary care physician (or establish a relationship with a primary care physician if you do not have one) for your aftercare needs so that they can reassess your need for medications and monitor your lab values.  Discharge Instructions    Call MD for:  difficulty breathing, headache or visual disturbances   Complete by:  As directed    Call MD for:  extreme fatigue   Complete by:  As directed    Call MD for:  hives   Complete by:  As directed    Call MD for:  persistant dizziness or light-headedness   Complete by:  As directed    Call MD for:  persistant nausea and vomiting   Complete by:  As directed    Call MD for:  redness, tenderness, or signs of  infection (pain, swelling, redness, odor or green/yellow discharge around incision site)   Complete by:  As directed    Call MD for:  severe uncontrolled pain   Complete by:  As directed    Call MD for:  temperature >100.4   Complete by:  As directed    Diet - low sodium heart healthy   Complete by:  As directed    Increase activity slowly   Complete by:  As directed      Allergies as of 09/11/2017      Reactions   Quinolones Other (See Comments)   AMS > noted with Levofloxacin and  (hallucinations and "took 6 weeks to get this out of her system"   Adhesive [tape] Other (See Comments)   Tape TEARS THE [place]!!!!   Levaquin [levofloxacin In D5w] Other (See Comments)   Altered Mental Status, admitted to hospital with CNS toxicity      Medication List    STOP taking these medications   CALMOSEPTINE EX     TAKE these medications   acetaminophen 325 MG tablet Commonly known as:  TYLENOL Take 325 mg by mouth every 6 (six) hours as needed (for pain or headaches).   amLODipine 5 MG tablet Commonly known as:  NORVASC Take 1 tablet (5 mg total) by mouth daily.   aspirin EC 325 MG tablet Take 325 mg by mouth daily.   dorzolamide-timolol 22.3-6.8 MG/ML ophthalmic solution Commonly known as:  COSOPT Place 1 drop into the right eye 2 (two) times daily.   hydrocortisone 2.5 % cream Apply 1 application topically every other day. UNDER THE LEFT BREAST   levothyroxine 112 MCG tablet Commonly known as:  SYNTHROID, LEVOTHROID Take 112 mcg by mouth daily before breakfast.   loratadine 10 MG tablet Commonly known as:  CLARITIN Take 10 mg by mouth daily as needed for allergies.   losartan 100 MG tablet Commonly known as:  COZAAR Take 100 mg by mouth daily.   metoprolol succinate 25 MG 24 hr tablet Commonly known as:  TOPROL-XL Take 1 tablet (25 mg total) by mouth 2 (two) times daily.   nystatin cream Commonly known as:  MYCOSTATIN Apply 1 application  topically every other day. UNDER THE LEFT BREAST   SYSTANE ULTRA PF 0.4-0.3 % Soln Generic drug:  Polyethyl Glyc-Propyl Glyc PF Place 1 drop into the left eye 2 (two) times daily.      Follow-up Information    [name], MD. Schedule an appointment as soon as possible for a visit in 1 week(s).   Specialty:  Internal Medicine Why:  Hospitall follow up  Contact information: [address] [place] [address] [phone]        Garvin Fila, MD. Schedule an appointment as soon as possible for a visit in 2 week(s).   Specialties:  Neurology, Radiology Why:  Establish care - new stroke  Contact information: [address] [place] [address] [phone]  Allergies  Allergen Reactions  . Quinolones Other (See Comments)    AMS > noted with Levofloxacin and  (hallucinations and "took 6 weeks to get this out of her system"  . Adhesive [Tape] Other (See Comments)    Tape TEARS THE SKIN BECAUSE IT IS VERY THIN!!!!  . Levaquin [Levofloxacin In D5w] Other (See Comments)    Altered Mental Status, admitted to hospital with CNS toxicity    Consultations:  Neurology   Procedures/Studies: Dg Chest 2 View  Result Date: 09/10/2017 CLINICAL DATA:  Altered mental status and hypertension. EXAM: CHEST  2 VIEW COMPARISON:  12/21/2016 FINDINGS: Cardiomegaly with aortic atherosclerosis. Resolved left pleural effusion. No acute pneumonic consolidation or CHF. Surgical clips project over the right axilla. Mild degenerate change along the dorsal spine. IMPRESSION: Stable cardiomegaly with aortic atherosclerosis. No active pulmonary disease. Electronically Signed   By: Ashley Royalty M.D.   On: 09/10/2017 15:30   Ct Head Wo Contrast  Result Date: 09/10/2017 CLINICAL DATA:  Slurred speech for several days EXAM: CT HEAD WITHOUT CONTRAST TECHNIQUE: Contiguous axial images were obtained from the base of the skull through the vertex without intravenous  contrast. COMPARISON:  12/21/2016 FINDINGS: Brain: Diffuse atrophic changes and scattered white matter ischemic changes are again seen and stable. No findings to suggest acute hemorrhage, acute infarction or space-occupying mass lesion are noted. Vascular: No hyperdense vessel or unexpected calcification. Skull: Normal. Negative for fracture or focal lesion. Sinuses/Orbits: Chronic opacification of the left maxillary antrum and some of the ethmoid air cells is noted. Other: None. IMPRESSION: Chronic atrophic and ischemic changes without acute abnormality. Electronically Signed   By: Inez Catalina M.D.   On: 09/10/2017 15:08   Mr Brain Wo Contrast  Result Date: 09/10/2017 CLINICAL DATA:  Abnormal speech for 3 days, acute fusion for 2 days. History of carotid endarterectomy, breast cancer, colon cancer, hypertension and atrial fibrillation. EXAM: MRI HEAD WITHOUT CONTRAST TECHNIQUE: Multiplanar, multiecho pulse sequences of the brain and surrounding structures were obtained without intravenous contrast. COMPARISON:  CT HEAD September 10, 2017 at 1451 hours FINDINGS: Multiple sequences are moderately motion degraded. INTRACRANIAL CONTENTS: 6 x 14 mm reduced diffusion LEFT lenticulostriate nucleus with low ADC values. No susceptibility artifact to suggest hemorrhage. No midline shift, mass effect or masses. Ventricles and sulci are normal for patient's age. Prominent basal ganglia and thalami perivascular spaces associated with chronic small vessel ischemic disease. Confluent supratentorial white matter FLAIR T2 hyperintensities. No abnormal extra-axial fluid collections. VASCULAR: Normal major intracranial vascular flow voids present at skull base. SKULL AND UPPER CERVICAL SPINE: No abnormal sellar expansion. No suspicious calvarial bone marrow signal. Craniocervical junction maintained. SINUSES/ORBITS: Severe LEFT maxillary sinusitis with antral expansion associated with mucocele. Moderate fronto ethmoidal sinusitis.  Included ocular globes and orbital contents are non-suspicious. Status post bilateral ocular lens implants. OTHER: Patient is edentulous. IMPRESSION: 1. Moderately motion degraded examination. 2. Acute 6 x 14 mm LEFT basal ganglia nonhemorrhagic infarct. 3. Moderate to severe chronic small vessel ischemic disease. Electronically Signed   By: Elon Alas M.D.   On: 09/10/2017 18:10   ECHO 09/11/17 ------------------------------------------------------------------- Study Conclusions  - Left ventricle: The cavity size was normal. Systolic function was   vigorous. The estimated ejection fraction was in the range of 65%   to 70%. Doppler parameters are consistent with abnormal left   ventricular relaxation (grade 1 diastolic dysfunction).  Impressions:  - No cardiac source of emboli was indentified.   Discharge Exam: Vitals:   09/11/17 1348 09/11/17  1352  BP: 140/68 140/68  Pulse: 89 89  Resp:    Temp:    SpO2:  97%   Vitals:   09/11/17 0545 09/11/17 1017 09/11/17 1348 09/11/17 1352  BP: (!) 156/76 (!) 145/91 140/68 140/68  Pulse: 86 86 89 89  Resp: 18 18    Temp: 97.6 F (36.4 C) (!) 97.4 F (36.3 C)    TempSrc: Oral Oral    SpO2: 95% 97%  97%  Weight:      Height:        General: Pt is alert, awake, not in acute distress Cardiovascular: RRR, S1/S2 +, no rubs, no gallops Respiratory: CTA bilaterally, no wheezing, no rhonchi Abdominal: Soft, NT, ND, bowel sounds + Extremities: no edema, strength 5/5  Neuro: dysarthria, no other focal findings   The results of significant diagnostics from this hospitalization (including imaging, microbiology, ancillary and laboratory) are listed below for reference.     Microbiology: No results found for this or any previous visit (from the past 240 hour(s)).   Labs: BNP (last 3 results) No results for input(s): BNP in the last 8760 hours. Basic Metabolic Panel: Recent Labs  Lab 09/10/17 1332 09/10/17 1359 09/10/17 2124  09/11/17 0845  NA 140 141  --  140  K 3.6 3.5  --  3.1*  CL 103 101  --  103  CO2 24  --   --  26  GLUCOSE 101* 99  --  112*  BUN 17 19  --  14  CREATININE 0.85 0.80 0.83 0.86  CALCIUM 9.7  --   --  9.1   Liver Function Tests: Recent Labs  Lab 09/10/17 1332 09/11/17 0845  AST 18 16  ALT 12* 11*  ALKPHOS 68 60  BILITOT 0.8 1.1  PROT 7.0 6.1*  ALBUMIN 3.7 3.2*   No results for input(s): LIPASE, AMYLASE in the last 168 hours. No results for input(s): AMMONIA in the last 168 hours. CBC: Recent Labs  Lab 09/10/17 1332 09/10/17 1359 09/10/17 2124 09/11/17 0845  WBC 8.0  --  8.1 7.1  NEUTROABS 4.3  --   --   --   HGB 14.7 14.6 15.0 13.6  HCT 44.3 43.0 44.1 40.6  MCV 95.5  --  94.0 93.8  PLT 215  --  217 209   Cardiac Enzymes: No results for input(s): CKTOTAL, CKMB, CKMBINDEX, TROPONINI in the last 168 hours. BNP: Invalid input(s): POCBNP CBG: No results for input(s): GLUCAP in the last 168 hours. D-Dimer No results for input(s): DDIMER in the last 72 hours. Hgb A1c Recent Labs    09/11/17 0845  HGBA1C 5.2   Lipid Profile Recent Labs    09/11/17 0845  CHOL 204*  HDL 37*  LDLCALC 129*  TRIG 190*  CHOLHDL 5.5   Thyroid function studies No results for input(s): TSH, T4TOTAL, T3FREE, THYROIDAB in the last 72 hours.  Invalid input(s): FREET3 Anemia work up No results for input(s): VITAMINB12, FOLATE, FERRITIN, TIBC, IRON, RETICCTPCT in the last 72 hours. Urinalysis    Component Value Date/Time   COLORURINE YELLOW 09/10/2017 1332   APPEARANCEUR CLEAR 09/10/2017 1332   LABSPEC 1.006 09/10/2017 1332   PHURINE 7.0 09/10/2017 1332   GLUCOSEU NEGATIVE 09/10/2017 1332   HGBUR SMALL (A) 09/10/2017 1332   BILIRUBINUR NEGATIVE 09/10/2017 1332   KETONESUR NEGATIVE 09/10/2017 1332   PROTEINUR NEGATIVE 09/10/2017 1332   UROBILINOGEN 0.2 09/09/2016 1811   NITRITE POSITIVE (A) 09/10/2017 1332   LEUKOCYTESUR SMALL (A) 09/10/2017 1332  Sepsis Labs Invalid  input(s): PROCALCITONIN,  WBC,  LACTICIDVEN Microbiology No results found for this or any previous visit (from the past 240 hour(s)).   Time coordinating discharge: 32 minutes  SIGNED:  Chipper Oman, MD  [facility] 09/11/2017, 2:17 PM  Pager please text page via  [email or web address]

## 2017-09-11 NOTE — Evaluation (Signed)
Physical Therapy Evaluation Patient Details Name: Brandy Huff MRN: [id number] DOB: Sep 14, 1917 Today's Date: 09/11/2017   History of Present Illness  84 y.o. female with medical history significant of Blindness and mild dementia, who was brought by her family due to dysarthria.Head CT with chronic atrophic and ischemic changes without acute abnormality. MRI revealed Acute 6 x 14 mm LEFT basal ganglia nonhemorrhagic infarct.  Clinical Impression  Pt admitted with above diagnosis. Pt currently with functional limitations due to the deficits listed below (see PT Problem List). PTA pt was living in her own home with family members staying with her 24 hr/day. Pt was ambulating household distances with RW and able to dress her self, she was dependent on family for bathing and additional iADLs. Pt currently limited by generalized weakness and decreased endurance. Pt is currently supervision for bed mobility, and transfers and hands on min guard for ambulation of 80 feet with RW. Pt will benefit from skilled PT to increase their independence and safety with mobility to allow discharge to the venue listed below.       Follow Up Recommendations Home health PT;Supervision/Assistance - 24 hour    Equipment Recommendations  None recommended by PT       Precautions / Restrictions Precautions Precautions: Fall Restrictions Weight Bearing Restrictions: No      Mobility  Bed Mobility Overal bed mobility: Needs Assistance Bed Mobility: Supine to Sit     Supine to sit: Supervision     General bed mobility comments: use of bed rail to assist in scooting hips to EoB  Transfers Overall transfer level: Needs assistance Equipment used: Rolling walker (2 wheeled) Transfers: Sit to/from Stand Sit to Stand: Supervision         General transfer comment: supervision for safety, good power up and steadying in standing  Ambulation/Gait Ambulation/Gait assistance: Min guard Ambulation Distance  (Feet): 80 Feet Assistive device: Rolling walker (2 wheeled) Gait Pattern/deviations: Decreased step length - right;Decreased step length - left;Shuffle;Drifts right/left;Step-through pattern;Trunk flexed Gait velocity: slowed Gait velocity interpretation: Below normal speed for age/gender General Gait Details: hands on min guard for safety, vc for upright posture and proximity to RW, verbal and tactile cues for navigation due to decreased vision   Modified Rankin (Stroke Patients Only) Modified Rankin (Stroke Patients Only) Pre-Morbid Rankin Score: Moderate disability Modified Rankin: Moderate disability     Balance Overall balance assessment: Needs assistance Sitting-balance support: No upper extremity supported;Feet supported Sitting balance-Leahy Scale: Fair     Standing balance support: Bilateral upper extremity supported Standing balance-Leahy Scale: Poor                               Pertinent Vitals/Pain Pain Assessment: No/denies pain    Home Living Family/patient expects to be discharged to:: Private residence Living Arrangements: Alone(family rotates staying with her 24 hr/day) Available Help at Discharge: Family;Available 24 hours/day Type of Home: House Home Access: Stairs to enter Entrance Stairs-Rails: None Entrance Stairs-Number of Steps: 1 Home Layout: One level Home Equipment: Shower seat - built in;Walker - 2 wheels;Bedside commode Additional Comments: (uses 3 in 1 over commode)    Prior Function Level of Independence: Needs assistance   Gait / Transfers Assistance Needed: ambulated with RW with assist prn as pt is blind  ADL's / Homemaking Assistance Needed: assisted for showers and all IADL         Hand Dominance   Dominant Hand: Right  Extremity/Trunk Assessment   Upper Extremity Assessment Upper Extremity Assessment: Generalized weakness;Defer to OT evaluation    Lower Extremity Assessment Lower Extremity Assessment: RLE  deficits/detail;LLE deficits/detail RLE Deficits / Details: AROM WFL, strength grossly 4/5 RLE Sensation: (intact) RLE Coordination: decreased fine motor LLE Deficits / Details: AROM WFL, strength grossly 4/5 LLE Sensation: (intact) LLE Coordination: decreased fine motor    Cervical / Trunk Assessment Cervical / Trunk Assessment: Kyphotic  Communication   Communication: No difficulties  Cognition Arousal/Alertness: Awake/alert Behavior During Therapy: WFL for tasks assessed/performed Overall Cognitive Status: Impaired/Different from baseline Area of Impairment: Orientation;Problem solving;Following commands;Memory                 Orientation Level: Disoriented to;Time   Memory: (couldn't remember birthdate or correct age) Following Commands: Follows multi-step commands with increased time     Problem Solving: Slow processing;Requires verbal cues;Requires tactile cues               Assessment/Plan    PT Assessment Patient needs continued PT services  PT Problem List Decreased strength;Decreased mobility;Decreased activity tolerance;Decreased balance       PT Treatment Interventions DME instruction;Gait training;Stair training;Functional mobility training;Therapeutic activities;Therapeutic exercise;Balance training;Neuromuscular re-education;Cognitive remediation;Patient/family education    PT Goals (Current goals can be found in the Care Plan section)  Acute Rehab PT Goals Patient Stated Goal: go home PT Goal Formulation: With patient Time For Goal Achievement: 09/25/17 Potential to Achieve Goals: Good    Frequency Min 4X/week    AM-PAC PT "6 Clicks" Daily Activity  Outcome Measure Difficulty turning over in bed (including adjusting bedclothes, sheets and blankets)?: A Little Difficulty moving from lying on back to sitting on the side of the bed? : A Little Difficulty sitting down on and standing up from a chair with arms (e.g., wheelchair, bedside  commode, etc,.)?: A Little Help needed moving to and from a bed to chair (including a wheelchair)?: A Little Help needed walking in hospital room?: A Little Help needed climbing 3-5 steps with a railing? : A Lot 6 Click Score: 17    End of Session Equipment Utilized During Treatment: Gait belt Activity Tolerance: Patient tolerated treatment well Patient left: in chair;with call bell/phone within reach;with chair alarm set;with family/visitor present Nurse Communication: Mobility status PT Visit Diagnosis: Other abnormalities of gait and mobility (R26.89);Muscle weakness (generalized) (M62.81);Difficulty in walking, not elsewhere classified (R26.2);Other symptoms and signs involving the nervous system (K74.259)    Time: 5638-7564 PT Time Calculation (min) (ACUTE ONLY): 32 min   Charges:   PT Evaluation $PT Eval Moderate Complexity: 1 Mod PT Treatments $Gait Training: 8-22 mins   PT G Codes:        [place] Petrovich B. Migdalia Dk PT, DPT Acute Rehabilitation  [phone] Pager [phone]    [place] 09/11/2017, 1:02 PM

## 2017-09-11 NOTE — Evaluation (Signed)
Speech Language Pathology Evaluation Patient Details Name: Brandy Huff MRN: [id number] DOB: 1918/02/03 Today's Date: 09/11/2017 Time: 3220-2542 SLP Time Calculation (min) (ACUTE ONLY): 20 min  Problem List:  Patient Active Problem List   Diagnosis Date Noted  . CVA (cerebral vascular accident) ([place]) 09/10/2017  . Falls 12/22/2016  . Acute encephalopathy 12/21/2016  . Malignant hypertension 12/21/2016  . Hypokalemia 12/21/2016  . Pleural effusion associated with pulmonary infection 12/21/2016  . History of total colectomy - after colon Ca x 2 10/30/2016  . Community acquired pneumonia 10/30/2016  . Essential hypertension 10/30/2016  . Hypothyroidism 10/30/2016  . Volume depletion 10/30/2016  . Generalized weakness 10/30/2016  . Dermatophytosis of nail 05/11/2013  . Aftercare following surgery of the circulatory system, [facility] 09/29/2012  . Occlusion and stenosis of carotid artery without mention of cerebral infarction 03/17/2012   Past Medical History:  Past Medical History:  Diagnosis Date  . Arthritis   . Atrial fibrillation ([place])   . Cancer ([place])    Breast  . Cancer ([place])    Colon  . Cancer ([facility])    Skin  . Carotid artery occlusion   . Dermatophytosis of nail   . Fall from slipping Nov. 2013  . Hypertension   . PONV (postoperative nausea and vomiting)   . Thyroid disease    Past Surgical History:  Past Surgical History:  Procedure Laterality Date  . ABDOMINAL HYSTERECTOMY    . APPENDECTOMY    . CAROTID ENDARTERECTOMY  2012   right  . CATARACT EXTRACTION     bilateral  . COLON SURGERY     Colectomy X 2  . ENDARTERECTOMY  02/08/2012   Procedure: ENDARTERECTOMY CAROTID;  Surgeon: Serafina Mitchell, MD;  [facility] OR;  Service: Vascular;  Laterality: Left;  and Patch Angioplasty  . HERNIA REPAIR     Left Ventral hernia  . JOINT REPLACEMENT  2005   Bilateral knee replacements  . MASTECTOMY     Right Breast   HPI:  84 year old female admitted 09/10/17 with  confusion and dysarthria. PMH significant for blindness, mild dementia. MRI = Left basal ganglia infarct, CXR = no active disease.   Assessment / Plan / Recommendation Clinical Impression  The Mini-Mental State exam (MMSE) was administered. Pt scored 13/27 (reading, writing, and copying not assessed due to blindness). This score indicates moderate-severe cognitive impairment, which is in line with baseline history of dementia. Pt speech intelligibility is reduced, due to mild oral motor weakness. 24 hour supervision is recommended at DC, given advanced age, blindness, and cognitive impairment. No further ST intervention is recommended at this venue, as pt status is not significantly different from baseline.     SLP Assessment  SLP Recommendation/Assessment: All further Speech Lanaguage Pathology  needs can be addressed in the next venue of care SLP Visit Diagnosis: Cognitive communication deficit (R41.841)    Follow Up Recommendations  24 hour supervision/assistance    Frequency and Duration min 1 x/week         SLP Evaluation Cognition  Overall Cognitive Status: History of cognitive impairments - at baseline Arousal/Alertness: Awake/alert Orientation Level: Oriented to person;Oriented to place;Disoriented to time;Disoriented to situation Attention: Focused;Sustained Focused Attention: Appears intact Sustained Attention: Impaired Sustained Attention Impairment: Verbal basic Memory: Impaired Memory Impairment: Retrieval deficit;Decreased recall of new information       Comprehension  Auditory Comprehension Overall Auditory Comprehension: Appears within functional limits for tasks assessed Yes/No Questions: Within Functional Limits Commands: Within Functional Limits Conversation: Simple  Interfering Components: Hearing;Working memory;Visual impairments EffectiveTechniques: Increased volume;Slowed speech;Repetition Visual Recognition/Discrimination Discrimination: Not  tested Reading Comprehension Reading Status: Not tested    Expression Expression Primary Mode of Expression: Verbal Verbal Expression Overall Verbal Expression: Appears within functional limits for tasks assessed Initiation: No impairment Repetition: No impairment Naming: No impairment Interfering Components: Speech intelligibility Written Expression Dominant Hand: Right Written Expression: Not tested   Oral / Motor  Oral Motor/Sensory Function Overall Oral Motor/Sensory Function: Mild impairment Facial ROM: Reduced right Facial Symmetry: Abnormal symmetry right Facial Strength: Reduced right Facial Sensation: Within Functional Limits Lingual ROM: Within Functional Limits Lingual Symmetry: Within Functional Limits Lingual Strength: Within Functional Limits Lingual Sensation: Within Functional Limits Velum: Within Functional Limits Mandible: Within Functional Limits Motor Speech Overall Motor Speech: Impaired Respiration: Within functional limits Phonation: Normal Resonance: Within functional limits Articulation: Impaired Level of Impairment: Sentence Intelligibility: Intelligibility reduced Word: 75-100% accurate Phrase: 50-74% accurate Sentence: 50-74% accurate Conversation: 50-74% accurate Motor Planning: Witnin functional limits   GO                   Gem Conkle B. Quentin Ore [facility], CCC-SLP Speech Language Pathologist [phone]  Shonna Chock 09/11/2017, 10:37 AM

## 2017-09-11 NOTE — Plan of Care (Signed)
  Progressing SLP Dysphagia Goals Patient will utilize recommended strategies Description Patient will utilize recommended strategies during swallow to increase swallowing safety with 09/11/2017 1029 - Progressing by Colon Flattery B, CCC-SLP Flowsheets Taken 09/11/2017 1029  Patient will utilize recommended strategies during swallow to increase swallowing safety with  min assist

## 2017-09-11 NOTE — Care Management Note (Signed)
Case Management Note  Patient Details  Name: Brandy Huff MRN: [id number] Date of Birth: 1918/01/21  Subjective/Objective:                    Action/Plan: Pt discharging home with orders for [facility] services. CM met with the patient and her son and provided choice of [place] agencies. The son wanted to wait on his sister to select the agency. Pts daughter, Brandy Huff, called and asked to use [facility]. Cory with [facility] notified and accepted the referral.  Pt has transportation home.  Best contact numbers: [phone] and [name] --[phone]  Expected Discharge Date:  09/11/17               Expected Discharge Plan:  [place]  In-House Referral:     Discharge planning Services  CM Consult  Post Acute Care Choice:  Home Health Choice offered to:  Patient, Adult Children  DME Arranged:    DME Agency:     HH Arranged:  PT, OT, Speech Therapy, Social Work [place] Agency:  [facility]  Status of Service:  Completed, signed off  If discussed at [employer] of Stay Meetings, dates discussed:    Additional Comments:  Pollie Friar, RN 09/11/2017, 2:54 PM

## 2017-09-11 NOTE — Evaluation (Signed)
Clinical/Bedside Swallow Evaluation Patient Details  Name: Brandy Huff MRN: [id number] Date of Birth: 01/29/1918  Today's Date: 09/11/2017 Time: SLP Start Time (ACUTE ONLY): 0945 SLP Stop Time (ACUTE ONLY): 1005 SLP Time Calculation (min) (ACUTE ONLY): 20 min  Past Medical History:  Past Medical History:  Diagnosis Date  . Arthritis   . Atrial fibrillation ([place])   . Cancer ([place])    Breast  . Cancer ([place])    Colon  . Cancer ([facility])    Skin  . Carotid artery occlusion   . Dermatophytosis of nail   . Fall from slipping Nov. 2013  . Hypertension   . PONV (postoperative nausea and vomiting)   . Thyroid disease    Past Surgical History:  Past Surgical History:  Procedure Laterality Date  . ABDOMINAL HYSTERECTOMY    . APPENDECTOMY    . CAROTID ENDARTERECTOMY  2012   right  . CATARACT EXTRACTION     bilateral  . COLON SURGERY     Colectomy X 2  . ENDARTERECTOMY  02/08/2012   Procedure: ENDARTERECTOMY CAROTID;  Surgeon: Serafina Mitchell, MD;  Location: [facility] OR;  Service: Vascular;  Laterality: Left;  and Patch Angioplasty  . HERNIA REPAIR     Left Ventral hernia  . JOINT REPLACEMENT  2005   Bilateral knee replacements  . MASTECTOMY     Right Breast   HPI:  84 year old female admitted 09/10/17 with confusion and dysarthria. PMH significant for blindness, mild dementia. MRI = Left basal ganglia infarct, CXR = no active disease.   Assessment / Plan / Recommendation Clinical Impression  Pt presents with ill fitting dentures, dry mouth, and mild right facial weakness. Speech intelligibility is reduced due to oral motor weakness. Pt was given trials of ice chips, thin liquid, puree, and solid consistencies. No overt s/s aspiration observed on any consistency.  Extended oral prep of solids was noted, due to ill fitting dentures. Family was encouraged to bring dental adhesive to facilitate proper fit.   At this time, will begin soft diet with chopped meats, thin liquids. Safe swallow  precautions were posted at [facility]. ST will follow for diet tolerance and education. Pt, RN and family aware.    SLP Visit Diagnosis: Dysphagia, unspecified (R13.10)    Aspiration Risk  Mild aspiration risk    Diet Recommendation Dysphagia 3 (Mech soft);Thin liquid(chop meats)   Liquid Administration via: Cup;Straw Medication Administration: Whole meds with liquid Supervision: Patient able to self feed;Intermittent supervision to cue for compensatory strategies;Staff to assist with self feeding Compensations: Minimize environmental distractions;Slow rate;Small sips/bites Postural Changes: Seated upright at 90 degrees    Other  Recommendations Oral Care Recommendations: Oral care BID   Follow up Recommendations 24 hour supervision/assistance      Frequency and Duration min 1 x/week  1 week;2 weeks       Prognosis Prognosis for Safe Diet Advancement: Fair Barriers to Reach Goals: Cognitive deficits(advanced age)      Ricka Burdock Study   General Date of Onset: 09/10/17 HPI: 84 year old female admitted 09/10/17 with confusion and dysarthria. PMH significant for blindness, mild dementia. MRI = Left basal ganglia infarct, CXR = no active disease. Type of Study: Bedside Swallow Evaluation Previous Swallow Assessment: no prior ST intervention Diet Prior to this Study: NPO Temperature Spikes Noted: No Respiratory Status: Room air History of Recent Intubation: No Behavior/Cognition: Alert;Cooperative;Pleasant mood Oral Cavity Assessment: Dry Oral Care Completed by SLP: No Oral Cavity - Dentition: Dentures, top Vision: Functional  for self-feeding Self-Feeding Abilities: Able to feed self;Needs set up;Needs assist Patient Positioning: Upright in chair Baseline Vocal Quality: Normal Volitional Cough: Strong Volitional Swallow: Able to elicit    Oral/Motor/Sensory Function Overall Oral Motor/Sensory Function: Mild impairment Facial ROM: Reduced right Facial Symmetry: Abnormal symmetry  right Facial Strength: Reduced right Facial Sensation: Within Functional Limits Lingual ROM: Within Functional Limits Lingual Symmetry: Within Functional Limits Lingual Strength: Within Functional Limits Lingual Sensation: Within Functional Limits Velum: Within Functional Limits Mandible: Within Functional Limits   Ice Chips Ice chips: Within functional limits Presentation: Spoon   Thin Liquid Thin Liquid: Within functional limits Presentation: Cup;Straw    Nectar Thick Nectar Thick Liquid: Not tested   Honey Thick Honey Thick Liquid: Not tested   Puree Puree: Within functional limits Presentation: Spoon   Solid   GO   Solid: Impaired Oral Phase Functional Implications: Prolonged oral transit       Brandy Huff B. Brandy Huff, [facility], [place] Speech Language Pathologist [phone]  Shonna Chock 09/11/2017,10:27 AM

## 2017-09-11 NOTE — Progress Notes (Signed)
Echocardiogram 2D Echocardiogram has been performed.  09/11/2017 11:14 AM Maudry Mayhew, BS, RVT, RDCS, RDMS

## 2017-09-11 NOTE — Progress Notes (Addendum)
Removed IV and telemetry. Pt calm, no new concerns. Family/caregivers and patient educated on follow up instructions and discharge information. Pt and caregivers educated and able to teach back. No new questions.   Pt left unit at 1800

## 2017-09-11 NOTE — Evaluation (Signed)
Occupational Therapy Evaluation Patient Details Name: Brandy Huff MRN: [id number] DOB: [birth year] Today's Date: 09/11/2017    History of Present Illness 84 y.o. female with medical history significant of Blindness and mild dementia, who was brought by her family due to dysarthria.Head CT with chronic atrophic and ischemic changes without acute abnormality. MRI revealed Acute 6 x 14 mm LEFT basal ganglia nonhemorrhagic infarct.   Clinical Impression   Patient evaluated by Occupational Therapy with no further acute OT needs identified. All education has been completed and the patient has no further questions. Pt presents to OT with generalized weakness, but is close to baseline with ADLs.  She has great family support with plan to return to her home with 24 hour assist.  She does not need further OT, and has all DME.    See below for any follow-up Occupational Therapy or equipment needs. OT is signing off. Thank you for this referral.      Follow Up Recommendations  No OT follow up;Supervision/Assistance - 24 hour    Equipment Recommendations  None recommended by OT    Recommendations for Other Services       Precautions / Restrictions Precautions Precautions: Fall Restrictions Weight Bearing Restrictions: No      Mobility Bed Mobility Overal bed mobility: Needs Assistance Bed Mobility: Supine to Sit;Sit to Supine     Supine to sit: Supervision Sit to supine: Supervision   General bed mobility comments: use of bed rail to assist in scooting hips to EoB  Transfers Overall transfer level: Needs assistance Equipment used: Rolling walker (2 wheeled) Transfers: Sit to/from [facility] Sit to Stand: Supervision Stand pivot transfers: Supervision       General transfer comment: supervision for safety, good power up and steadying in standing    Balance Overall balance assessment: Needs assistance Sitting-balance support: No upper extremity  supported;Feet supported Sitting balance-Leahy Scale: Good Sitting balance - Comments: able to don/doff socks EOB    Standing balance support: No upper extremity supported;During functional activity Standing balance-Leahy Scale: Fair                             ADL either performed or assessed with clinical judgement   ADL Overall ADL's : Needs assistance/impaired Eating/Feeding: Set up;Minimal assistance;Sitting Eating/Feeding Details (indicate cue type and reason): due to impaired vision  Grooming: Wash/dry hands;Wash/dry face;Oral care;Brushing hair;Min guard;Standing   Upper Body Bathing: Set up;Supervision/ safety;Sitting   Lower Body Bathing: Minimal assistance;Sit to/from stand   Upper Body Dressing : Set up;Supervision/safety;Sitting   Lower Body Dressing: Minimal assistance;Sit to/from stand   Toilet Transfer: Minimal assistance;Ambulation;Comfort height toilet;Grab bars;RW Armed forces technical officer Details (indicate cue type and reason): assist to maneuver RW into BR due to small space  Toileting- Clothing Manipulation and Hygiene: Minimal assistance;Sit to/from stand Toileting - Clothing Manipulation Details (indicate cue type and reason): due to visual deficits in unfamiliar environment      Functional mobility during ADLs: Min guard;Minimal assistance;Rolling walker General ADL Comments: Per family, she is close to baseline.  They report she is moving slower, and speech is mildly slurred     Vision Baseline Vision/History: Legally blind;Macular Degeneration Patient Visual Report: No change from baseline       Perception Perception Perception Tested?: Yes   Praxis Praxis Praxis tested?: Within functional limits    Pertinent Vitals/Pain Pain Assessment: No/denies pain     Hand Dominance Right   Extremity/Trunk Assessment Upper Extremity  Assessment Upper Extremity Assessment: Generalized weakness   Lower Extremity Assessment Lower Extremity  Assessment: Defer to PT evaluation RLE Deficits / Details: AROM WFL, strength grossly 4/5 RLE Sensation: (intact) RLE Coordination: decreased fine motor LLE Deficits / Details: AROM WFL, strength grossly 4/5 LLE Sensation: (intact) LLE Coordination: decreased fine motor   Cervical / Trunk Assessment Cervical / Trunk Assessment: Kyphotic   Communication Communication Communication: No difficulties   Cognition Arousal/Alertness: Awake/alert Behavior During Therapy: WFL for tasks assessed/performed Overall Cognitive Status: History of cognitive impairments - at baseline Area of Impairment: Orientation;Problem solving;Following commands;Memory                 Orientation Level: Disoriented to;Time   Memory: (couldn't remember birthdate or correct age) Following Commands: Follows multi-step commands with increased time     Problem Solving: Slow processing;Requires verbal cues;Requires tactile cues     General Comments  Family present during eval     Exercises     Shoulder Instructions      Home Living Family/patient expects to be discharged to:: Private residence Living Arrangements: Alone(family rotates staying with her 24 hr/day) Available Help at Discharge: Family;Available 24 hours/day Type of Home: House Home Access: Stairs to enter [employer] of Steps: 1 Entrance Stairs-Rails: None Home Layout: One level     Bathroom Shower/Tub: Occupational psychologist: Standard Bathroom Accessibility: Yes   Home Equipment: Shower seat - built in;Walker - 2 wheels;Bedside commode   Additional Comments: (uses 3 in 1 over commode)  Lives With: Family;Daughter;Son    Prior Functioning/Environment Level of Independence: Needs assistance  Gait / Transfers Assistance Needed: ambulated with RW with assist prn as pt is blind ADL's / Homemaking Assistance Needed: assisted for showers and all IADL             OT Problem List: Decreased activity  tolerance;Impaired balance (sitting and/or standing);Impaired vision/perception      OT Treatment/Interventions:      OT Goals(Current goals can be found in the care plan section) Acute Rehab OT Goals Patient Stated Goal: go home OT Goal Formulation: All assessment and education complete, DC therapy  OT Frequency:     Barriers to D/C:            Co-evaluation              AM-PAC PT "6 Clicks" Daily Activity     Outcome Measure Help from another person eating meals?: A Little Help from another person taking care of personal grooming?: A Little Help from another person toileting, which includes using toliet, bedpan, or urinal?: A Little Help from another person bathing (including washing, rinsing, drying)?: A Little Help from another person to put on and taking off regular upper body clothing?: A Little Help from another person to put on and taking off regular lower body clothing?: A Little 6 Click Score: 18   End of Session Equipment Utilized During Treatment: Rolling walker Nurse Communication: Mobility status  Activity Tolerance: Patient tolerated treatment well Patient left: in bed;with call bell/phone within reach;with family/visitor present  OT Visit Diagnosis: Unsteadiness on feet (R26.81);Low vision, both eyes (H54.2)                Time: 1610-9604 OT Time Calculation (min): 28 min Charges:  OT General Charges $OT Visit: 1 Visit OT Evaluation $OT Eval Moderate Complexity: 1 Mod OT Treatments $Self Care/Home Management : 8-22 mins G-Codes:     [facility], OTR/L [phone]   Lucille Passy  M 09/11/2017, 1:51 PM

## 2017-09-12 ENCOUNTER — Telehealth: Payer: Self-pay | Admitting: Neurology

## 2017-09-12 NOTE — Telephone Encounter (Signed)
Rn spoke with Claris Pong. The pt was not seen by Dr. Jaynee Eagles, Dr. Leonie Man, or Dr Erlinda Hong in the hospital. Rn explain typically pts see their PCP, and follow within 4 to 6 weeks at our office. Claris Pong will give pts daughter the first available.

## 2017-09-12 NOTE — Telephone Encounter (Signed)
When you get a chance, can you take a look and see if we could get this pt in soon. she was seen in the hospital on 1/22 and the discharge notes say for her to sched an apt w/ Sethi in 2 weeks for stroke.

## 2017-09-12 NOTE — Discharge Summary (Signed)
Physician Discharge Summary  Brandy Huff  ZRA:[id number]  DOB: Jul 31, 1918  DOA: 09/10/2017  PCP: [name], MD  Admit date: 09/10/2017  Discharge date: 09/11/2017  Admitted From: Home  Disposition: Home  Recommendations for Outpatient Follow-up:  1. Follow up with PCP in 1-2 weeks 2. Follow up with Neurology to establish care in 2-4 weeks  3. Please obtain BMP/CBC in one week 4. May consider Carotid doppler as outpatient  Home Health: PT/OT, Speech  Discharge Condition: Stable  CODE STATUS: DNR  Diet recommendation: Heart Healthy  Brief/Interim Summary:  For full details see H&P/progress note but in brief, Brandy Huff is a 84 year old female with medical history significant for hypertension, hypothyroidism, dementia and blindness, presented to the emergency department due to confusion and dysarthria. Upon ED evaluation she was found to have acute stroke on MRI. Patient was admitted for further evaluation. Family want conservative measures. Neurology evaluated patient and recommended Plavix, but family decided to continue ASA only, given risk of fall. Patient is stable and was discharged home to follow up with PCP.  Subjective:  Patient seen and examined, report feeling well. Still with some issues of dysarthria. No acute events overnight. Patient participated with PT with no issues.  Discharge Diagnoses/Hospital Course:  Metabolic encephalopathy  Due to acute CVA  See below  Acute CVA - Dysarthria  MRI shows acute non hemorrhagic infarct on the left basal ganglia.  ECHO shows EF 65-70% and grade 1 diastolic dysfunction  Family decided to not pursued carotid evaluation as findings will not change management, family not interested on surgical procedure if carotids are stenosed  Patient on ASA 325 mg prior to admission, neurology recommended to consider Plavix, discussed with family risk and benefits, they have decided not to start Plavix and patient is high risk for falls,  given blindness.  Statin increased to 80 mg  Follow up as outpatient, recommended neurology follow up in 2-4 weeks  HTN  BP stable during hospital stay  Continue home medications with no changes  Hypothyroidism  Stable  Continue Synthroid  PAF  HR well controlled  Not on a/c due high fall risk  Dementia  Mentally at baseline  All other chronic medical condition were stable during the hospitalization.  Patient was seen by physical therapy, home health PT  On the day of the discharge the patient's vitals were stable, and no other acute medical condition were reported by patient. the patient was felt safe to be discharge to home  Discharge Instructions  You were cared for by a hospitalist during your hospital stay. If you have any questions about your discharge medications or the care you received while you were in the hospital after you are discharged, you can call the unit and asked to speak with the hospitalist on call if the hospitalist that took care of you is not available. Once you are discharged, your primary care physician will handle any further medical issues. Please note that NO REFILLS for any discharge medications will be authorized once you are discharged, as it is imperative that you return to your primary care physician (or establish a relationship with a primary care physician if you do not have one) for your aftercare needs so that they can reassess your need for medications and monitor your lab values.      Discharge Instructions     Call MD for: difficulty breathing, headache or visual disturbances  Complete by: As directed    Call MD for: extreme fatigue  Complete by: As directed    Call MD for: hives  Complete by: As directed    Call MD for: persistant dizziness or light-headedness  Complete by: As directed    Call MD for: persistant nausea and vomiting  Complete by: As directed    Call MD for: redness, tenderness, or signs of infection (pain, swelling, redness, odor or  green/yellow discharge around incision site)  Complete by: As directed    Call MD for: severe uncontrolled pain  Complete by: As directed    Call MD for: temperature >100.4  Complete by: As directed    Diet - low sodium heart healthy  Complete by: As directed    Increase activity slowly  Complete by: As directed           Allergies as of 09/11/2017      Reactions   Quinolones Other (See Comments)   AMS > noted with Levofloxacin and (hallucinations and "took 6 weeks to get this out of her system"   Adhesive [tape] Other (See Comments)   Tape TEARS THE [place]!!!!   Levaquin [levofloxacin In D5w] Other (See Comments)   Altered Mental Status, admitted to hospital with CNS toxicity                Medication List      STOP taking these medications    CALMOSEPTINE EX       TAKE these medications    acetaminophen 325 MG tablet  Commonly known as: TYLENOL  Take 325 mg by mouth every 6 (six) hours as needed (for pain or headaches).   amLODipine 5 MG tablet  Commonly known as: NORVASC  Take 1 tablet (5 mg total) by mouth daily.   aspirin EC 325 MG tablet  Take 325 mg by mouth daily.   dorzolamide-timolol 22.3-6.8 MG/ML ophthalmic solution  Commonly known as: COSOPT  Place 1 drop into the right eye 2 (two) times daily.   hydrocortisone 2.5 % cream  Apply 1 application topically every other day. UNDER THE LEFT BREAST   levothyroxine 112 MCG tablet  Commonly known as: SYNTHROID, LEVOTHROID  Take 112 mcg by mouth daily before breakfast.   loratadine 10 MG tablet  Commonly known as: CLARITIN  Take 10 mg by mouth daily as needed for allergies.   losartan 100 MG tablet  Commonly known as: COZAAR  Take 100 mg by mouth daily.   metoprolol succinate 25 MG 24 hr tablet  Commonly known as: TOPROL-XL  Take 1 tablet (25 mg total) by mouth 2 (two) times daily.   nystatin cream  Commonly known as: MYCOSTATIN  Apply 1 application topically every other day. UNDER  THE LEFT BREAST   SYSTANE ULTRA PF 0.4-0.3 % Soln  Generic drug: Polyethyl Glyc-Propyl Glyc PF  Place 1 drop into the left eye 2 (two) times daily.          Follow-up Information     [name], MD. Schedule an appointment as soon as possible for a visit in 1 week(s).    Specialty: Internal Medicine  Why: Hospitall follow up  Contact information:  [address] [place] [address]  [phone]        Garvin Fila, MD. Schedule an appointment as soon as possible for a visit in 2 week(s).    Specialties: Neurology, Radiology  Why: Establish care - new stroke  Contact information:  [address]  [place]  [id number]  [phone]               Allergies  Allergen Reactions  . Quinolones Other (See Comments)    AMS > noted with Levofloxacin and (hallucinations and "took 6 weeks to get this out of her system"  . Adhesive [Tape] Other (See Comments)    Tape TEARS THE SKIN BECAUSE IT IS VERY THIN!!!!  . Levaquin [Levofloxacin In D5w] Other (See Comments)    Altered Mental Status, admitted to hospital with CNS toxicity   Consultations:  Neurology  Procedures/Studies:  Imaging Results      ECHO 09/11/17  -------------------------------------------------------------------  Study Conclusions  - Left ventricle: The cavity size was normal. Systolic function was  vigorous. The estimated ejection fraction was in the range of 65%  to 70%. Doppler parameters are consistent with abnormal left  ventricular relaxation (grade 1 diastolic dysfunction).  Impressions:  - No cardiac source of emboli was indentified.  Discharge Exam:      Vitals:   09/11/17 1348 09/11/17 1352  BP: 140/68 140/68  Pulse: 89 89  Resp:    Temp:    SpO2:  97%         Vitals:   09/11/17 0545 09/11/17 1017 09/11/17 1348 09/11/17 1352  BP: (!) 156/76 (!) 145/91 140/68 140/68  Pulse: 86 86 89 89  Resp: 18 18    Temp: 97.6 F (36.4 C) (!) 97.4 F (36.3  C)    TempSrc: Oral Oral    SpO2: 95% 97%  97%  Weight:      Height:       General: Pt is alert, awake, not in acute distress  Cardiovascular: RRR, S1/S2 +, no rubs, no gallops  Respiratory: CTA bilaterally, no wheezing, no rhonchi  Abdominal: Soft, NT, ND, bowel sounds +  Extremities: no edema, strength 5/5  Neuro: dysarthria, no other focal findings  The results of significant diagnostics from this hospitalization (including imaging, microbiology, ancillary and laboratory) are listed below for reference.   Microbiology:  No results found for this or any previous visit (from the past 240 hour(s)).  Labs:  BNP (last 3 results)  Recent Labs (within last 365 days)     Basic Metabolic Panel:  Last Labs                                                                             Liver Function Tests:  Last Labs                                               Last Labs      Last Labs      CBC:  Last Labs                                                               Cardiac Enzymes:  Last Labs  BNP:  Last Labs      CBG:  Last Labs      D-Dimer  Recent Labs (last 2 labs)      Hgb A1c  Recent Labs (last 2 labs)                   Lipid Profile  Recent Labs (last 2 labs)                                   Thyroid function studies   Recent Labs (last 2 labs)      Anemia work up  [employer] (last 2 labs)      Urinalysis  Labs (Brief)                                                                                         Sepsis Labs  Last Labs      Microbiology  No results found for this or any previous visit (from the past 240 hour(s)).  Time coordinating discharge: 32 minutes  SIGNED:  Chipper Oman, MD  [facility]  09/11/2017, 2:17 PM

## 2017-09-18 ENCOUNTER — Encounter (INDEPENDENT_AMBULATORY_CARE_PROVIDER_SITE_OTHER): Payer: Self-pay

## 2017-09-18 ENCOUNTER — Ambulatory Visit: Payer: Medicare Other | Admitting: Neurology

## 2017-09-18 ENCOUNTER — Encounter: Payer: Self-pay | Admitting: Neurology

## 2017-09-18 VITALS — BP 156/87 | HR 81 | Wt 146.2 lb

## 2017-09-18 DIAGNOSIS — I6381 Other cerebral infarction due to occlusion or stenosis of small artery: Secondary | ICD-10-CM | POA: Diagnosis not present

## 2017-09-18 NOTE — Patient Instructions (Signed)
I had a long d/w patient and her daughterabout his recent stroke, risk for recurrent stroke/TIAs, personally independently reviewed imaging studies and stroke evaluation results and answered questions.Continue aspirin 325 mg daily  for secondary stroke prevention despite patient having atrial fibrillation as she is at high risk for bleeding on anticoagulation given her advanced age and frail general status.and maintain strict control of hypertension with blood pressure goal below 130/90, diabetes with hemoglobin A1c goal below 6.5% and lipids with LDL cholesterol goal below 70 mg/dL. I also advised the patient to eat a healthy diet with plenty of whole grains, cereals, fruits and vegetables, exercise regularly and maintain ideal body weight.she was encouraged to use a walker at all times and fall and safety prevention precautions were discussed.Followup in the future with me only as necessary and no scheduled appointment was made  Fall Prevention in the Home Falls can cause injuries. They can happen to people of all ages. There are many things you can do to make your home safe and to help prevent falls. What can I do on the outside of my home?  Regularly fix the edges of walkways and driveways and fix any cracks.  Remove anything that might make you trip as you walk through a door, such as a raised step or threshold.  Trim any bushes or trees on the path to your home.  Use bright outdoor lighting.  Clear any walking paths of anything that might make someone trip, such as rocks or tools.  Regularly check to see if handrails are loose or broken. Make sure that both sides of any steps have handrails.  Any raised decks and porches should have guardrails on the edges.  Have any leaves, snow, or ice cleared regularly.  Use sand or salt on walking paths during winter.  Clean up any spills in your garage right away. This includes oil or grease spills. What can I do in the bathroom?  Use night  lights.  Install grab bars by the toilet and in the tub and shower. Do not use towel bars as grab bars.  Use non-skid mats or decals in the tub or shower.  If you need to sit down in the shower, use a plastic, non-slip stool.  Keep the floor dry. Clean up any water that spills on the floor as soon as it happens.  Remove soap buildup in the tub or shower regularly.  Attach bath mats securely with double-sided non-slip rug tape.  Do not have throw rugs and other things on the floor that can make you trip. What can I do in the bedroom?  Use night lights.  Make sure that you have a light by your bed that is easy to reach.  Do not use any sheets or blankets that are too big for your bed. They should not hang down onto the floor.  Have a firm chair that has side arms. You can use this for support while you get dressed.  Do not have throw rugs and other things on the floor that can make you trip. What can I do in the kitchen?  Clean up any spills right away.  Avoid walking on wet floors.  Keep items that you use a lot in easy-to-reach places.  If you need to reach something above you, use a strong step stool that has a grab bar.  Keep electrical cords out of the way.  Do not use floor polish or wax that makes floors slippery. If you must use  wax, use non-skid floor wax.  Do not have throw rugs and other things on the floor that can make you trip. What can I do with my stairs?  Do not leave any items on the stairs.  Make sure that there are handrails on both sides of the stairs and use them. Fix handrails that are broken or loose. Make sure that handrails are as long as the stairways.  Check any carpeting to make sure that it is firmly attached to the stairs. Fix any carpet that is loose or worn.  Avoid having throw rugs at the top or bottom of the stairs. If you do have throw rugs, attach them to the floor with carpet tape.  Make sure that you have a light switch at the  top of the stairs and the bottom of the stairs. If you do not have them, ask someone to add them for you. What else can I do to help prevent falls?  Wear shoes that: ? Do not have high heels. ? Have rubber bottoms. ? Are comfortable and fit you well. ? Are closed at the toe. Do not wear sandals.  If you use a stepladder: ? Make sure that it is fully opened. Do not climb a closed stepladder. ? Make sure that both sides of the stepladder are locked into place. ? Ask someone to hold it for you, if possible.  Clearly mark and make sure that you can see: ? Any grab bars or handrails. ? First and last steps. ? Where the edge of each step is.  Use tools that help you move around (mobility aids) if they are needed. These include: ? Canes. ? Walkers. ? Scooters. ? Crutches.  Turn on the lights when you go into a dark area. Replace any light bulbs as soon as they burn out.  Set up your furniture so you have a clear path. Avoid moving your furniture around.  If any of your floors are uneven, fix them.  If there are any pets around you, be aware of where they are.  Review your medicines with your doctor. Some medicines can make you feel dizzy. This can increase your chance of falling. Ask your doctor what other things that you can do to help prevent falls. This information is not intended to replace advice given to you by your health care provider. Make sure you discuss any questions you have with your health care provider. Document Released: 06/02/2009 Document Revised: 01/12/2016 Document Reviewed: 09/10/2014 Elsevier Interactive Patient Education  [employer].

## 2017-09-18 NOTE — Progress Notes (Signed)
[facility] [address] [place]. [place] [address] [phone]       OFFICE CONSULT NOTE  [name] Date of Birth:  08-Sep-1917 Medical Record Number:  [id number]   Referring MD: Ranae Pila Reason for Referral:  stroke  HPI: [name] is a 84 year old pleasant elderly Caucasian lady who is unable to provide history which is obtained from her daughter who was present for this visit. I have personally reviewed hospital electronic medical records as well as imaging films.Brandy Huff is an 84 y.o. female with history of atrial fibrillation, carotid artery occlusion, right carotid endarterectomies, peripheral vascular disease, and hypertension.  Per family members of the last 3 days prior to presentation to [facility] ER on 09/11/17 patient  had a sinus infection however they noticed that she had some dysarthria.  The daughter was nervous that the dysarthria did not seem to fit with her sinus infection and thus brought patient to the hospital to be evaluated for possible TIA.  Patient's blood pressure, per daughter, often runs high and they were working with cardiology on this.  Apparently she is on 3 medications but still cannot get her blood pressure down below 140 and often is systolically in the 024O and 160s.  While in the hospital she did have a period of systolic 973.    Patient is blind in her right eye and has macular degeneration in her left eye.  She is clinically blind.  in the ER she was noted having dysarthria but no other localizing or lateralizing symptoms. Neuro hospitalists Dr. Rory Percy discussed with family was had about further testing.  They adamantly did not want to do a carotid Doppler as the patient does not want to go under surgery.  The patient also refused to have a MRA.  Date last known well: Date: 09/08/2017 Time last known well: Unable to determine tPA Given: No: out of window NIH stroke scale of 5-3 for visual , 1 for facial palsy, 1 for  dysarthria.Marland Kitchen MRI scan of the brain was obtained which I personally reviewed shows left basal ganglia lacunar infarct. transthoracic echo showed normal ejection fraction of 65-70% with grade 1 diastolic dysfunction.urine drug screen was negative. Urine analysis showed many bacteria and small leukocytes and positive nitrites.LDL cholesterol was elevated 126 mg percent and hemoglobin A1c was 5.2.there was discussion about changing aspirin to Plavix but the family refused. Patient was not considered to be a good long-term candidate for anticoagulation given her advanced age and poor general medical status and increased bleeding risk and fall risk due to her limited vision. Patient has done well since discharge. Her speech appears to be improved but is not back to baseline. She is swallowing better. She is on a soft diet. She is getting home physical and occupational therapy and speech therapy plans to start soon. The daughter admits that patient is little slow to respond and needs to get told the same information repeatedly since the stroke. She does have easy bruisability and even without any trauma she often gets bruising and ecchymosis. The daughter does not want long-term anticoagulation or aggressive stroke workup as patient would not be a candidate for any major therapy changes. She lives at home with her daughter and she has 24-hour care throughout the day. She is able to ambulate with walker despite having quite limited vision and does not have frequent falls     ROS:   14 system review of systems is positive for  Slurred speech,  difficulty swallowing, confusion, snoring, runny nose, feeling cold, easy bruising, incontinence, snoring, fatigue, leg swelling, trouble swallowing, rash and all other systems negative PMH:  Past Medical History:  Diagnosis Date  . Arthritis   . Atrial fibrillation ([place])   . Cancer ([place])    Breast  . Cancer ([place])    Colon  . Cancer ([facility])    Skin  . Carotid artery  occlusion   . Dermatophytosis of nail   . Fall from slipping Nov. 2013  . Hypertension   . PONV (postoperative nausea and vomiting)   . Stroke ([place])   . Thyroid disease     Social History:  Social History   Socioeconomic History  . Marital status: Widowed    Spouse name: Not on file  . Number of children: Not on file  . Years of education: Not on file  . Highest education level: Not on file  Social Needs  . Financial resource strain: Not on file  . Food insecurity - worry: Not on file  . Food insecurity - inability: Not on file  . Transportation needs - medical: Not on file  . Transportation needs - non-medical: Not on file  Occupational History  . Not on file  Tobacco Use  . Smoking status: Never Smoker  . Smokeless tobacco: Former Systems developer    Types: Snuff  Substance and Sexual Activity  . Alcohol use: No  . Drug use: No  . Sexual activity: Not on file  Other Topics Concern  . Not on file  Social History Narrative  . Not on file    Medications:   Current Outpatient Medications on File Prior to Visit  Medication Sig Dispense Refill  . acetaminophen (TYLENOL) 325 MG tablet Take 325 mg by mouth every 6 (six) hours as needed (for pain or headaches).    Marland Kitchen amLODipine (NORVASC) 5 MG tablet Take 1 tablet (5 mg total) by mouth daily. 30 tablet 0  . aspirin EC 325 MG tablet Take 325 mg by mouth daily.    . dorzolamide-timolol (COSOPT) 22.3-6.8 MG/ML ophthalmic solution Place 1 drop into the right eye 2 (two) times daily.    . hydrocortisone 2.5 % cream Apply 1 application topically every other day. UNDER THE LEFT BREAST    . levothyroxine (SYNTHROID, LEVOTHROID) 112 MCG tablet Take 112 mcg by mouth daily before breakfast.    . loratadine (CLARITIN) 10 MG tablet Take 10 mg by mouth daily as needed for allergies.    Marland Kitchen losartan (COZAAR) 100 MG tablet Take 100 mg by mouth daily.    . metoprolol succinate (TOPROL-XL) 25 MG 24 hr tablet Take 1 tablet (25 mg total) by mouth 2 (two)  times daily.    Marland Kitchen nystatin cream (MYCOSTATIN) Apply 1 application topically every other day. UNDER THE LEFT BREAST    . Polyethyl Glyc-Propyl Glyc PF (SYSTANE ULTRA PF) 0.4-0.3 % SOLN Place 1 drop into the left eye 2 (two) times daily.     No current facility-administered medications on file prior to visit.     Allergies:   Allergies  Allergen Reactions  . Quinolones Other (See Comments)    AMS > noted with Levofloxacin and  (hallucinations and "took 6 weeks to get this out of her system"  . Adhesive [Tape] Other (See Comments)    Tape TEARS THE SKIN BECAUSE IT IS VERY THIN!!!!  . Levaquin [Levofloxacin In D5w] Other (See Comments)    Altered Mental Status, admitted to hospital with CNS toxicity  Physical Exam General: frail cachectic malnourished looking elderly Caucasian lady seated, in no evident distress Head: head normocephalic and atraumatic.   Neck: supple with no carotid or supraclavicular bruits Cardiovascular: regular rate and rhythm, no murmurs Musculoskeletal: no deformity Skin:   Scattered petichiae  1+ pedal edema Vascular:  Normal pulses all extremities  Neurologic Exam Mental Status: Awake and fully alert. Oriented to place only. Recent and remote memory for. Attention span, concentration and fund of knowledge diminished. Mood and affect appropriate. Recall 0/3. Unable to name more than 3 animals. Cranial Nerves: Fundoscopic exam not done Pupils unequal, a regular and notreactive to light. Patient is blind in the right eye. She has only mild light perception in the left eye.Extraocular movements full without nystagmus. Visual fields cannot be tested. Hearing diminished bilaterally. Facial sensation intact. Face, tongue, palate moves normally and symmetrically.  Motor: Normal bulk and tone. Normal strength in all tested extremity muscles. Sensory.: intact to touch , pinprick , position and vibratory sensation.  Coordination: Rapid alternating movements normal in all  extremities. Finger-to-nose and heel-to-shin performed accurately bilaterally. Gait and Station: Arises from chair with slight difficulty. Stance is stooped. Uses a walker. Gait demonstrates short strides and mild imbalance .  Reflexes: 1+ and symmetric. Toes downgoing.   NIHSS  3 Modified Rankin  3 [place] 2 vas score of 7 points gives her 11.2% per year risk of stroke HAS-BLEd score 4 points gives her risk of 8.9% bleeds for 100 patient hears ASSESSMENT: 84 year old Caucasian lady that dysarthria and dysphagia in January 2019 secondary to left basal ganglia lacunar infarct. Vascular risk factors of hyperlipidemia, hypertension, age and atrial fibrillation    PLAN: I had a long d/w patient and her daughterabout his recent stroke, risk for recurrent stroke/TIAs, personally independently reviewed imaging studies and stroke evaluation results and answered questions.Continue aspirin 325 mg daily  for secondary stroke prevention despite patient having atrial fibrillation as she is at high risk for bleeding on anticoagulation given her advanced age and frail general status.and maintain strict control of hypertension with blood pressure goal below 130/90, diabetes with hemoglobin A1c goal below 6.5% and lipids with LDL cholesterol goal below 70 mg/dL. She was advised to continue ongoing home physical, occupational and speech therapy.I also advised the patient to eat a healthy diet with plenty of whole grains, cereals, fruits and vegetables, exercise regularly and maintain ideal body weight.she was encouraged to use a walker at all times and fall and safety prevention precautions were discussed.Greater than 50% time during this 45 minute consultation visit was spent on counseling and coordination of care about her lacunar infarct, atrial fibrillation, discussion about risk benefit of anticoagulation and answering questions.Followup in the future with me only as necessary and no scheduled appointment was  made Antony Contras, MD  [facility] [address] [place], [place] [address]  Phone [phone] Fax [phone] Note: This document was prepared with digital dictation and possible smart phrase technology. Any transcriptional errors that result from this process are unintentional.

## 2017-10-23 ENCOUNTER — Ambulatory Visit: Payer: Medicare Other | Admitting: Podiatry

## 2017-10-23 ENCOUNTER — Encounter: Payer: Self-pay | Admitting: Podiatry

## 2017-10-23 DIAGNOSIS — M79674 Pain in right toe(s): Secondary | ICD-10-CM | POA: Diagnosis not present

## 2017-10-23 DIAGNOSIS — M79675 Pain in left toe(s): Secondary | ICD-10-CM

## 2017-10-23 DIAGNOSIS — B351 Tinea unguium: Secondary | ICD-10-CM | POA: Diagnosis not present

## 2017-10-23 NOTE — Progress Notes (Signed)
Complaint:  Visit Type: Patient returns to my office for continued preventative foot care services. Complaint: Patient states" my nails have grown long and thick and become painful to walk and wear shoes"  The patient presents for preventative foot care services. No changes to ROS  Podiatric Exam: Vascular: dorsalis pedis and posterior tibial pulses are palpable bilateral. Capillary return is immediate. Temperature gradient is WNL. Skin turgor WNL  Sensorium: Normal Semmes Weinstein monofilament test. Normal tactile sensation bilaterally. Nail Exam: Pt has thick disfigured discolored nails with subungual debris noted bilateral entire nail hallux through fifth toenails Ulcer Exam: There is no evidence of ulcer or pre-ulcerative changes or infection. Orthopedic Exam: Muscle tone and strength are WNL. No limitations in general ROM. No crepitus or effusions noted.  HAV  B/L. Skin: No Porokeratosis. No infection or ulcers.  Atropic skin noted.  Diagnosis:  Onychomycosis, , Pain in right toe, pain in left toes  Treatment & Plan Procedures and Treatment: Consent by patient was obtained for treatment procedures. The patient understood the discussion of treatment and procedures well. All questions were answered thoroughly reviewed. Debridement of mycotic and hypertrophic toenails, 1 through 5 bilateral and clearing of subungual debris. No ulceration, no infection noted.  Return Visit-Office Procedure: Patient instructed to return to the office for a follow up visit 3 months for continued evaluation and treatment.    Gardiner Barefoot DPM

## 2017-11-13 ENCOUNTER — Ambulatory Visit: Payer: Medicare Other | Admitting: Podiatry

## 2017-11-26 ENCOUNTER — Ambulatory Visit
Admission: RE | Admit: 2017-11-26 | Discharge: 2017-11-26 | Disposition: A | Discharge status: Home / Self Care | Payer: Medicare Other | Source: Ambulatory Visit | Attending: Internal Medicine | Admitting: Internal Medicine

## 2017-11-26 ENCOUNTER — Other Ambulatory Visit: Payer: Self-pay | Admitting: Internal Medicine

## 2017-11-26 DIAGNOSIS — R059 Cough, unspecified: Secondary | ICD-10-CM

## 2017-11-26 DIAGNOSIS — R05 Cough: Secondary | ICD-10-CM

## 2017-12-25 ENCOUNTER — Ambulatory Visit
Admission: RE | Admit: 2017-12-25 | Discharge: 2017-12-25 | Disposition: A | Discharge status: Home / Self Care | Payer: Medicare Other | Source: Ambulatory Visit | Attending: Internal Medicine | Admitting: Internal Medicine

## 2017-12-25 ENCOUNTER — Other Ambulatory Visit: Payer: Self-pay | Admitting: Internal Medicine

## 2017-12-25 DIAGNOSIS — J42 Unspecified chronic bronchitis: Secondary | ICD-10-CM

## 2017-12-25 DIAGNOSIS — J189 Pneumonia, unspecified organism: Secondary | ICD-10-CM

## 2018-01-17 ENCOUNTER — Encounter: Payer: Self-pay | Admitting: Podiatry

## 2018-01-17 ENCOUNTER — Ambulatory Visit: Payer: Medicare Other | Admitting: Podiatry

## 2018-01-17 DIAGNOSIS — M79674 Pain in right toe(s): Secondary | ICD-10-CM | POA: Diagnosis not present

## 2018-01-17 DIAGNOSIS — L600 Ingrowing nail: Secondary | ICD-10-CM

## 2018-01-17 DIAGNOSIS — M79675 Pain in left toe(s): Secondary | ICD-10-CM | POA: Diagnosis not present

## 2018-01-17 DIAGNOSIS — B351 Tinea unguium: Secondary | ICD-10-CM

## 2018-01-18 ENCOUNTER — Encounter: Payer: Self-pay | Admitting: Podiatry

## 2018-01-18 NOTE — Progress Notes (Signed)
Subjective: Patient presents today with cc of painful, discolored, thick toenails which interfere with activities of daily living. Daughter is present during the visit. She states her Mom has a painful ingrown toenail left great toe. Denies any drainage or swelling.  Pain is aggravated when wearing enclosed shoe gear. Pain is getting progressively worse and relieved with periodic professional debridement.  Objective: Vascular Examination: Capillary refill time immediate x 10 digits Dorsalis pedis and posterior tibial pulses present b/l No digital hair x 10 digits Skin temperature warm to warm b/l  Dermatological Examination: Skin thin and atrophic b/l Toenails 1-5 b/l discolored, thick, dystrophic with subungual debris and pain with palpation to nailbeds due to thickness of nails. Incurvated nail plates noted lateral borders b/l great toes. No hyperkeratoses, no ulcers.   Musculoskeletal: Muscle strength 5/5 to all LE muscle groups Hallux valgus with bunion deformity b/l  Neurological: Sensation intact with 10 gram monofilament. Vibratory sensation intact.  Assessment: Painful onychomycosis toenails 1-5 b/l  Ingrown toenail b/l great toes  Plan: 1. Toenails 1-5 b/l were debrided in length and girth without iatrogenic bleeding. Offending nail borders debrided and curretaged. TAO applied to both great toes. No further treatment required. 2. Patient to continue soft, supportive shoe gear 3. Patient to report any pedal injuries to medical professional immediately. 4. Follow up 3 months. Patient/POA to call should there be a concern in the interim.

## 2018-01-24 ENCOUNTER — Ambulatory Visit: Payer: Medicare Other | Admitting: Podiatry

## 2018-04-18 ENCOUNTER — Encounter: Payer: Self-pay | Admitting: Podiatry

## 2018-04-18 ENCOUNTER — Ambulatory Visit: Payer: Medicare Other | Admitting: Podiatry

## 2018-04-18 DIAGNOSIS — B351 Tinea unguium: Secondary | ICD-10-CM | POA: Diagnosis not present

## 2018-04-18 DIAGNOSIS — M79675 Pain in left toe(s): Secondary | ICD-10-CM

## 2018-04-18 DIAGNOSIS — M79674 Pain in right toe(s): Secondary | ICD-10-CM | POA: Diagnosis not present

## 2018-04-21 ENCOUNTER — Encounter: Payer: Self-pay | Admitting: Podiatry

## 2018-04-21 NOTE — Progress Notes (Signed)
Subjective: Brandy Huff is here for routine follow up of painful, discolored, thick toenails. Pain is aggravated when wearing enclosed shoe gear. Pain isrelieved with periodic professional debridement.  Objective: Unchanged from last visit. Vascular Examination: Capillary refill time immediate x 10 digits Dorsalis pedis and posterior tibial pulses present b/l No digital hair x 10 digits Skin temperature warm to warm b/l  Dermatological Examination: Skin thin and atrophic b/l Toenails 1-5 b/l discolored, thick, dystrophic with subungual debris and pain with palpation to nailbeds due to thickness of nails. Incurvated nail plates noted lateral borders b/l great toes. No hyperkeratoses, no ulcers. No interdigital maceration.   Musculoskeletal: Muscle strength 5/5 to all LE muscle groups Hallux valgus with bunion deformity b/l  Neurological: Sensation intact with 10 gram monofilament. Vibratory sensation intact.  Assessment: Painful onychomycosis toenails 1-5 b/l  Ingrown toenail b/l great toes  Plan: 1. Toenails 1-5 b/l were debrided in length and girth without iatrogenic bleeding. Offending nail borders debrided and curretaged. TAO applied to both great toes.  2. Patient to continue soft, supportive shoe gear 3. Patient to report any pedal injuries to medical professional immediately. 4. Follow up 3 months. Patient/POA to call should there be a concern in the interim.

## 2018-07-11 ENCOUNTER — Encounter: Payer: Self-pay | Admitting: Podiatry

## 2018-07-11 ENCOUNTER — Ambulatory Visit: Payer: Medicare Other | Admitting: Podiatry

## 2018-07-11 DIAGNOSIS — B351 Tinea unguium: Secondary | ICD-10-CM

## 2018-07-11 DIAGNOSIS — M79674 Pain in right toe(s): Secondary | ICD-10-CM | POA: Diagnosis not present

## 2018-07-11 DIAGNOSIS — M79675 Pain in left toe(s): Secondary | ICD-10-CM

## 2018-07-11 NOTE — Patient Instructions (Signed)

## 2018-08-02 ENCOUNTER — Encounter: Payer: Self-pay | Admitting: Podiatry

## 2018-08-02 NOTE — Progress Notes (Addendum)
Subjective: Brandy Huff presents today with painful, thick toenails 1-5 b/l that she cannot cut and which interfere with daily activities.  Pain is aggravated when wearing enclosed shoe gear.  Brandy Low, MD is her PCP.   Current Outpatient Medications:  .  acetaminophen (TYLENOL) 325 MG tablet, Take 325 mg by mouth every 6 (six) hours as needed (for pain or headaches)., Disp: , Rfl:  .  amLODipine (NORVASC) 5 MG tablet, Take 1 tablet (5 mg total) by mouth daily., Disp: 30 tablet, Rfl: 0 .  amoxicillin-clavulanate (AUGMENTIN) 875-125 MG tablet, , Disp: , Rfl:  .  aspirin EC 325 MG tablet, Take 325 mg by mouth daily., Disp: , Rfl:  .  dorzolamide-timolol (COSOPT) 22.3-6.8 MG/ML ophthalmic solution, Place 1 drop into the right eye 2 (two) times daily., Disp: , Rfl:  .  hydrocortisone 2.5 % cream, Apply 1 application topically every other day. UNDER THE LEFT BREAST, Disp: , Rfl:  .  levothyroxine (SYNTHROID, LEVOTHROID) 112 MCG tablet, Take 112 mcg by mouth daily before breakfast., Disp: , Rfl:  .  loratadine (CLARITIN) 10 MG tablet, Take 10 mg by mouth daily as needed for allergies., Disp: , Rfl:  .  losartan (COZAAR) 100 MG tablet, Take 100 mg by mouth daily., Disp: , Rfl:  .  metoprolol succinate (TOPROL-XL) 25 MG 24 hr tablet, Take 1 tablet (25 mg total) by mouth 2 (two) times daily., Disp: , Rfl:  .  metoprolol succinate (TOPROL-XL) 25 MG 24 hr tablet, TAKE 2 TABLETS BY MOUTH ONCE DAILY, Disp: , Rfl: 1 .  nystatin cream (MYCOSTATIN), Apply 1 application topically every other day. UNDER THE LEFT BREAST, Disp: , Rfl:  .  Polyethyl Glyc-Propyl Glyc PF (SYSTANE ULTRA PF) 0.4-0.3 % SOLN, Place 1 drop into the left eye 2 (two) times daily., Disp: , Rfl:  .  predniSONE (DELTASONE) 20 MG tablet, , Disp: , Rfl:   Allergies  Allergen Reactions  . Quinolones Other (See Comments)    AMS > noted with Levofloxacin and  (hallucinations and "took 6 weeks to get this out of her system"  .  Adhesive [Tape] Other (See Comments)    Tape TEARS THE SKIN BECAUSE IT IS VERY THIN!!!!  . Levaquin [Levofloxacin In D5w] Other (See Comments)    Altered Mental Status, admitted to hospital with CNS toxicity    Objective:  Vascular Examination: Capillary refill time immediate x 10 digits Dorsalis pedis and Posterior tibial pulses palpable b/l Digital hair absent x 10 digits Skin temperature gradient WNL b/l  Dermatological Examination: Skin thin and atrophic b/l  Toenails 1-5 b/l discolored, thick, dystrophic with subungual debris and pain with palpation to nailbeds due to thickness of nails.  No hyperkeratosis, no open wounds b/l  Musculoskeletal: Muscle strength 5/5 to all LE muscle groups  HAV with bunion b/l   Neurological: Sensation intact with 10 gram monofilament. Vibratory sensation intact.  Assessment: Painful onychomycosis toenails 1-5 b/l   Plan: 1. Toenails 1-5 b/l were debrided in length and girth without iatrogenic bleeding. 2. Patient to continue soft, supportive shoe gear 3. Patient to report any pedal injuries to medical professional immediately. 4. Follow up 3 months. Patient/POA to call should there be a concern in the interim.

## 2018-10-10 ENCOUNTER — Ambulatory Visit: Payer: Medicare Other | Admitting: Podiatry

## 2018-10-10 ENCOUNTER — Encounter: Payer: Self-pay | Admitting: Podiatry

## 2018-10-10 DIAGNOSIS — M79675 Pain in left toe(s): Secondary | ICD-10-CM

## 2018-10-10 DIAGNOSIS — M79674 Pain in right toe(s): Secondary | ICD-10-CM

## 2018-10-10 DIAGNOSIS — B351 Tinea unguium: Secondary | ICD-10-CM | POA: Diagnosis not present

## 2018-10-10 NOTE — Patient Instructions (Signed)

## 2018-10-18 ENCOUNTER — Encounter: Payer: Self-pay | Admitting: Podiatry

## 2018-10-18 NOTE — Progress Notes (Signed)
Subjective: Brandy Huff presents today with painful, thick toenails 1-5 b/l that she cannot cut and which interfere with daily activities.  Pain is aggravated when wearing enclosed shoe gear and is relieved with periodic professional debridement.  She is accompanied by her daughter on today.  She nor her daughter voiced any new pedal concerns on today's visit.  [name], MD is her PCP.   Current Outpatient Medications:  .  acetaminophen (TYLENOL) 325 MG tablet, Take 325 mg by mouth every 6 (six) hours as needed (for pain or headaches)., Disp: , Rfl:  .  amLODipine (NORVASC) 5 MG tablet, Take 1 tablet (5 mg total) by mouth daily., Disp: 30 tablet, Rfl: 0 .  amoxicillin-clavulanate (AUGMENTIN) 875-125 MG tablet, , Disp: , Rfl:  .  aspirin EC 325 MG tablet, Take 325 mg by mouth daily., Disp: , Rfl:  .  dorzolamide-timolol (COSOPT) 22.3-6.8 MG/ML ophthalmic solution, Place 1 drop into the right eye 2 (two) times daily., Disp: , Rfl:  .  hydrocortisone 2.5 % cream, Apply 1 application topically every other day. UNDER THE LEFT BREAST, Disp: , Rfl:  .  levothyroxine (SYNTHROID, LEVOTHROID) 112 MCG tablet, Take 112 mcg by mouth daily before breakfast., Disp: , Rfl:  .  loratadine (CLARITIN) 10 MG tablet, Take 10 mg by mouth daily as needed for allergies., Disp: , Rfl:  .  losartan (COZAAR) 100 MG tablet, Take 100 mg by mouth daily., Disp: , Rfl:  .  metoprolol succinate (TOPROL-XL) 25 MG 24 hr tablet, Take 1 tablet (25 mg total) by mouth 2 (two) times daily., Disp: , Rfl:  .  metoprolol succinate (TOPROL-XL) 25 MG 24 hr tablet, TAKE 2 TABLETS BY MOUTH ONCE DAILY, Disp: , Rfl: 1 .  nystatin cream (MYCOSTATIN), Apply 1 application topically every other day. UNDER THE LEFT BREAST, Disp: , Rfl:  .  Polyethyl Glyc-Propyl Glyc PF (SYSTANE ULTRA PF) 0.4-0.3 % SOLN, Place 1 drop into the left eye 2 (two) times daily., Disp: , Rfl:  .  predniSONE (DELTASONE) 20 MG tablet, , Disp: , Rfl:    Allergies  Allergen Reactions  . Quinolones Other (See Comments)    AMS > noted with Levofloxacin and  (hallucinations and "took 6 weeks to get this out of her system"  . Adhesive [Tape] Other (See Comments)    Tape TEARS THE SKIN BECAUSE IT IS VERY THIN!!!!  . Levaquin [Levofloxacin In D5w] Other (See Comments)    Altered Mental Status, admitted to hospital with CNS toxicity    Objective:  Vascular Examination: Capillary refill time immediate x 10 digits  Dorsalis pedis and Posterior tibial pulses palpable b/l  Digital hair absent x 10 digits  Skin temperature gradient WNL b/l  Dermatological Examination: Pedal skin is thin, shiny, and atrophic bilaterally.  Toenails 1-5 b/l discolored, thick, dystrophic with subungual debris and pain with palpation to nailbeds due to thickness of nails.  No hyperkeratotic lesions.  No open wounds.  Musculoskeletal: Muscle strength 5/5 to all LE muscle groups  HAV with bunion deformity noted bilaterally.  No pain, crepitus or joint limitation noted with ROM.   Neurological: Sensation intact with 10 gram monofilament.  Vibratory sensation intact.  Assessment: Painful onychomycosis toenails 1-5 b/l   Plan: 1. Toenails 1-5 b/l were debrided in length and girth without iatrogenic bleeding. 2. Patient to continue soft, supportive shoe gear 3. Patient to report any pedal injuries to medical professional immediately. 4. Follow up 3 months.  5. Patient/POA to call should  there be a concern in the interim.

## 2019-01-08 ENCOUNTER — Ambulatory Visit: Payer: Medicare Other | Admitting: Podiatry

## 2019-01-21 ENCOUNTER — Encounter: Payer: Self-pay | Admitting: Podiatry

## 2019-01-21 ENCOUNTER — Other Ambulatory Visit: Payer: Self-pay

## 2019-01-21 ENCOUNTER — Ambulatory Visit: Payer: Medicare Other | Admitting: Podiatry

## 2019-01-21 VITALS — Temp 98.2°F

## 2019-01-21 DIAGNOSIS — M79675 Pain in left toe(s): Secondary | ICD-10-CM | POA: Diagnosis not present

## 2019-01-21 DIAGNOSIS — M79674 Pain in right toe(s): Secondary | ICD-10-CM | POA: Diagnosis not present

## 2019-01-21 DIAGNOSIS — B351 Tinea unguium: Secondary | ICD-10-CM | POA: Diagnosis not present

## 2019-01-21 NOTE — Patient Instructions (Signed)

## 2019-01-25 NOTE — Progress Notes (Signed)
Subjective:  Brandy Huff presents to clinic today with cc of  painful, thick, discolored, elongated toenails 1-5 b/l that become tender and cannot cut because of thickness.  Pain is aggravated when wearing enclosed shoe gear.  Brandy Low, MD is her PCP.   Her daughter is present during the visit.They voice no new pedal concerns on today's visit.   Current Outpatient Medications:  .  acetaminophen (TYLENOL) 325 MG tablet, Take 325 mg by mouth every 6 (six) hours as needed (for pain or headaches)., Disp: , Rfl:  .  amLODipine (NORVASC) 5 MG tablet, Take 1 tablet (5 mg total) by mouth daily., Disp: 30 tablet, Rfl: 0 .  amoxicillin-clavulanate (AUGMENTIN) 875-125 MG tablet, , Disp: , Rfl:  .  aspirin EC 325 MG tablet, Take 325 mg by mouth daily., Disp: , Rfl:  .  dorzolamide-timolol (COSOPT) 22.3-6.8 MG/ML ophthalmic solution, Place 1 drop into the right eye 2 (two) times daily., Disp: , Rfl:  .  hydrocortisone 2.5 % cream, Apply 1 application topically every other day. UNDER THE LEFT BREAST, Disp: , Rfl:  .  levothyroxine (SYNTHROID, LEVOTHROID) 112 MCG tablet, Take 112 mcg by mouth daily before breakfast., Disp: , Rfl:  .  loratadine (CLARITIN) 10 MG tablet, Take 10 mg by mouth daily as needed for allergies., Disp: , Rfl:  .  losartan (COZAAR) 100 MG tablet, Take 100 mg by mouth daily., Disp: , Rfl:  .  metoprolol succinate (TOPROL-XL) 25 MG 24 hr tablet, Take 1 tablet (25 mg total) by mouth 2 (two) times daily., Disp: , Rfl:  .  metoprolol succinate (TOPROL-XL) 25 MG 24 hr tablet, TAKE 2 TABLETS BY MOUTH ONCE DAILY, Disp: , Rfl: 1 .  nystatin cream (MYCOSTATIN), Apply 1 application topically every other day. UNDER THE LEFT BREAST, Disp: , Rfl:  .  Polyethyl Glyc-Propyl Glyc PF (SYSTANE ULTRA PF) 0.4-0.3 % SOLN, Place 1 drop into the left eye 2 (two) times daily., Disp: , Rfl:  .  predniSONE (DELTASONE) 20 MG tablet, , Disp: , Rfl:    Allergies  Allergen Reactions  . Quinolones  Other (See Comments)    AMS > noted with Levofloxacin and  (hallucinations and "took 6 weeks to get this out of her system"  . Adhesive [Tape] Other (See Comments)    Tape TEARS THE SKIN BECAUSE IT IS VERY THIN!!!!  . Levaquin [Levofloxacin In D5w] Other (See Comments)    Altered Mental Status, admitted to hospital with CNS toxicity     Objective: Vitals:   01/21/19 1327  Temp: 98.2 F (36.8 C)    Physical Examination:  Vascular Examination: Capillary refill time immediate x 10 digits.  Palpable DP/PT pulses b/l.  Digital hair absent b/l.  Trace pedal edema BLE.   Skin temperature gradient WNL b/l.  Dermatological Examination: Pedal skin is thin, shiny and atrophic b/l.   No open wounds b/l.  No interdigital macerations noted b/l.  Incurvated nailplate lateral border left hallux with tenderness to palpation. No erythema, no edema, no drainage noted.  Elongated, thick, discolored brittle toenails with subungual debris and pain on dorsal palpation of nailbeds 1-5 b/l.  Musculoskeletal Examination: Muscle strength 5/5 to all muscle groups b/l.  HAV with bunion deformity b/l.  No pain, crepitus or joint discomfort with active/passive ROM.  Neurological Examination: Sensation intact 5/5 b/l with 10 gram monofilament.  Vibratory sensation intact b/l.  Assessment: Mycotic nail infection with pain 1-5 b/l  Plan: 1. Toenails 1-5 b/l were debrided in  length and girth without iatrogenic laceration. Offending nail border debrided and curretaged left great toe.  Border cleansed with alcohol. Antibiotic ointment applied. No further treatment required by patient. 2.  Continue soft, supportive shoe gear daily. 3.  Report any pedal injuries to medical professional. 4.  Follow up 3 months. 5.  Patient/POA to call should there be a question/concern in there interim.

## 2019-02-16 IMAGING — DX DG CHEST 2V
2 series · 2 of 2 positions shown · non-contrast
Comparison: 09/07/2011 chest radiograph.

CLINICAL DATA: [AGE]/o  F; productive cough, fever, and ileus.

EXAM:
CHEST  2 VIEW

[chest pa]
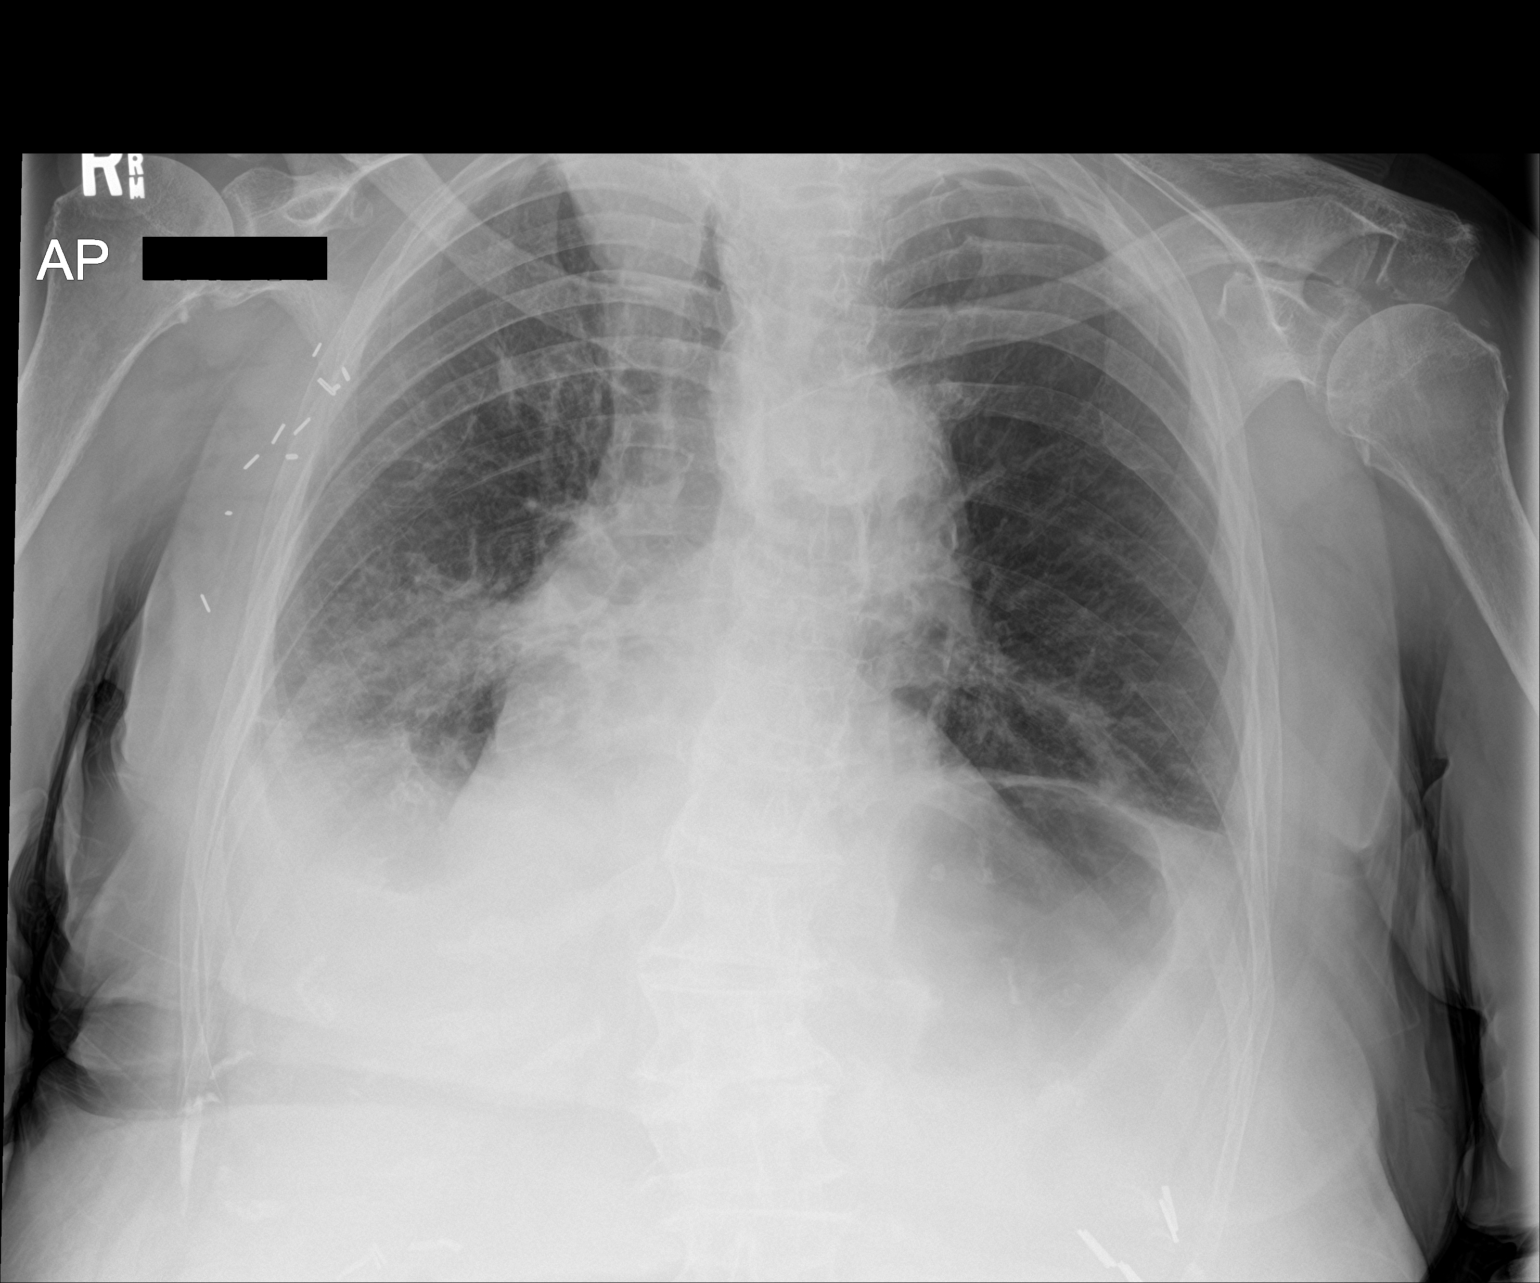

[chest lat]
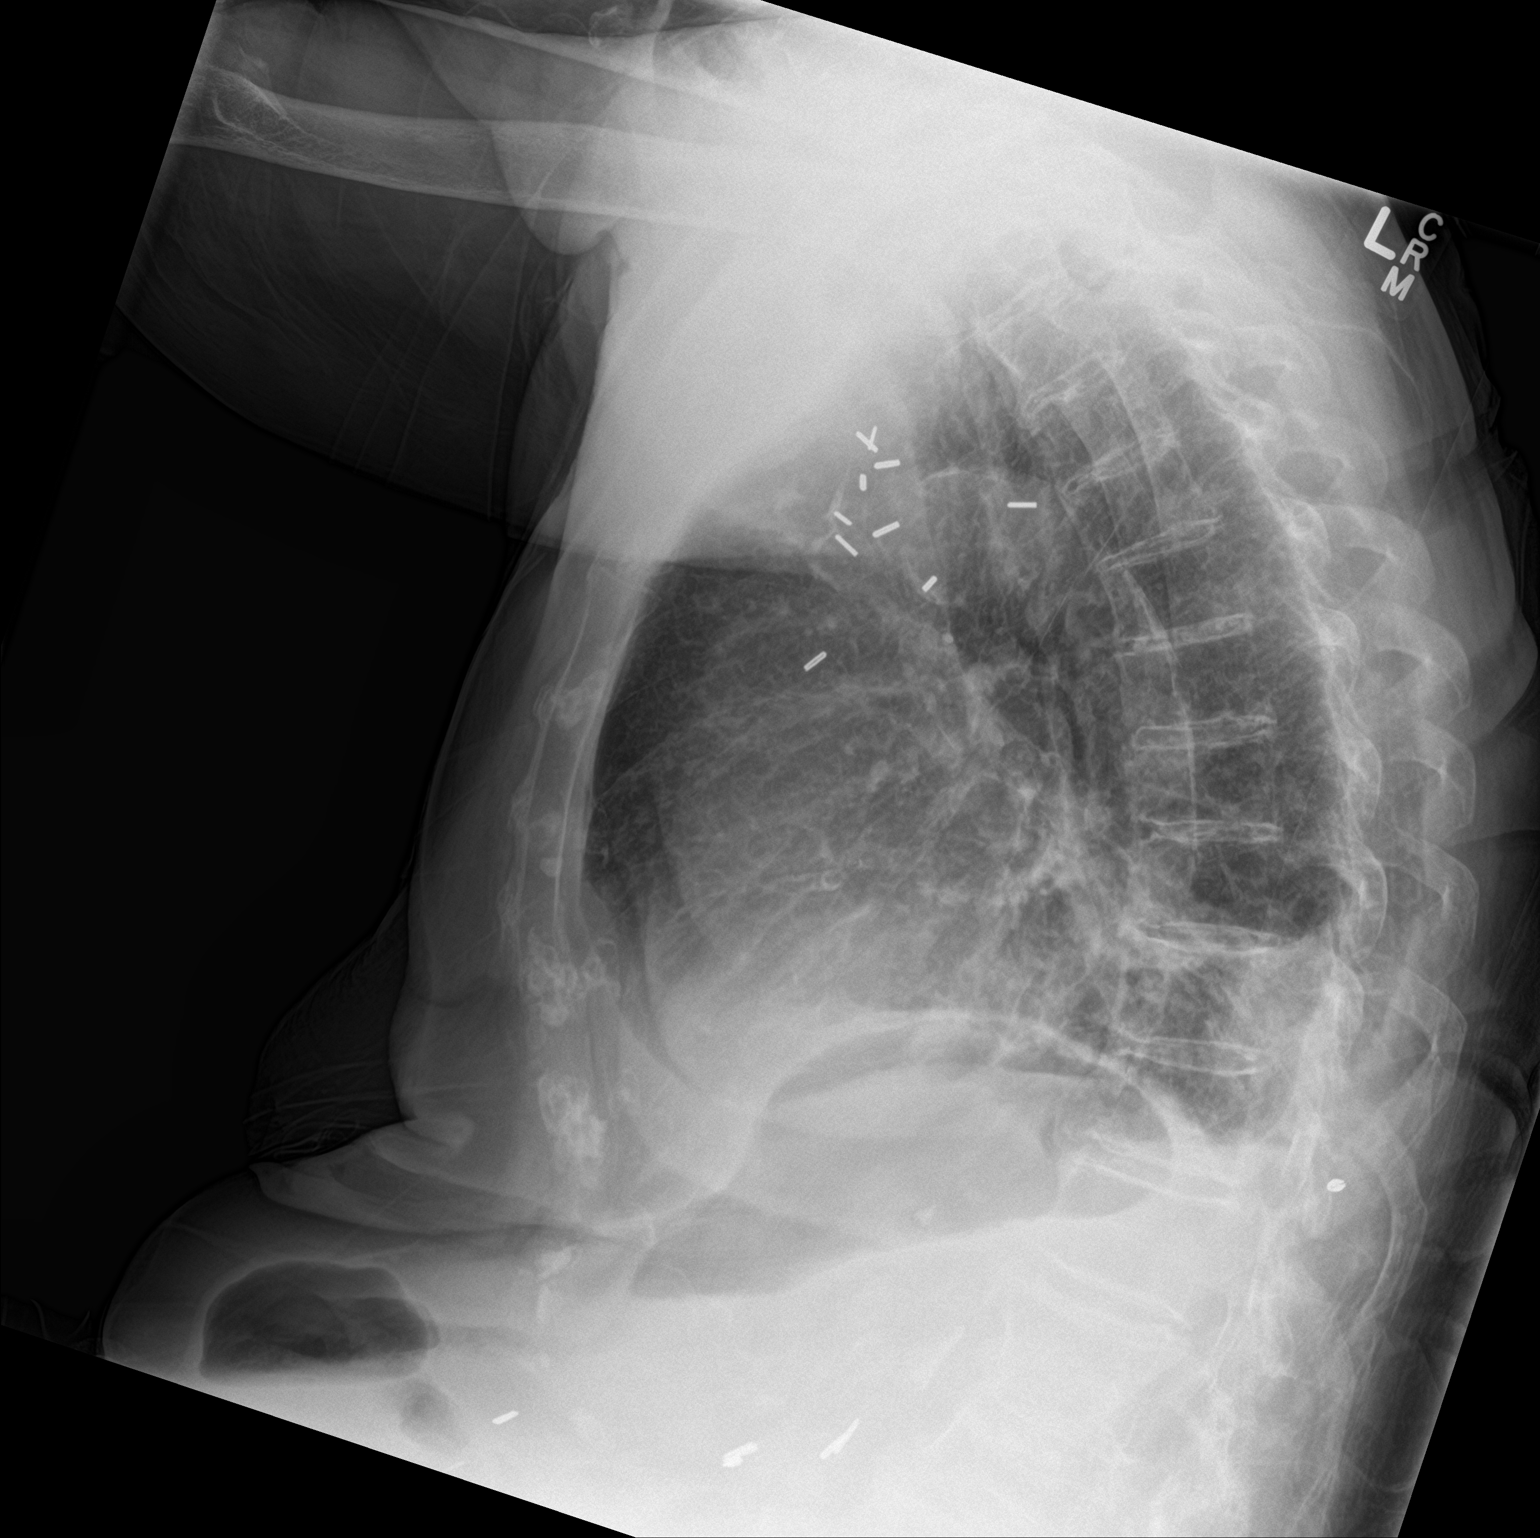

[2 of 2 positions shown; findings below may reference images not displayed]

FINDINGS: Right middle and right greater than left lower lobe consolidations.
Small right and trace left pleural effusions. Stable cardiac
silhouette given projection and technique. Aortic atherosclerosis
with calcification. Reverse S curvature of the thoracolumbar spine.
Right maxillary and left upper quadrant surgical clips.
IMPRESSION: Right middle and right greater than left lower lobe pneumonia with
small effusions.

These results will be called to the ordering clinician or
representative by the Radiologist Assistant, and communication
documented in the PACS or zVision Dashboard.

By: Eglimbert Lalit M.D.

## 2019-04-09 IMAGING — DX DG CHEST 1V PORT
1 series · 1 of 1 positions shown · non-contrast
Comparison: December 17, 2016

CLINICAL DATA: Altered mental status.  Recent pneumonia

EXAM:
PORTABLE CHEST 1 VIEW

[chest ap]
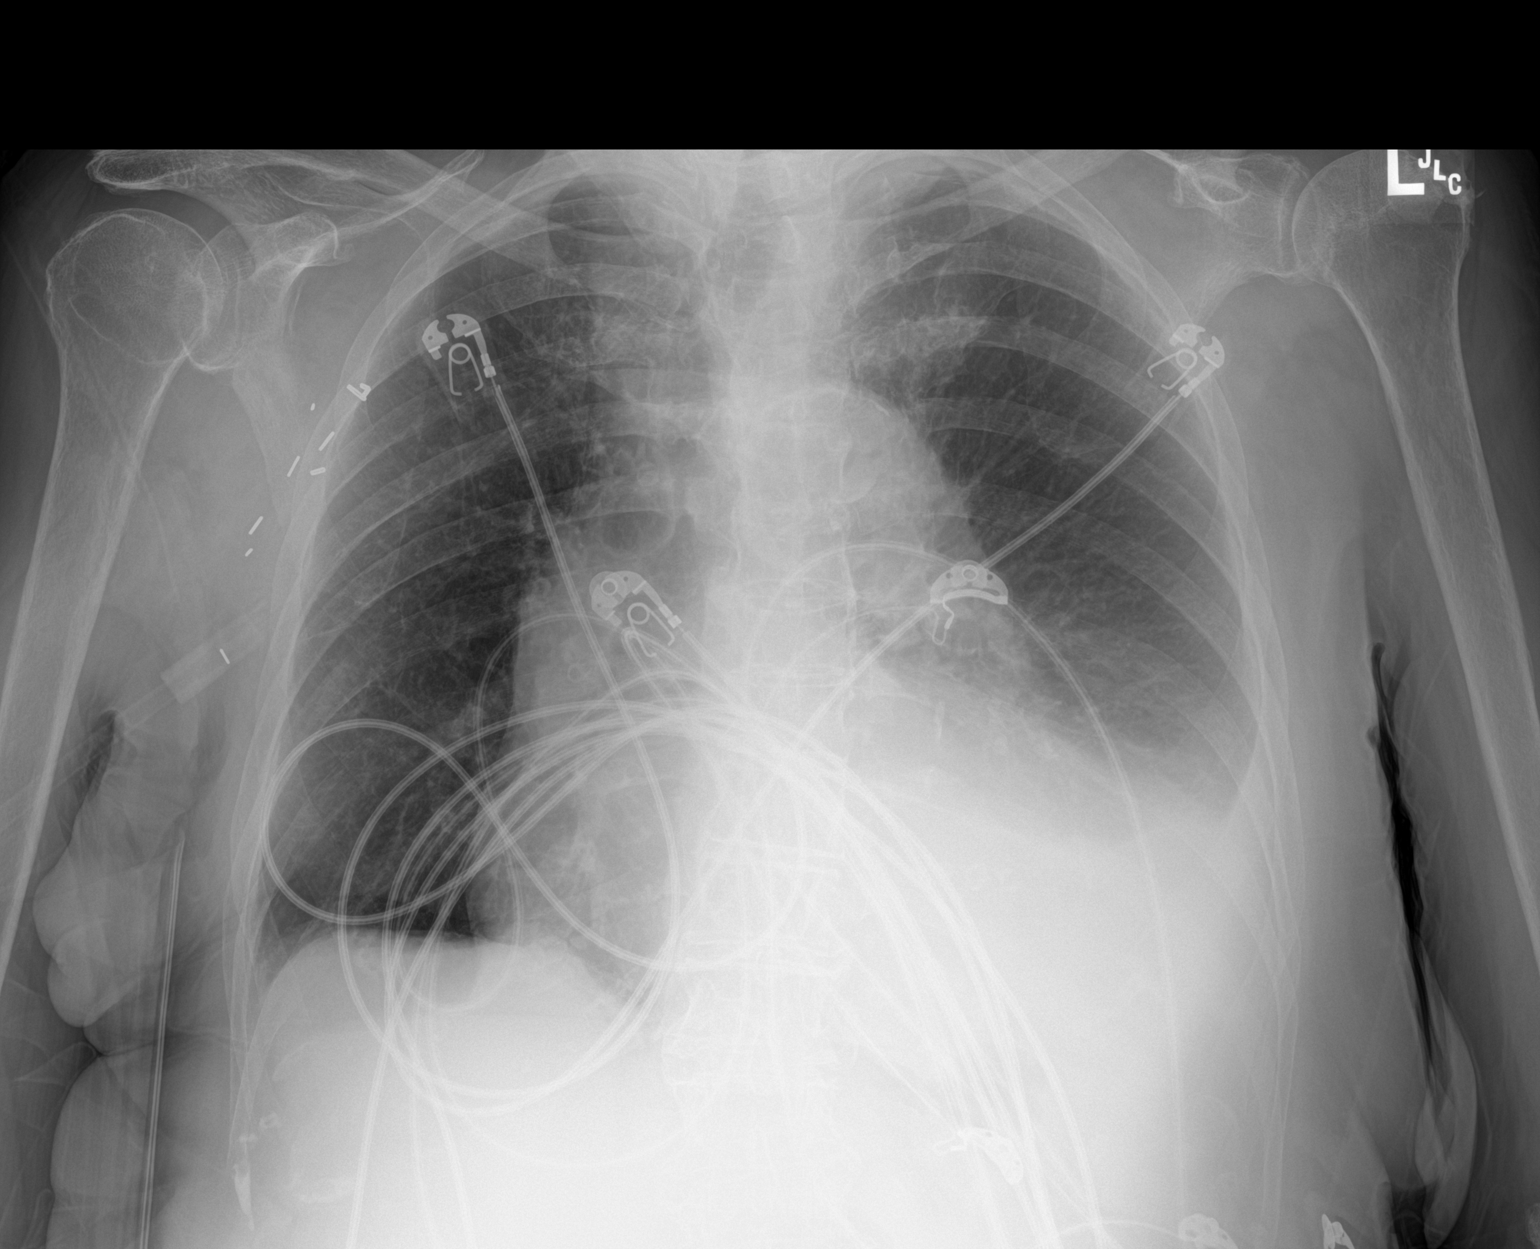

[1 of 1 positions shown; findings below may reference images not displayed]

FINDINGS: There is airspace consolidation in the left lower lobe with left
pleural effusion. Right lung is clear. There is cardiomegaly with
pulmonary vascularity within normal limits. There is aortic
atherosclerosis. No adenopathy. The patient is status post right
mastectomy with surgical clips in the right axillary region. No
evident bone lesions.
IMPRESSION: Persistent left lower lobe airspace consolidation with left
effusion. No new opacity. Stable cardiac prominence. Aortic
atherosclerosis. No adenopathy.

## 2019-04-21 ENCOUNTER — Ambulatory Visit (INDEPENDENT_AMBULATORY_CARE_PROVIDER_SITE_OTHER): Payer: Medicare Other | Admitting: Podiatry

## 2019-04-21 ENCOUNTER — Encounter: Payer: Self-pay | Admitting: Podiatry

## 2019-04-21 ENCOUNTER — Other Ambulatory Visit: Payer: Self-pay

## 2019-04-21 DIAGNOSIS — M79675 Pain in left toe(s): Secondary | ICD-10-CM

## 2019-04-21 DIAGNOSIS — B351 Tinea unguium: Secondary | ICD-10-CM | POA: Diagnosis not present

## 2019-04-21 DIAGNOSIS — M79674 Pain in right toe(s): Secondary | ICD-10-CM

## 2019-04-21 NOTE — Patient Instructions (Signed)

## 2019-04-28 NOTE — Progress Notes (Signed)
Subjective: Brandy Huff is seen today for follow up painful, elongated, thickened toenails 1-5 b/l feet that she cannot cut. Pain interferes with daily activities. Aggravating factor includes wearing enclosed shoe gear and relieved with periodic debridement.  Her daughter is present during today's visit. She voices no new concerns on today's visit.  Objective:  Neurovascular status unchanged: CFT immediate x 10 digits.   DP/PT pulses palpable b/l.  No digital hair x 10 digits.  Trace pedal edema b/l LE.  Sensation intact 5/5 b/l with 10 gram monofilament.   Dermatological Examination: Skin is thin, fragile and atrophic b/l.  Toenails 1-5 b/l discolored, thick, dystrophic with subungual debris and pain with palpation to nailbeds due to thickness of nails.  Musculoskeletal: Muscle strength 5/5 b/l to all LE muscle groups.  HAV with bunion b/l.   No pain, crepitus or joint limitation noted with ROM.   Assessment: Painful onychomycosis toenails 1-5 b/l   Plan: 1. Toenails 1-5 b/l were debrided in length and girth without iatrogenic bleeding. 2. Patient to continue soft, supportive shoe gear 3. Patient to report any pedal injuries to medical professional immediately. 4. Follow up 3 months.  5. Patient/POA to call should there be a concern in the interim.

## 2019-07-28 ENCOUNTER — Encounter: Payer: Self-pay | Admitting: Podiatry

## 2019-07-28 ENCOUNTER — Ambulatory Visit: Payer: Medicare Other | Admitting: Podiatry

## 2019-07-28 ENCOUNTER — Other Ambulatory Visit: Payer: Self-pay

## 2019-07-28 DIAGNOSIS — M79674 Pain in right toe(s): Secondary | ICD-10-CM

## 2019-07-28 DIAGNOSIS — B351 Tinea unguium: Secondary | ICD-10-CM

## 2019-07-28 DIAGNOSIS — M79675 Pain in left toe(s): Secondary | ICD-10-CM

## 2019-07-29 NOTE — Progress Notes (Signed)
Subjective:  Brandy Huff presents to clinic today with cc of  painful, thick, discolored, elongated toenails  of both feet that become tender and patient cannot cut because of thickness. Pain is aggravated when wearing enclosed shoe gear and relieved with periodic professional debridement.  Patient's daughter is present during the visit. She voices no new pedal concerns on today's visit.  Daughter states Brandy Huff has seen Dermatologist and has had several skin cancers removed. She has dressing on anterior aspect of her left leg.  Allergies  Allergen Reactions  . Quinolones Other (See Comments)    AMS > noted with Levofloxacin and  (hallucinations and "took 6 weeks to get this out of her system"  . Adhesive [Tape] Other (See Comments)    Tape TEARS THE SKIN BECAUSE IT IS VERY THIN!!!!  . Levaquin [Levofloxacin In D5w] Other (See Comments)    Altered Mental Status, admitted to hospital with CNS toxicity    Objective:  Physical Examination: Vascular Examination: Capillary refill time immediate x 10 digits.  Dorsalis pedis present b/l.  Posterior tibial pulses present b/l.  Digital hair  present x 10 digits.  Skin temperature gradient WNL b/l.  Trace edema noted b/l LE.  Dermatological Examination: Skin thin, shiny and atrophic b/l.  Multiple areas of bruising b/l LE.  No open wounds b/l.  No interdigital macerations noted b/l.  Elongated, thick, discolored brittle toenails with subungual debris and pain on dorsal palpation of nailbeds 1-5 b/l.  Incurvated nailplate left great toe lateral border with tenderness to palpation. No erythema, no edema, no drainage noted.  Musculoskeletal Examination: Muscle strength 5/5 to all muscle groups b/l.  HAV with bunion b/l.  No pain, crepitus or joint discomfort with active/passive ROM.  Neurological Examination: Sensation intact 5/5 b/l with 10 gram monofilament.  Assessment: Mycotic nail infection with pain 1-5  b/l  Plan: 1.  Toenails 1-5 b/l were debrided in length and girth without iatrogenic laceration. Offending nail border debrided and curretaged left hallux. Border cleansed with alcohol and triple antibiotic applied. Daughter instructed to apply antibiotic ointment to digit once daily for one week. 2.  Continue soft, supportive shoe gear daily. 3.  Report any pedal injuries to medical professional. 4.  Follow up 3 months.  5.  Patient/POA to call should there be a question/concern in there interim.

## 2019-10-26 ENCOUNTER — Ambulatory Visit: Payer: Medicare Other | Admitting: Podiatry

## 2019-12-28 IMAGING — CR DG CHEST 2V
2 series · 2 of 2 positions shown · non-contrast
Comparison: 12/21/2016

CLINICAL DATA: Altered mental status and hypertension.

EXAM:
CHEST  2 VIEW

[chest lat]
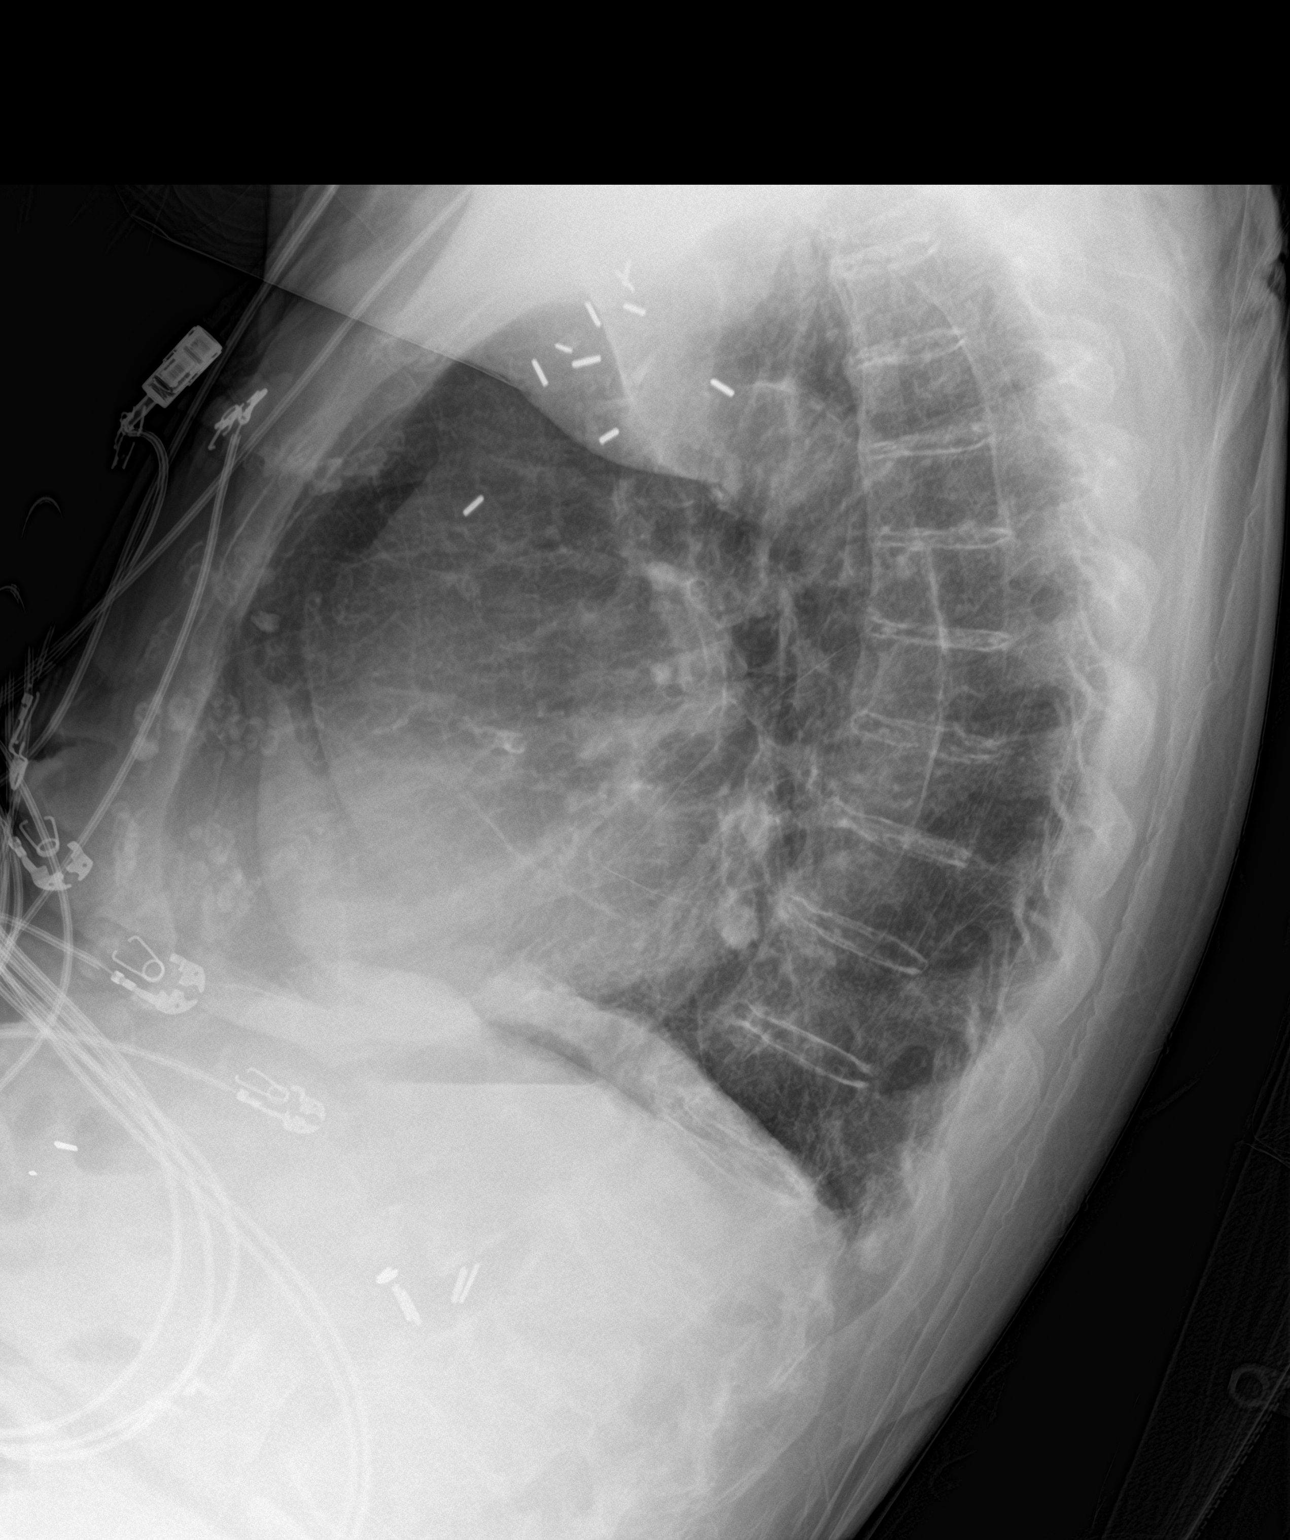

[chest ap]
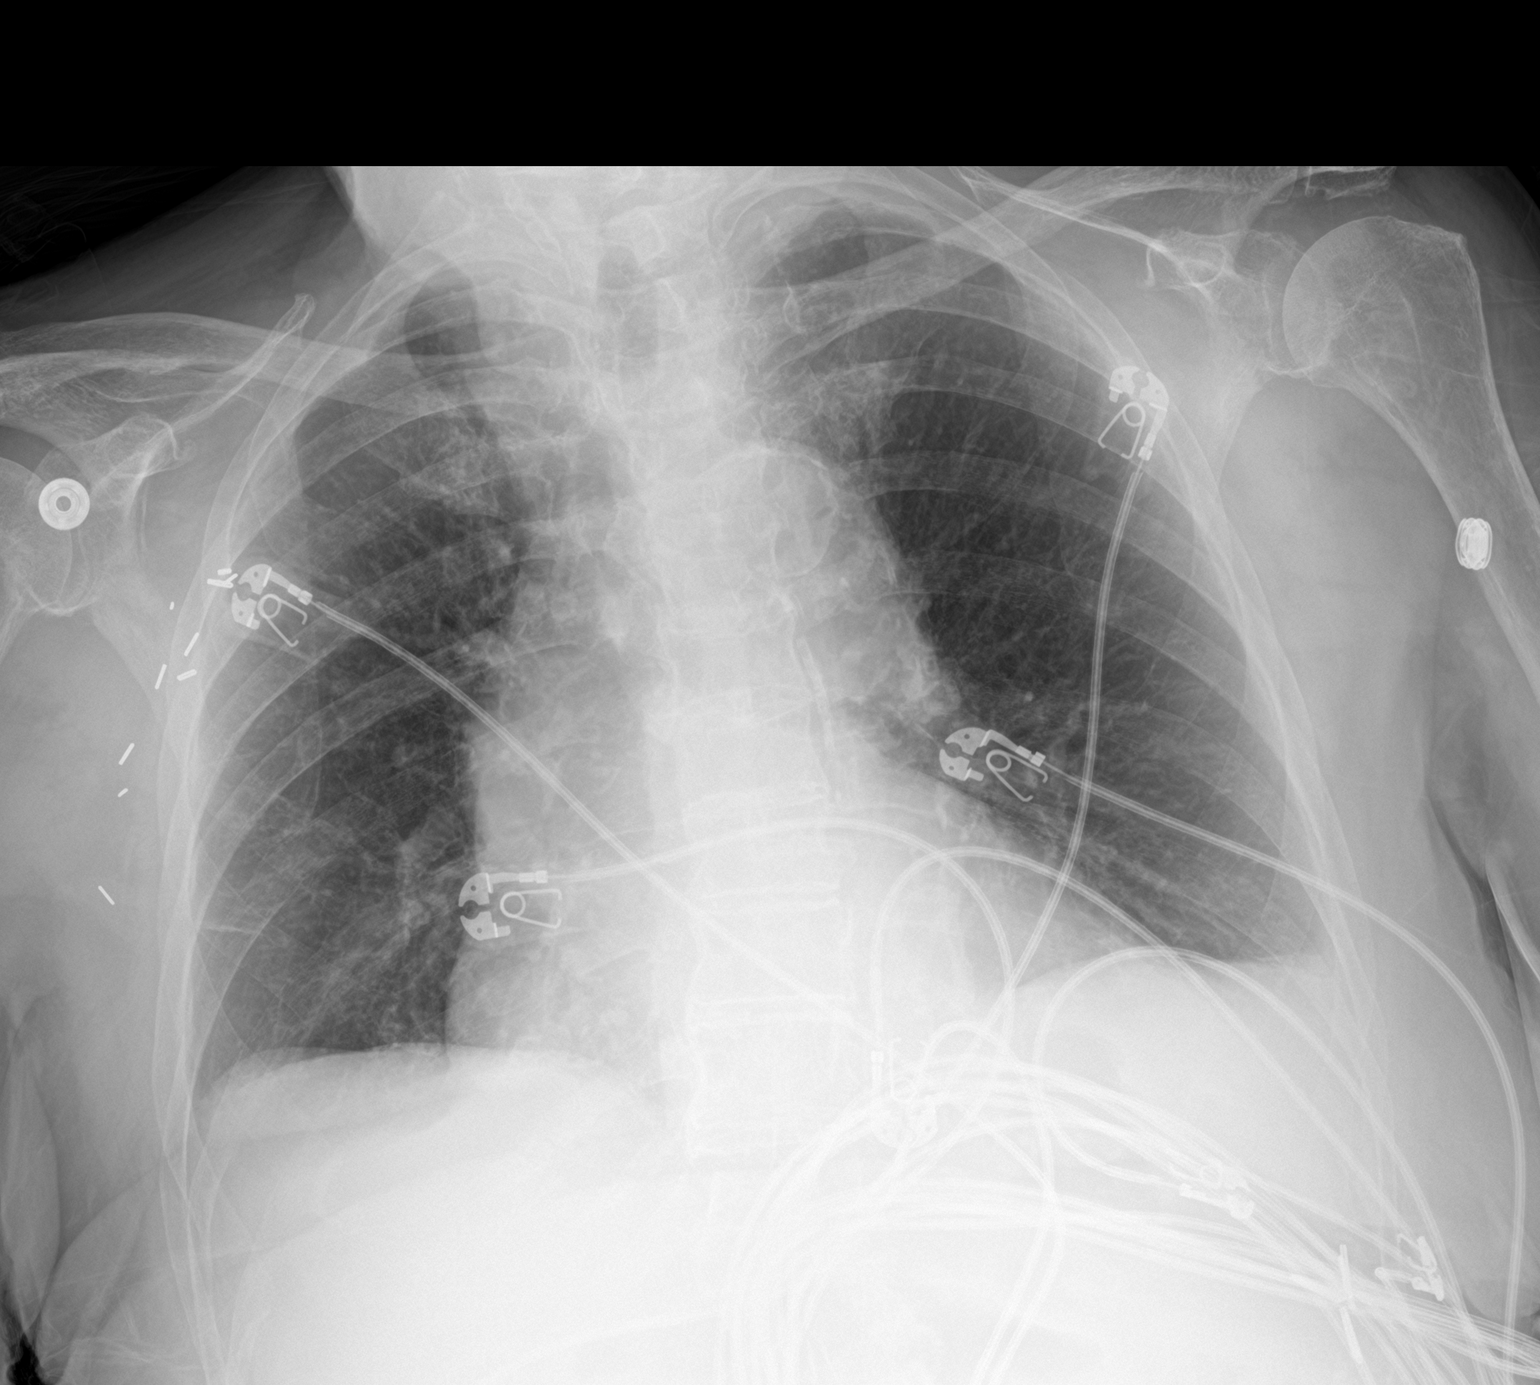

[2 of 2 positions shown; findings below may reference images not displayed]

FINDINGS: Cardiomegaly with aortic atherosclerosis. Resolved left pleural
effusion. No acute pneumonic consolidation or CHF. Surgical clips
project over the right axilla. Mild degenerate change along the
dorsal spine.
IMPRESSION: Stable cardiomegaly with aortic atherosclerosis. No active pulmonary
disease.

## 2021-03-20 DEATH — deceased
